# Patient Record
Sex: Female | Born: 1980
Health system: Southern US, Community
[De-identification: ages and names within clinical notes are randomized; demographics above are authoritative.]

## PROBLEM LIST (undated history)

## (undated) DIAGNOSIS — D649 Anemia, unspecified: Secondary | ICD-10-CM

## (undated) DIAGNOSIS — F32A Depression, unspecified: Secondary | ICD-10-CM

## (undated) DIAGNOSIS — I10 Essential (primary) hypertension: Secondary | ICD-10-CM

## (undated) DIAGNOSIS — N871 Moderate cervical dysplasia: Secondary | ICD-10-CM

## (undated) DIAGNOSIS — R87629 Unspecified abnormal cytological findings in specimens from vagina: Secondary | ICD-10-CM

## (undated) DIAGNOSIS — A64 Unspecified sexually transmitted disease: Secondary | ICD-10-CM

## (undated) DIAGNOSIS — F329 Major depressive disorder, single episode, unspecified: Secondary | ICD-10-CM

## (undated) HISTORY — DX: Essential (primary) hypertension: I10

## (undated) HISTORY — DX: Major depressive disorder, single episode, unspecified: F32.9

## (undated) HISTORY — PX: TUBAL LIGATION: SHX77

## (undated) HISTORY — DX: Unspecified sexually transmitted disease: A64

## (undated) HISTORY — DX: Moderate cervical dysplasia: N87.1

## (undated) HISTORY — DX: Depression, unspecified: F32.A

## (undated) HISTORY — DX: Unspecified abnormal cytological findings in specimens from vagina: R87.629

---
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
## 2001-05-26 ENCOUNTER — Emergency Department (HOSPITAL_COMMUNITY): Admission: EM | Admit: 2001-05-26 | Discharge: 2001-05-26 | Payer: Self-pay | Admitting: Emergency Medicine

## 2001-09-26 ENCOUNTER — Other Ambulatory Visit: Admission: RE | Admit: 2001-09-26 | Discharge: 2001-09-26 | Payer: Self-pay | Admitting: Internal Medicine

## 2002-07-24 ENCOUNTER — Emergency Department (HOSPITAL_COMMUNITY): Admission: EM | Admit: 2002-07-24 | Discharge: 2002-07-24 | Payer: Self-pay | Admitting: Emergency Medicine

## 2002-12-13 ENCOUNTER — Emergency Department (HOSPITAL_COMMUNITY): Admission: EM | Admit: 2002-12-13 | Discharge: 2002-12-14 | Payer: Self-pay | Admitting: Emergency Medicine

## 2002-12-14 ENCOUNTER — Ambulatory Visit (HOSPITAL_COMMUNITY): Admission: RE | Admit: 2002-12-14 | Discharge: 2002-12-14 | Payer: Self-pay | Admitting: Emergency Medicine

## 2002-12-14 ENCOUNTER — Encounter: Payer: Self-pay | Admitting: Emergency Medicine

## 2002-12-17 ENCOUNTER — Inpatient Hospital Stay (HOSPITAL_COMMUNITY): Admission: AD | Admit: 2002-12-17 | Discharge: 2002-12-17 | Payer: Self-pay | Admitting: Obstetrics and Gynecology

## 2002-12-31 ENCOUNTER — Emergency Department (HOSPITAL_COMMUNITY): Admission: EM | Admit: 2002-12-31 | Discharge: 2002-12-31 | Payer: Self-pay | Admitting: Emergency Medicine

## 2005-05-06 ENCOUNTER — Emergency Department (HOSPITAL_COMMUNITY): Admission: EM | Admit: 2005-05-06 | Discharge: 2005-05-07 | Payer: Self-pay | Admitting: *Deleted

## 2005-07-26 ENCOUNTER — Emergency Department (HOSPITAL_COMMUNITY): Admission: EM | Admit: 2005-07-26 | Discharge: 2005-07-26 | Payer: Self-pay | Admitting: Emergency Medicine

## 2006-03-04 ENCOUNTER — Observation Stay (HOSPITAL_COMMUNITY): Admission: AD | Admit: 2006-03-04 | Discharge: 2006-03-05 | Payer: Self-pay | Admitting: Obstetrics and Gynecology

## 2006-03-09 ENCOUNTER — Inpatient Hospital Stay (HOSPITAL_COMMUNITY): Admission: AD | Admit: 2006-03-09 | Discharge: 2006-03-11 | Payer: Self-pay | Admitting: Obstetrics and Gynecology

## 2006-07-09 DIAGNOSIS — N871 Moderate cervical dysplasia: Secondary | ICD-10-CM

## 2006-07-09 HISTORY — DX: Moderate cervical dysplasia: N87.1

## 2006-10-08 ENCOUNTER — Other Ambulatory Visit: Admission: RE | Admit: 2006-10-08 | Discharge: 2006-10-08 | Payer: Self-pay | Admitting: Unknown Physician Specialty

## 2006-10-08 ENCOUNTER — Encounter (INDEPENDENT_AMBULATORY_CARE_PROVIDER_SITE_OTHER): Payer: Self-pay | Admitting: Specialist

## 2008-04-07 ENCOUNTER — Emergency Department (HOSPITAL_COMMUNITY): Admission: EM | Admit: 2008-04-07 | Discharge: 2008-04-07 | Payer: Self-pay | Admitting: Emergency Medicine

## 2010-11-24 NOTE — H&P (Signed)
NAME:  Robin Delgado, Robin Delgado         ACCOUNT NO.:  [id number]   MEDICAL RECORD NO.:  [id number]          PATIENT TYPE:  INP   LOCATION:  LDR4                          FACILITY:  [facility]   PHYSICIAN:  Tilda Burrow, M.D. DATE OF BIRTH:  1980/08/24   DATE OF ADMISSION:  03/09/2006  DATE OF DISCHARGE:  LH                                HISTORY & PHYSICAL   ADMITTING DIAGNOSES:  1. Pregnancy, term.  2. Active labor.   HISTORY OF PRESENT ILLNESS:  This 30 year old female, gravida 5, para 2, AB  0, LMP June 22, 2005, placing this lady's [facility] March 30, 2006 by  Ultrasound _criteria suggesting an [redacted] weeks gestation, suggesting March 21, 2006 as [facility].  She is admitted after pregnancy course followed through at  [facility] since February with an uncomplicated prenatal course.  She has chosen to present to the [facility] for delivery.   PRIOR OBSTETRICAL HISTORY:  1. Term vaginal delivery at [facility].  2. Term delivery at [facility] by vacuum extraction.  3. She has had 2 miscarriages.   She presents on the morning of March 09, 2006, contracting on a regular  basis with cervical exam 5-cm dilated, completely effaced, -2 station by  nursing evaluation.  Fetal heart rate is a reactive pattern.   IMPRESSION:  1. Pregnancy, term.  2. Active labor.   PROGNOSIS:  Vaginal delivery.   ADDENDUM LABORATORY:  Blood type B positive.  Antibody screen negative.  Hemoglobin 11, hematocrit 34.  Hepatitis, HIV, RPR, GC, and Chlamydia:  RPR  negative.  GC was negative.  Chlamydia was negative in January,  subsequently positive in June, treated on December 26, 2005.  MS AFP was  negative.  Group B strep is not noted here.  Efforts will be made to  identify these results.   PLAN:  Admit for labor management with probable vaginal delivery this a.m.      Tilda Burrow, M.D.  Electronically Signed     JVF/MEDQ  D:  03/09/2006  T:  03/09/2006  Job:   [id number]

## 2010-11-24 NOTE — H&P (Signed)
NAME:  Robin Delgado, Robin Delgado         ACCOUNT NO.:  [id number]   MEDICAL RECORD NO.:  [id number]          PATIENT TYPE:  OIB   LOCATION:  LDR2                          FACILITY:  [facility]   PHYSICIAN:  Tilda Burrow, M.D. DATE OF BIRTH:  08-11-1980   DATE OF ADMISSION:  03/04/2006  DATE OF DISCHARGE:  LH                                HISTORY & PHYSICAL   OBSERVATION ADMISSION:   DATE OF OBSERVATION ADMISSION:  03/04/2006 at 2245.   ADMITTING DIAGNOSIS:  Pregnancy at 37 weeks, abdominal pain, uterine  contraction.   HISTORY OF PRESENT ILLNESS:  [name] is a 30 year old multip patient who is  presently going to [facility], but is transferring over to [facility], because of uterine contractions and abdominal pain.  The patient was  observed overnight with arrest of contractions and patient slept most of the  night.  She is to be discharged home to follow up with [place] this morning in  the office to get a detailed history of her prenatal course and her records  from Dr. Andres Labrum.      Zerita Boers, Lanier Clam      Tilda Burrow, M.D.  Electronically Signed    DL/MEDQ  D:  96/[id number]/5284  T:  03/05/2006  Job:  [id number]

## 2010-11-24 NOTE — Op Note (Signed)
NAME:  DELMY, HOLDREN         ACCOUNT NO.:  [id number]   MEDICAL RECORD NO.:  [id number]          PATIENT TYPE:  INP   LOCATION:  A411                          FACILITY:  [facility]   PHYSICIAN:  Tilda Burrow, M.D. DATE OF BIRTH:  [birth year]   DATE OF PROCEDURE:  DATE OF DISCHARGE:                                 OPERATIVE REPORT   DELIVERY TIME:  11:07 a.m.  Dr Milford Cage   LABOR SUMMARY/DELIVERY NOTE:  Ms. Macbride progressed rapidly in labor.  She reached completely dilated with an irresistible urge to push and reached  crowning position.  I was completing a cesarean section delivery, with the  prior infant being born shortly before 11:00.  Dr. Milford Cage was in attendance  with the delivery.  She pushed and delivered easily over an intact perineum,  delivering a healthy 7 pound 14.3 ounce female infant with Apgars 9 and 9.  I  arrived 2 minutes after delivery, obtained cord blood samples.  Placenta  delivered intact, Schultze presentation.  Cord insertion was a marginal  insertion.  Three vessel cord was confirmed.  Cord blood samples were  obtained and are pending at the time of this dictation.  Membranes appeared  to be intact.  Patient then went on to have the IV infiltrate so we gave her  10 units of Pitocin IM with minimal postpartum blood loss.      Tilda Burrow, M.D.  Electronically Signed     JVF/MEDQ  D:  03/09/2006  T:  03/11/2006  Job:  [id number]   cc:   Francoise Schaumann. Milford Cage DO, FAAP  Fax: [phone]

## 2012-08-12 ENCOUNTER — Encounter: Payer: Self-pay | Admitting: Family Medicine

## 2012-08-12 ENCOUNTER — Ambulatory Visit (INDEPENDENT_AMBULATORY_CARE_PROVIDER_SITE_OTHER): Payer: BC Managed Care – PPO | Admitting: Family Medicine

## 2012-08-12 VITALS — BP 122/82 | HR 63 | Resp 16 | Ht 64.0 in | Wt 113.0 lb

## 2012-08-12 DIAGNOSIS — Z13220 Encounter for screening for lipoid disorders: Secondary | ICD-10-CM

## 2012-08-12 DIAGNOSIS — F329 Major depressive disorder, single episode, unspecified: Secondary | ICD-10-CM

## 2012-08-12 DIAGNOSIS — Z130 Encounter for screening for diseases of the blood and blood-forming organs and certain disorders involving the immune mechanism: Secondary | ICD-10-CM

## 2012-08-12 DIAGNOSIS — Z13228 Encounter for screening for other metabolic disorders: Secondary | ICD-10-CM

## 2012-08-12 DIAGNOSIS — Z1329 Encounter for screening for other suspected endocrine disorder: Secondary | ICD-10-CM

## 2012-08-12 DIAGNOSIS — R21 Rash and other nonspecific skin eruption: Secondary | ICD-10-CM

## 2012-08-12 DIAGNOSIS — G4700 Insomnia, unspecified: Secondary | ICD-10-CM

## 2012-08-12 DIAGNOSIS — Z1321 Encounter for screening for nutritional disorder: Secondary | ICD-10-CM

## 2012-08-12 DIAGNOSIS — R634 Abnormal weight loss: Secondary | ICD-10-CM

## 2012-08-12 LAB — CBC WITH DIFFERENTIAL/PLATELET
Basophils Absolute: 0 10*3/uL (ref 0.0–0.1)
Basophils Relative: 0 % (ref 0–1)
Eosinophils Absolute: 0.2 10*3/uL (ref 0.0–0.7)
Eosinophils Relative: 2 % (ref 0–5)
HCT: 37.6 % (ref 36.0–46.0)
Hemoglobin: 11.8 g/dL — ABNORMAL LOW (ref 12.0–15.0)
Lymphocytes Relative: 37 % (ref 12–46)
Lymphs Abs: 2.7 10*3/uL (ref 0.7–4.0)
MCH: 23.1 pg — ABNORMAL LOW (ref 26.0–34.0)
MCHC: 31.4 g/dL (ref 30.0–36.0)
MCV: 73.6 fL — ABNORMAL LOW (ref 78.0–100.0)
Monocytes Absolute: 0.4 10*3/uL (ref 0.1–1.0)
Monocytes Relative: 6 % (ref 3–12)
Neutro Abs: 3.9 10*3/uL (ref 1.7–7.7)
Neutrophils Relative %: 55 % (ref 43–77)
Platelets: 319 10*3/uL (ref 150–400)
RBC: 5.11 MIL/uL (ref 3.87–5.11)
RDW: 15.9 % — ABNORMAL HIGH (ref 11.5–15.5)
WBC: 7.2 10*3/uL (ref 4.0–10.5)

## 2012-08-12 LAB — COMPREHENSIVE METABOLIC PANEL
ALT: 9 U/L (ref 0–35)
AST: 13 U/L (ref 0–37)
Albumin: 4 g/dL (ref 3.5–5.2)
Alkaline Phosphatase: 57 U/L (ref 39–117)
BUN: 9 mg/dL (ref 6–23)
CO2: 23 mEq/L (ref 19–32)
Calcium: 9.2 mg/dL (ref 8.4–10.5)
Chloride: 107 mEq/L (ref 96–112)
Creat: 0.7 mg/dL (ref 0.50–1.10)
Glucose, Bld: 82 mg/dL (ref 70–99)
Potassium: 4 mEq/L (ref 3.5–5.3)
Sodium: 140 mEq/L (ref 135–145)
Total Bilirubin: 0.3 mg/dL (ref 0.3–1.2)
Total Protein: 7.2 g/dL (ref 6.0–8.3)

## 2012-08-12 LAB — LIPID PANEL
Cholesterol: 150 mg/dL (ref 0–200)
HDL: 54 mg/dL (ref >39)
LDL Cholesterol: 84 mg/dL (ref 0–99)
Total CHOL/HDL Ratio: 2.8 Ratio
Triglycerides: 62 mg/dL (ref <150)
VLDL: 12 mg/dL (ref 0–40)

## 2012-08-12 LAB — TSH: TSH: 0.952 u[IU]/mL (ref 0.350–4.500)

## 2012-08-12 MED ORDER — TRAZODONE HCL 50 MG PO TABS: 50.0000 mg | ORAL_TABLET | Freq: Every day | ORAL | Status: DC

## 2012-08-12 MED ORDER — CLOTRIMAZOLE-BETAMETHASONE 1-0.05 % EX CREA: TOPICAL_CREAM | CUTANEOUS | Status: AC

## 2012-08-12 NOTE — Patient Instructions (Signed)
Take a gummy vitamin Try the trazodone at bedtime- take 1 hour before sleep  Cream for your foot twice a day until clear Referral to psychiatry/therapy  I will get records Get the labs done today  F/U 3 months

## 2012-08-12 NOTE — Progress Notes (Signed)
  Subjective:    Patient ID: Robin Delgado, female    DOB: [birth year], 32 y.o.   MRN: [id number]  HPI Patient here to establish care. Previous PCP [facility] department. Medications and history reviewed She's concerned about her nerves and her mood. She's been losing weight unintentionally because her appetite is low she does not sleep well and things are difficult at home. She has 3 children ages 47, 60, 66. She and her father do not get along however he is staying at the home. Her mother helps her some with the kids after school however she feels like she is doing everything on her own. 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 Her middle son has ADHD as well as behavioral problems and difficulty sleeping. They tend to eat a lot of fast food and junk food and they have no structure in the home. The children are up all throughout the night therefore she goes to bed late and they get very early. She was treated for depression about 4 years ago however cannot recall the medication. She was recently seen at the health department for her annual exam and Pap smear was done she currently uses birth control patches. She's also noticed a rash that comes up on both feet has been present for many months she has not used any medication for this it does not itch there is no pain.   Review of Systems   GEN- denies fatigue, fever, +weight loss,weakness, recent illness HEENT- denies eye drainage, change in vision, nasal discharge, CVS- denies chest pain, palpitations RESP- denies SOB, cough, wheeze ABD- denies N/V, change in stools, abd pain GU- denies dysuria, hematuria, dribbling, incontinence MSK- denies joint pain, muscle aches, injury Neuro- denies headache, dizziness, syncope, seizure activity      Objective:   Physical Exam  GEN- NAD, alert and oriented x3, very fatigued appearing HEENT- PERRL, EOMI, non injected sclera, pink conjunctiva, MMM, oropharynx clear Neck-  Supple, no thryomegaly CVS- RRR, no murmur RESP-CTAB ABD-NABS,soft,NND EXT- No edema Pulses- Radial, DP- 2+ Psych- well groomed, good eye contact, not overly depressed, not anxious appearing, no hallucinations,no SI, normal speech  Skin- few scattered hyperpigmented spots on bilateral feet, no erythema, dry patches     Assessment & Plan:

## 2012-08-13 DIAGNOSIS — R21 Rash and other nonspecific skin eruption: Secondary | ICD-10-CM | POA: Insufficient documentation

## 2012-08-13 DIAGNOSIS — F3130 Bipolar disorder, current episode depressed, mild or moderate severity, unspecified: Secondary | ICD-10-CM | POA: Insufficient documentation

## 2012-08-13 DIAGNOSIS — R634 Abnormal weight loss: Secondary | ICD-10-CM | POA: Insufficient documentation

## 2012-08-13 DIAGNOSIS — G4700 Insomnia, unspecified: Secondary | ICD-10-CM | POA: Insufficient documentation

## 2012-08-13 NOTE — Assessment & Plan Note (Signed)
possible fungal, lotrisone BID

## 2012-08-13 NOTE — Assessment & Plan Note (Signed)
Her symptoms all point towards depression with her current situation, poor sleep, weight loss, difficulty with children, stress. She asked for therapy and counselor, this would be very beneficial, will also have her see the psychiatrist.  Will start her on trazodone for now

## 2012-08-13 NOTE — Assessment & Plan Note (Addendum)
Her weight is actually within normal range, but she has lost weight due to her depression Fasting labs to be done, check TSH

## 2012-08-13 NOTE — Assessment & Plan Note (Signed)
Trial of trazodone. 

## 2012-08-15 LAB — VITAMIN D 1,25 DIHYDROXY
Vitamin D 1, 25 (OH)2 Total: 64 pg/mL (ref 18–72)
Vitamin D2 1, 25 (OH)2: <8 pg/mL
Vitamin D3 1, 25 (OH)2: 64 pg/mL

## 2012-08-25 ENCOUNTER — Encounter: Payer: Self-pay | Admitting: Family Medicine

## 2012-08-27 ENCOUNTER — Ambulatory Visit (HOSPITAL_COMMUNITY): Payer: BC Managed Care – PPO | Admitting: Psychiatry

## 2012-09-12 ENCOUNTER — Ambulatory Visit (HOSPITAL_COMMUNITY): Payer: BC Managed Care – PPO | Admitting: Psychiatry

## 2012-09-16 ENCOUNTER — Encounter (HOSPITAL_COMMUNITY): Payer: Self-pay | Admitting: Psychiatry

## 2012-09-16 ENCOUNTER — Ambulatory Visit (INDEPENDENT_AMBULATORY_CARE_PROVIDER_SITE_OTHER): Payer: BC Managed Care – PPO | Admitting: Psychiatry

## 2012-09-16 DIAGNOSIS — F331 Major depressive disorder, recurrent, moderate: Secondary | ICD-10-CM

## 2012-09-16 NOTE — Patient Instructions (Signed)
Discussed orally 
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
## 2012-09-17 ENCOUNTER — Ambulatory Visit (INDEPENDENT_AMBULATORY_CARE_PROVIDER_SITE_OTHER): Payer: BC Managed Care – PPO | Admitting: Psychiatry

## 2012-09-17 ENCOUNTER — Encounter (HOSPITAL_COMMUNITY): Payer: Self-pay | Admitting: Psychiatry

## 2012-09-17 VITALS — Wt 110.0 lb

## 2012-09-17 DIAGNOSIS — F411 Generalized anxiety disorder: Secondary | ICD-10-CM

## 2012-09-17 DIAGNOSIS — F429 Obsessive-compulsive disorder, unspecified: Secondary | ICD-10-CM

## 2012-09-17 DIAGNOSIS — F3130 Bipolar disorder, current episode depressed, mild or moderate severity, unspecified: Secondary | ICD-10-CM

## 2012-09-17 DIAGNOSIS — F329 Major depressive disorder, single episode, unspecified: Secondary | ICD-10-CM

## 2012-09-17 DIAGNOSIS — G4700 Insomnia, unspecified: Secondary | ICD-10-CM

## 2012-09-17 MED ORDER — CARBAMAZEPINE ER 100 MG PO TB12: ORAL_TABLET | ORAL | Status: DC
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
## 2012-09-17 NOTE — Patient Instructions (Addendum)
CUT BACK/CUT OUT on sugar and carbohydrates, that means very limited fruits and starchy vegetables and very limited grains, breads  The goal is low GLYCEMIC INDEX.  CUT OUT all wheat, rye, or barley for the GLUTEN in them.  HIGH fat and LOW carbohydrate diet is the KEY.  Eat avocados, eggs, lean meat like grass fed beef and chicken  Nuts and seeds would be good foods as well.   Stevia is an excellent sweetener.  Safe for the brain.   Lowella Grip is also a good safe sweetener.  Almond butter is awesome.  Check out all this on the Internet.  Dr Heber  is on the Internet with some good info about this.   Cooking on an iron skillet and taking 1 tablespoon of Black Strap molasses a day may be helpful for the anemia.   "Native Wisdom for [employer] by Camelia Phenes could be very helpful.  Get labs at least 7 to 10 days after on stable dose of 2 or even 3 Tegretol at night.  Call if problems or concerns.

## 2012-09-24 ENCOUNTER — Ambulatory Visit (HOSPITAL_COMMUNITY): Payer: Self-pay | Admitting: Psychiatry

## 2012-09-26 ENCOUNTER — Telehealth: Payer: Self-pay | Admitting: Family Medicine

## 2012-09-26 DIAGNOSIS — F319 Bipolar disorder, unspecified: Secondary | ICD-10-CM

## 2012-09-26 DIAGNOSIS — F411 Generalized anxiety disorder: Secondary | ICD-10-CM

## 2012-09-29 NOTE — Telephone Encounter (Signed)
New referral placed.

## 2012-10-09 ENCOUNTER — Encounter: Payer: Self-pay | Admitting: Family Medicine

## 2012-10-09 ENCOUNTER — Ambulatory Visit (INDEPENDENT_AMBULATORY_CARE_PROVIDER_SITE_OTHER): Payer: BC Managed Care – PPO | Admitting: Family Medicine

## 2012-10-09 VITALS — BP 110/72 | HR 88 | Resp 16 | Wt 113.8 lb

## 2012-10-09 DIAGNOSIS — G4700 Insomnia, unspecified: Secondary | ICD-10-CM

## 2012-10-09 DIAGNOSIS — F411 Generalized anxiety disorder: Secondary | ICD-10-CM

## 2012-10-09 DIAGNOSIS — F3130 Bipolar disorder, current episode depressed, mild or moderate severity, unspecified: Secondary | ICD-10-CM

## 2012-10-09 MED ORDER — LORAZEPAM 0.5 MG PO TABS: 0.5000 mg | ORAL_TABLET | Freq: Two times a day (BID) | ORAL | Status: DC | PRN

## 2012-10-09 NOTE — Patient Instructions (Signed)
Use the ativan as needed  Referral to new psychiatrist Start the birth control pills F/U 2 months

## 2012-10-10 ENCOUNTER — Encounter: Payer: Self-pay | Admitting: Family Medicine

## 2012-10-10 NOTE — Assessment & Plan Note (Signed)
She does not agree with diagnosis, I tried to discuss bipolar with her. She agrees to see a new psychiatrist. She does not want to start anything long term until she speaks to a psychiatrist
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
## 2012-10-10 NOTE — Assessment & Plan Note (Signed)
Will give her short term supply of ativan for anxiety and sleep, she agrees to see a new therapist

## 2012-10-10 NOTE — Assessment & Plan Note (Signed)
Per above 

## 2012-11-11 ENCOUNTER — Ambulatory Visit (INDEPENDENT_AMBULATORY_CARE_PROVIDER_SITE_OTHER): Payer: BC Managed Care – PPO | Admitting: Family Medicine

## 2012-11-11 ENCOUNTER — Encounter: Payer: Self-pay | Admitting: Family Medicine

## 2012-11-11 VITALS — BP 100/70 | HR 74 | Resp 16 | Wt 109.8 lb

## 2012-11-11 DIAGNOSIS — R634 Abnormal weight loss: Secondary | ICD-10-CM

## 2012-11-11 DIAGNOSIS — F411 Generalized anxiety disorder: Secondary | ICD-10-CM

## 2012-11-11 DIAGNOSIS — D509 Iron deficiency anemia, unspecified: Secondary | ICD-10-CM

## 2012-11-11 DIAGNOSIS — F3130 Bipolar disorder, current episode depressed, mild or moderate severity, unspecified: Secondary | ICD-10-CM

## 2012-11-11 LAB — CBC
HCT: 37.2 % (ref 36.0–46.0)
Hemoglobin: 11.6 g/dL — ABNORMAL LOW (ref 12.0–15.0)
MCH: 22.8 pg — ABNORMAL LOW (ref 26.0–34.0)
MCHC: 31.2 g/dL (ref 30.0–36.0)
MCV: 73.1 fL — ABNORMAL LOW (ref 78.0–100.0)
Platelets: 271 10*3/uL (ref 150–400)
RBC: 5.09 MIL/uL (ref 3.87–5.11)
RDW: 15.4 % (ref 11.5–15.5)
WBC: 7.1 10*3/uL (ref 4.0–10.5)

## 2012-11-11 LAB — BASIC METABOLIC PANEL
BUN: 10 mg/dL (ref 6–23)
CO2: 26 mEq/L (ref 19–32)
Calcium: 9.3 mg/dL (ref 8.4–10.5)
Chloride: 105 mEq/L (ref 96–112)
Creat: 0.75 mg/dL (ref 0.50–1.10)
Glucose, Bld: 89 mg/dL (ref 70–99)
Potassium: 4.6 mEq/L (ref 3.5–5.3)
Sodium: 139 mEq/L (ref 135–145)

## 2012-11-11 LAB — IRON AND TIBC
%SAT: 35 % (ref 20–55)
Iron: 137 ug/dL (ref 42–145)
TIBC: 387 ug/dL (ref 250–470)
UIBC: 250 ug/dL (ref 125–400)

## 2012-11-11 MED ORDER — MIRTAZAPINE 15 MG PO TABS: 15.0000 mg | ORAL_TABLET | Freq: Every day | ORAL | Status: DC

## 2012-11-11 MED ORDER — MIRTAZAPINE 15 MG PO TABS: 7.5000 mg | ORAL_TABLET | Freq: Every day | ORAL | Status: DC

## 2012-11-11 NOTE — Patient Instructions (Signed)
Start remeron at bedtime Get the labs done Your thyroid level is normal Call us on Thursday if you have not heard back about her psychiatry appointment Keep previous followup

## 2012-11-11 NOTE — Assessment & Plan Note (Addendum)
remeron for appetite, mild anti-depressant Psychiatry referral, no SI Declines anti-psychotics, see previous notes

## 2012-11-11 NOTE — Assessment & Plan Note (Signed)
Recheck CBC with iron stores, she is not taking supplement

## 2012-11-11 NOTE — Assessment & Plan Note (Signed)
Uncontrolled, we are following up on psychiatry referral, she rarely uses the ativan

## 2012-11-11 NOTE — Progress Notes (Signed)
  Subjective:    Patient ID: Robin Delgado, female    DOB: [birth year], 32 y.o.   MRN: [id number]  HPI Patient here to followup her anxiety and bipolar depression. She did not hear back from her psychiatry referral. She states she is losing weight again she's been trying to drink ensure she's been drinking as 4-5 times a day. She knows it is due to stress test when she gets home she was her appetite gets angry with her children's father. The situation at home is not improved and he is not helping with the children or with the bills. Her family is worried about her states she gets pale in the face and looks sad and depressed. She denies any pain anywhere. She denies any hallucinations or suicidal ideations. She rarely uses the Ativan as she is afraid of side effects.   Review of Systems   GEN- denies fatigue, fever, weight loss,weakness, recent illness HEENT- denies eye drainage, change in vision, nasal discharge, CVS- denies chest pain, palpitations RESP- denies SOB, cough, wheeze ABD- denies N/V, change in stools, abd pain GU- denies dysuria, hematuria, dribbling, incontinence MSK- denies joint pain, muscle aches, injury Neuro- denies headache, dizziness, syncope, seizure activity      Objective:   Physical Exam GEN- NAD, alert and oriented x3, weight loss 4lbs HEENT- PERRL, EOMI, non injected sclera, pink conjunctiva, MMM, oropharynx clear Neck- Supple, no thyromegaly CVS- RRR, no murmur RESP-CTAB ABD-NABS,soft,NT,ND EXT- No edema Pulses- Radial 2+ Psych- stressed appearing, not overly anxious, depressed appearing, no SI, no hallucinations, good eye contact today        Assessment & Plan:

## 2012-11-11 NOTE — Assessment & Plan Note (Signed)
Reviewed labs with pt, mild anemia, TSH normal Due to stress, depression, she is not eating regular meals drinking ensure for the most part Start remeron at bedtime, start 7.5mg  x 1 week, increase to 15mg 

## 2012-12-08 ENCOUNTER — Telehealth: Payer: Self-pay | Admitting: Family Medicine

## 2012-12-08 NOTE — Telephone Encounter (Signed)
noted 

## 2012-12-31 ENCOUNTER — Ambulatory Visit (INDEPENDENT_AMBULATORY_CARE_PROVIDER_SITE_OTHER): Payer: BC Managed Care – PPO | Admitting: Family Medicine

## 2012-12-31 ENCOUNTER — Encounter: Payer: Self-pay | Admitting: Family Medicine

## 2012-12-31 VITALS — BP 110/68 | HR 68 | Temp 97.6°F | Resp 18 | Ht 64.0 in | Wt 122.0 lb

## 2012-12-31 DIAGNOSIS — G4700 Insomnia, unspecified: Secondary | ICD-10-CM

## 2012-12-31 DIAGNOSIS — Z833 Family history of diabetes mellitus: Secondary | ICD-10-CM

## 2012-12-31 DIAGNOSIS — R634 Abnormal weight loss: Secondary | ICD-10-CM

## 2012-12-31 DIAGNOSIS — F3130 Bipolar disorder, current episode depressed, mild or moderate severity, unspecified: Secondary | ICD-10-CM

## 2012-12-31 LAB — HEMOGLOBIN A1C, FINGERSTICK: Hgb A1C (fingerstick): 5.2 % (ref <5.7)

## 2012-12-31 NOTE — Patient Instructions (Signed)
Try Benzyol peroxide 5% over the counter for acne Continue remeron  F/U 3 months

## 2013-01-01 DIAGNOSIS — Z833 Family history of diabetes mellitus: Secondary | ICD-10-CM | POA: Insufficient documentation

## 2013-01-01 NOTE — Assessment & Plan Note (Signed)
Pt very concerned, even though I discussed her previous labs,she has no risk factors for DM, no signs of this, she still wanted test done A1C done and normal at 5.2%

## 2013-01-01 NOTE — Assessment & Plan Note (Signed)
Improved with remeron 

## 2013-01-01 NOTE — Assessment & Plan Note (Signed)
Her sleep and weight has improved as expected with the remeron but her symptoms are still not controlled, still has anxiety and anger issues, she declines any other medications, she declines seeing psychiatry for second opnion which I think she would benefit from

## 2013-01-01 NOTE — Progress Notes (Signed)
  Subjective:    Patient ID: Robin Delgado, female    DOB: 1980-09-15, 32 y.o.   MRN: [id number]  HPI  Pt here for Diabetes check, her uncles have DM and she has been very concerned and wants A1C done. Has had normal fasting sugars. She is doing well on remeron, she is happy her weight is going up, wants to be 130lbs, appetite is much improved, thinks she sleeps better as well. Is still irritable and anxious but feels better. Does not want to see a psychiatrist.   Review of Systems - per above  GEN- denies fatigue, fever, weight loss,weakness, recent illness HEENT- denies eye drainage, change in vision, nasal discharge, CVS- denies chest pain, palpitations RESP- denies SOB, cough, wheeze ABD- denies N/V, change in stools, abd pain Neuro- denies headache, dizziness, syncope, seizure activity Endo- denies polyuria, polydipsia, polyphagia      Objective:   Physical Exam GEN- NAD, alert and oriented x3, +weight gain HEENT- PERRL, EOMI, MMM, oropharynx clear CVS- RRR, no murmur RESP-CTAB EXT- No edema Pulses- Radial 2+ Psych- normal affect and mood        Assessment & Plan:

## 2013-01-01 NOTE — Assessment & Plan Note (Signed)
improved

## 2013-01-07 ENCOUNTER — Ambulatory Visit: Payer: Self-pay | Admitting: Family Medicine

## 2013-02-12 ENCOUNTER — Telehealth: Payer: Self-pay | Admitting: Family Medicine

## 2013-02-12 NOTE — Telephone Encounter (Signed)
Ok to refill 

## 2013-02-13 ENCOUNTER — Telehealth: Payer: Self-pay | Admitting: Family Medicine

## 2013-02-13 ENCOUNTER — Ambulatory Visit: Payer: BC Managed Care – PPO | Admitting: Family Medicine

## 2013-02-13 NOTE — Telephone Encounter (Signed)
LMTRC

## 2013-02-13 NOTE — Telephone Encounter (Signed)
No she must come in for an appointment, this is not a medication she is being prescribed.

## 2013-02-13 NOTE — Telephone Encounter (Signed)
She should not take anymore of the zoloft, advise her not to take any medications that are not prescribed for her Drink plenty of fluids This should improve as medication clears If she gets worse go to ER

## 2013-02-13 NOTE — Telephone Encounter (Signed)
Pt called back and aware of message. °

## 2013-02-13 NOTE — Telephone Encounter (Signed)
Pt aware of message

## 2013-02-20 ENCOUNTER — Telehealth: Payer: Self-pay | Admitting: Family Medicine

## 2013-02-20 DIAGNOSIS — D509 Iron deficiency anemia, unspecified: Secondary | ICD-10-CM

## 2013-02-20 MED ORDER — MIRTAZAPINE 15 MG PO TABS: 15.0000 mg | ORAL_TABLET | Freq: Every day | ORAL | Status: DC

## 2013-02-20 NOTE — Telephone Encounter (Signed)
Medication refilled

## 2013-02-20 NOTE — Telephone Encounter (Signed)
?   Ok to refill;last refilll 01/11/13

## 2013-02-20 NOTE — Telephone Encounter (Signed)
Mirtazapine 15 mg tab 1 QHS #30

## 2013-03-04 ENCOUNTER — Encounter: Payer: Self-pay | Admitting: Family Medicine

## 2013-03-04 ENCOUNTER — Ambulatory Visit (INDEPENDENT_AMBULATORY_CARE_PROVIDER_SITE_OTHER): Payer: BC Managed Care – PPO | Admitting: Family Medicine

## 2013-03-04 VITALS — BP 108/62 | HR 78 | Temp 99.1°F | Resp 16 | Wt 134.0 lb

## 2013-03-04 DIAGNOSIS — L708 Other acne: Secondary | ICD-10-CM

## 2013-03-04 DIAGNOSIS — L700 Acne vulgaris: Secondary | ICD-10-CM

## 2013-03-04 MED ORDER — BENZOYL PEROXIDE 5 % EX GEL: Freq: Every day | CUTANEOUS | Status: DC

## 2013-03-04 MED ORDER — DOXYCYCLINE HYCLATE 100 MG PO TABS: 100.0000 mg | ORAL_TABLET | Freq: Every day | ORAL | Status: DC

## 2013-03-04 NOTE — Assessment & Plan Note (Signed)
Start doxycyline daily Benzyl peroxide Wash face daily, light moisturizer Recheck 1 month

## 2013-03-04 NOTE — Patient Instructions (Addendum)
Start doxycyline take 1 tablet daily Use the benzaclin F/u in September as scheduled

## 2013-03-04 NOTE — Progress Notes (Signed)
  Subjective:    Patient ID: Robin Delgado, female    DOB: Dec 06, 1980, 32 y.o.   MRN: [id number]  HPI  Pt here with break-ou ton face for past few weeks, noticed white bumps all over face, did not coincide with menses. She denies any pain, itching or fever. She has popped some with use of wash cloth and they left dark marks.   Review of Systems - per above  GEN- denies fatigue, fever, weight loss,weakness, recent illness HEENT- denies eye drainage, change in vision, nasal discharge, Skin-+ rash      Objective:   Physical Exam GEN- NAD, alert and oriented x 3 Skin- small pustular acne bilateral cheeks, chin and forehead, nose area spared, mild erythema at base of acne. No vesicles. Post inflammatory scarring scattered       Assessment & Plan:

## 2013-03-05 ENCOUNTER — Telehealth: Payer: Self-pay | Admitting: Family Medicine

## 2013-03-05 NOTE — Telephone Encounter (Signed)
Seen yesterday.  Medicine ordered for her face is too expensive.  She wants to use Proactive??  Wants your advise on that?

## 2013-03-06 NOTE — Telephone Encounter (Signed)
Called patient.  Stated both meds expensive because she has not met her insurance deductible yet.  Told her Dr Jeanice Lim needs her to take oral antibiotic.  If she wants to try an OTC topical she can but not to expect immediate results, it can take time for face to clear.  Reinforced DO take oral antibiotics as Dr. Jeanice Lim prescribed.

## 2013-03-06 NOTE — Telephone Encounter (Signed)
See which medication was too expense, was it the antibiotic or the cream She needs to take the antibiotic She can get the over the counter form of the cream, nutragena and other brands make the benzoyl peroxide She can try proactive, but it wont clear up the pustules that quickly , that takes time

## 2013-03-13 ENCOUNTER — Ambulatory Visit (INDEPENDENT_AMBULATORY_CARE_PROVIDER_SITE_OTHER): Payer: BC Managed Care – PPO | Admitting: Family Medicine

## 2013-03-13 VITALS — BP 110/60 | HR 78 | Temp 98.6°F | Resp 20 | Wt 140.0 lb

## 2013-03-13 DIAGNOSIS — N949 Unspecified condition associated with female genital organs and menstrual cycle: Secondary | ICD-10-CM

## 2013-03-13 DIAGNOSIS — N898 Other specified noninflammatory disorders of vagina: Secondary | ICD-10-CM

## 2013-03-13 DIAGNOSIS — N925 Other specified irregular menstruation: Secondary | ICD-10-CM

## 2013-03-13 DIAGNOSIS — N938 Other specified abnormal uterine and vaginal bleeding: Secondary | ICD-10-CM

## 2013-03-13 DIAGNOSIS — N760 Acute vaginitis: Secondary | ICD-10-CM

## 2013-03-13 DIAGNOSIS — K047 Periapical abscess without sinus: Secondary | ICD-10-CM

## 2013-03-13 DIAGNOSIS — A499 Bacterial infection, unspecified: Secondary | ICD-10-CM

## 2013-03-13 DIAGNOSIS — B9689 Other specified bacterial agents as the cause of diseases classified elsewhere: Secondary | ICD-10-CM

## 2013-03-13 LAB — WET PREP FOR TRICH, YEAST, CLUE
Trich, Wet Prep: NONE SEEN
Yeast Wet Prep HPF POC: NONE SEEN

## 2013-03-13 LAB — PREGNANCY, URINE: Preg Test, Ur: NEGATIVE

## 2013-03-13 MED ORDER — METRONIDAZOLE 500 MG PO TABS: 500.0000 mg | ORAL_TABLET | Freq: Two times a day (BID) | ORAL | Status: DC

## 2013-03-13 MED ORDER — METRONIDAZOLE 0.75 % VA GEL: 1.0000 | Freq: Every day | VAGINAL | Status: DC

## 2013-03-13 NOTE — Progress Notes (Signed)
  Subjective:    Patient ID: Robin Delgado, female    DOB: 1980/08/04, 32 y.o.   MRN: [id number]  HPI  Pt here with vaginal discharge and bleeding for past 4-5 days. LMP 3 weeks ago on OCP. Seen by her GYN on Tuesday, was told her bleeding was due to OCP. Had normal PAP smear in January. States discharge smells very strong, denies abd pain, cramping, or vaginal itching.  Sexually active with her current partner.  Note unable to afford doxycycline or benzoyl peroxide as she required a deductible. She does tell me that she had a tooth extracted a few weeks ago for an abscess which is now going down but she still has a knot on her chin.   Review of Systems - per above  GEN- denies fatigue, fever, weight loss,weakness, recent illness ABD- denies N/V, change in stools, abd pain GU- denies dysuria, hematuria, dribbling, incontinence, + vaginal discharge       Objective:   Physical Exam GEN- NAD, alert and oriented x3 HEENT- MMM, Left lower gumline, inflammation and mild swelling around extracted tooth, white debri noted at extraction site, Neck- Left jawline small hardened nodule, no anterior LAD ABD-NABS,soft,NT,ND GU- normal external genitalia, vaginal mucousa moist and pink, Cervix with stenosed appearing os, +bleeding from os. +Brown mal-odorous vaginal discharge. No CMT, no ovarian mass felt. EXT- No edema Pulses- Radial, DP- 2+        Assessment & Plan:

## 2013-03-13 NOTE — Patient Instructions (Addendum)
Medical Release- [facility], Last PAP Smear last 2 OV notes Take the flagyl twice a day F/U as before

## 2013-03-14 ENCOUNTER — Encounter: Payer: Self-pay | Admitting: Family Medicine

## 2013-03-14 DIAGNOSIS — N938 Other specified abnormal uterine and vaginal bleeding: Secondary | ICD-10-CM | POA: Insufficient documentation

## 2013-03-14 DIAGNOSIS — K047 Periapical abscess without sinus: Secondary | ICD-10-CM | POA: Insufficient documentation

## 2013-03-14 DIAGNOSIS — B9689 Other specified bacterial agents as the cause of diseases classified elsewhere: Secondary | ICD-10-CM | POA: Insufficient documentation

## 2013-03-14 DIAGNOSIS — N760 Acute vaginitis: Secondary | ICD-10-CM | POA: Insufficient documentation

## 2013-03-14 LAB — GC/CHLAMYDIA PROBE AMP
CT Probe RNA: NEGATIVE
GC Probe RNA: NEGATIVE

## 2013-03-14 NOTE — Assessment & Plan Note (Signed)
Has follow up with dentist, the nodule is now < 1/3 of original size, no fluctuant areas to drain

## 2013-03-14 NOTE — Assessment & Plan Note (Signed)
Flagyl sent, pt to see if she can afford gel, otherwise will take pills Discussed hygiene after sexual intercourse GC pending

## 2013-03-14 NOTE — Assessment & Plan Note (Signed)
Question related to current vaginitis or just hormonal changes, as she was evaluated by GYN recently this week for this, will obtain there records, defer further work up to them

## 2013-04-03 ENCOUNTER — Ambulatory Visit: Payer: BC Managed Care – PPO | Admitting: Family Medicine

## 2013-05-20 ENCOUNTER — Ambulatory Visit (INDEPENDENT_AMBULATORY_CARE_PROVIDER_SITE_OTHER): Payer: BC Managed Care – PPO | Admitting: Family Medicine

## 2013-05-20 VITALS — BP 102/68 | HR 68 | Temp 98.3°F | Resp 16 | Ht 63.0 in | Wt 144.0 lb

## 2013-05-20 DIAGNOSIS — F3130 Bipolar disorder, current episode depressed, mild or moderate severity, unspecified: Secondary | ICD-10-CM

## 2013-05-20 DIAGNOSIS — L700 Acne vulgaris: Secondary | ICD-10-CM

## 2013-05-20 DIAGNOSIS — Z309 Encounter for contraceptive management, unspecified: Secondary | ICD-10-CM

## 2013-05-20 DIAGNOSIS — L708 Other acne: Secondary | ICD-10-CM

## 2013-05-20 MED ORDER — ARIPIPRAZOLE 10 MG PO TABS: 10.0000 mg | ORAL_TABLET | Freq: Every day | ORAL | Status: DC

## 2013-05-20 MED ORDER — BENZOYL PEROXIDE 5 % EX GEL: Freq: Every day | CUTANEOUS | Status: DC

## 2013-05-20 MED ORDER — DOXYCYCLINE HYCLATE 100 MG PO TABS: 100.0000 mg | ORAL_TABLET | Freq: Every day | ORAL | Status: DC

## 2013-05-20 NOTE — Patient Instructions (Signed)
Start abilify once a day  Start the acne medications Call the GYN for the IUD placement F/U 4 weeks

## 2013-05-21 ENCOUNTER — Encounter: Payer: Self-pay | Admitting: Family Medicine

## 2013-05-21 NOTE — Assessment & Plan Note (Signed)
Plan to start abilify, will start low dose and titrate 10mg  daily

## 2013-05-21 NOTE — Assessment & Plan Note (Signed)
Doxycycline 100mg  daily and benzyol peroxide

## 2013-05-21 NOTE — Assessment & Plan Note (Signed)
Contraception discussed, IUD will be best option for patient, pt to schedule with GYN

## 2013-05-21 NOTE — Progress Notes (Signed)
  Subjective:    Patient ID: Robin Delgado, female    DOB: 10/08/1980, 32 y.o.   MRN: [id number]  HPI  Pt here to f/u meds. She wants to discuss Birth control options, Does not remember to take pill daily, has used patch and did not like and ring, does not want Depo. Saw GYN recently for her Exam.  Would like to have meds resent for her acne , now able to afford  Mood- diagnosis of Bipolar Disorder, in the past she refused to take medications. She stopped remeron 2 weeks ago, she has actually gained 30lbs on the medication which she thinks is great. SHe continues to have anger issues and mood swings and agrees to start medications for her bipolar.Sleep is good, appetite is good. Continues to have stress with her significant other who she finds to be the root of most of her problems. Declines returning to psychiatry    Review of Systems  GEN- denies fatigue, fever, weight loss,weakness, recent illness HEENT- denies eye drainage, change in vision, nasal discharge, CVS- denies chest pain, palpitations RESP- denies SOB, cough, wheeze ABD- denies N/V, change in stools, abd pain GU- denies dysuria, hematuria, dribbling, incontinence MSK- denies joint pain, muscle aches, injury Neuro- denies headache, dizziness, syncope, seizure activity      Objective:   Physical Exam  GEN- NAD, alert and oriented x3 Skin- multiple Nevi on face, pustular acne scatterd on forehead,chin and cheeks Psych- normal affect and mood, well groomed, good eye contact         Assessment & Plan:

## 2013-06-17 ENCOUNTER — Ambulatory Visit (INDEPENDENT_AMBULATORY_CARE_PROVIDER_SITE_OTHER): Payer: BC Managed Care – PPO | Admitting: Family Medicine

## 2013-06-17 ENCOUNTER — Encounter: Payer: Self-pay | Admitting: Family Medicine

## 2013-06-17 VITALS — BP 108/70 | HR 82 | Temp 97.7°F | Resp 18 | Ht 62.0 in | Wt 142.0 lb

## 2013-06-17 DIAGNOSIS — G4700 Insomnia, unspecified: Secondary | ICD-10-CM

## 2013-06-17 DIAGNOSIS — L708 Other acne: Secondary | ICD-10-CM

## 2013-06-17 DIAGNOSIS — F3130 Bipolar disorder, current episode depressed, mild or moderate severity, unspecified: Secondary | ICD-10-CM

## 2013-06-17 DIAGNOSIS — L700 Acne vulgaris: Secondary | ICD-10-CM

## 2013-06-17 MED ORDER — MIRTAZAPINE 15 MG PO TABS: 15.0000 mg | ORAL_TABLET | Freq: Every day | ORAL | Status: DC

## 2013-06-17 NOTE — Patient Instructions (Signed)
Restart the remeron  Continue gel for acne Try Cetaphil cleanser and moisturizer F/U 3 months

## 2013-06-17 NOTE — Progress Notes (Signed)
   Subjective:    Patient ID: Robin Delgado, female    DOB: January 25, 1981, 32 y.o.   MRN: [id number]  HPI Patient here to followup medications. She did start doxycycline however had upset stomach and nausea with the medication therefore does not want to continue with this. She is using the benzo peroxide for the acne and states it is drying of the lesions. She continues to use [employer].  Depression/bipolar she was on Porter she had stopped this on her last visit it was noted that she gained 30 pounds in the medication. I tried to switch her to Abilify however she does not want to take the medication. She states that the main problem is her significant other and until he leaves her mood while was be bad. She is upset because she is not sleeping well off of the Remeron and her appetite has decreased again. She has lost 3 pounds since her last visit. She is sure that she will lose significant amounts of weight without the medication and wants to restart it as this was the only thing that helped keep her appetite as well as help her sleep and her mood. She understands that she was diagnosed with bipolar but she still does not believe the diagnosis and does not want to followup with psychiatry.   Review of Systems  GEN- denies fatigue, fever, weight loss,weakness, recent illness HEENT- denies eye drainage, change in vision, nasal discharge, CVS- denies chest pain, palpitations RESP- denies SOB, cough, wheeze Neuro- denies headache, dizziness, syncope, seizure activity      Objective:   Physical Exam  GEN-NAD,alert and oriented x 3 Psych- Sitting in chair with arms folded, rolls her eyes a few times during discussion, not depressed or anxious, more angry mood. No SI, no hallucinations.  Skin- open comedones on forehead and chin, hyperpigmented scaring, nevi, small cold sore upper left lip      Assessment & Plan:

## 2013-06-17 NOTE — Assessment & Plan Note (Signed)
For now we'll continue the benzoyl peroxide do not see any pustules today. She declines using oral antibiotic

## 2013-06-17 NOTE — Assessment & Plan Note (Signed)
Per above restart medications

## 2013-06-17 NOTE — Assessment & Plan Note (Signed)
She refuses to take any other medications. She was at least less depressed on the Remeron and was sleeping better therefore restart her at 15 mg. I discussed with her she continues to gain significant amount of weight I will not continue the medication do to concern for hypertension hyperlipidemia elevated blood sugar with the weight gain He voiced understanding. She refuses to go back to psychiatry at this time

## 2013-07-14 ENCOUNTER — Ambulatory Visit: Payer: BC Managed Care – PPO | Admitting: Family Medicine

## 2013-07-27 ENCOUNTER — Ambulatory Visit (INDEPENDENT_AMBULATORY_CARE_PROVIDER_SITE_OTHER): Payer: BC Managed Care – PPO | Admitting: Physician Assistant

## 2013-07-27 ENCOUNTER — Ambulatory Visit: Payer: BC Managed Care – PPO | Admitting: Family Medicine

## 2013-07-27 ENCOUNTER — Encounter: Payer: Self-pay | Admitting: Physician Assistant

## 2013-07-27 VITALS — BP 132/88 | HR 68 | Temp 98.2°F | Resp 18 | Wt 140.0 lb

## 2013-07-27 DIAGNOSIS — R5381 Other malaise: Secondary | ICD-10-CM

## 2013-07-27 DIAGNOSIS — F429 Obsessive-compulsive disorder, unspecified: Secondary | ICD-10-CM

## 2013-07-27 DIAGNOSIS — R8290 Unspecified abnormal findings in urine: Secondary | ICD-10-CM

## 2013-07-27 DIAGNOSIS — F3130 Bipolar disorder, current episode depressed, mild or moderate severity, unspecified: Secondary | ICD-10-CM

## 2013-07-27 DIAGNOSIS — F411 Generalized anxiety disorder: Secondary | ICD-10-CM

## 2013-07-27 DIAGNOSIS — D509 Iron deficiency anemia, unspecified: Secondary | ICD-10-CM

## 2013-07-27 DIAGNOSIS — G4700 Insomnia, unspecified: Secondary | ICD-10-CM

## 2013-07-27 DIAGNOSIS — N390 Urinary tract infection, site not specified: Secondary | ICD-10-CM

## 2013-07-27 DIAGNOSIS — R5383 Other fatigue: Secondary | ICD-10-CM

## 2013-07-27 DIAGNOSIS — R82998 Other abnormal findings in urine: Secondary | ICD-10-CM

## 2013-07-27 LAB — CBC WITH DIFFERENTIAL/PLATELET
Basophils Absolute: 0 10*3/uL (ref 0.0–0.1)
Basophils Relative: 0 % (ref 0–1)
Eosinophils Absolute: 0.2 10*3/uL (ref 0.0–0.7)
Eosinophils Relative: 2 % (ref 0–5)
HCT: 35.7 % — ABNORMAL LOW (ref 36.0–46.0)
Hemoglobin: 11.6 g/dL — ABNORMAL LOW (ref 12.0–15.0)
Lymphocytes Relative: 34 % (ref 12–46)
Lymphs Abs: 2.9 10*3/uL (ref 0.7–4.0)
MCH: 22.7 pg — ABNORMAL LOW (ref 26.0–34.0)
MCHC: 32.5 g/dL (ref 30.0–36.0)
MCV: 69.7 fL — ABNORMAL LOW (ref 78.0–100.0)
Monocytes Absolute: 0.6 10*3/uL (ref 0.1–1.0)
Monocytes Relative: 6 % (ref 3–12)
Neutro Abs: 5.1 10*3/uL (ref 1.7–7.7)
Neutrophils Relative %: 58 % (ref 43–77)
Platelets: 323 10*3/uL (ref 150–400)
RBC: 5.12 MIL/uL — ABNORMAL HIGH (ref 3.87–5.11)
RDW: 15.8 % — ABNORMAL HIGH (ref 11.5–15.5)
WBC: 8.7 10*3/uL (ref 4.0–10.5)

## 2013-07-27 LAB — URINALYSIS, ROUTINE W REFLEX MICROSCOPIC
Bilirubin Urine: NEGATIVE
Glucose, UA: NEGATIVE mg/dL
Hgb urine dipstick: NEGATIVE
Nitrite: NEGATIVE
Protein, ur: 30 mg/dL — AB
Specific Gravity, Urine: 1.015 (ref 1.005–1.030)
Urobilinogen, UA: 2 mg/dL — ABNORMAL HIGH (ref 0.0–1.0)
pH: 8.5 — ABNORMAL HIGH (ref 5.0–8.0)

## 2013-07-27 LAB — URINALYSIS, MICROSCOPIC ONLY
Casts: NONE SEEN
Crystals: NONE SEEN

## 2013-07-27 LAB — PREGNANCY, URINE: Preg Test, Ur: NEGATIVE

## 2013-07-27 MED ORDER — CIPROFLOXACIN HCL 500 MG PO TABS: 500.0000 mg | ORAL_TABLET | Freq: Two times a day (BID) | ORAL | Status: DC

## 2013-07-27 NOTE — Progress Notes (Signed)
Patient ID: Robin PigeonCynthia N Deboard MRN: [id number], DOB: 01/28/1981, 33 y.o. Date of Encounter: @DATE @  Chief Complaint:  Chief Complaint  Patient presents with  . c/o feeling very tired    ? anemia,  also strong odor to urine    HPI: 33 y.o. year old AA female  presents with complaints as above.  Says that she's been feeling more fatigued than usual for the past 2 weeks. Says that she's been feeling very tired and feeling like she needs increased sleep. Says even in the middle of the day she can just fall asleep. 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 Says that even at work she feels like she just fall asleep.  Says she is not having difficulty with insomnia. Goes to bed around 10 PM and wakes up around 5:30 AM. Says this is the same schedule as she's been on for a long time. Says that she feels like she is sleeping pretty good when she is sleeping.  I discussed that we can do some lab work to evaluate for other medical causes of fatigue but asked if there was any possibility that she was feeling depressed as this sometimes can make people feel tired. She then says that she does know she has a problem with some depression and that she is seeing Dr. Jeanice Limurham about this. She says certainly this could be playing part of the role but she really feels that she is completely drained as if there is something else contributing to her fatigue.    Past Medical History  Diagnosis Date  . Depression   . STD (sexually transmitted disease)     Trichomonas,   . CIN II (cervical intraepithelial neoplasia II) 2008    Colpo/Leep      Home Meds: See attached medication section for current medication list. Any medications entered into computer today will not appear on this note's list. The medications listed below were entered prior to today. Current Outpatient Prescriptions on File Prior to Visit  Medication Sig  Dispense Refill  . benzoyl peroxide 5 % gel Apply topically at bedtime.  60 g  3  . clotrimazole-betamethasone (LOTRISONE) cream Apply to affected area 2 times daily  15 g  1  . norgestimate-ethinyl estradiol (ORTHO-CYCLEN,SPRINTEC,PREVIFEM) 0.25-35 MG-MCG tablet Take 1 tablet by mouth daily.       No current facility-administered medications on file prior to visit.    Allergies:  Allergies  Allergen Reactions  . Macrobid [Nitrofurantoin Macrocrystal]     History   Social History  . Marital Status: Single    Spouse Name: N/A    Number of Children: N/A  . Years of Education: N/A   Occupational History  . Not on file.   Social History Main Topics  . Smoking status: Never Smoker   . Smokeless tobacco: Not on file  . Alcohol Use: No  . Drug Use: No  . Sexual Activity: Yes    Birth Control/ Protection: Patch   Other Topics Concern  . Not on file   Social History Narrative  . No narrative on file    Family History  Problem Relation Age of Onset  . Hypertension Mother   . Cancer Father     stomach   . Hypertension Brother   . Alcohol abuse Brother   . ADD / ADHD Son   . Anxiety disorder Brother   .  Depression Brother   . Drug abuse Neg Hx   . Bipolar disorder Neg Hx   . Dementia Neg Hx   . OCD Neg Hx   . Paranoid behavior Neg Hx   . Schizophrenia Neg Hx   . Seizures Neg Hx   . Sexual abuse Neg Hx   . Physical abuse Neg Hx   . Diabetes Maternal Grandmother      Review of Systems:  See HPI for pertinent ROS. All other ROS negative.    Physical Exam: Blood pressure 132/88, pulse 68, temperature 98.2 F (36.8 C), temperature source Oral, resp. rate 18, weight 140 lb (63.504 kg), last menstrual period 07/14/2013., Body mass index is 25.6 kg/(m^2). General: WNWD AAF. Appears in no acute distress. Lungs: Clear bilaterally to auscultation without wheezes, rales, or rhonchi. Breathing is unlabored. Heart: RRR with S1 S2. No murmurs, rubs, or gallops. Abdomen:  Soft, non-tender, non-distended with normoactive bowel sounds. No hepatomegaly. No rebound/guarding. No obvious abdominal masses. Musculoskeletal:  Strength and tone normal for age. Extremities/Skin: Warm and dry. No clubbing or cyanosis. No edema. No rashes or suspicious lesions. Neuro: Alert and oriented X 3. Moves all extremities spontaneously. Gait is normal. CNII-XII grossly in tact. Psych:  Responds to questions appropriately with a normal affect.   Results for orders placed in visit on 07/27/13  URINALYSIS, ROUTINE W REFLEX MICROSCOPIC      Result Value Range   Color, Urine YELLOW  YELLOW   APPearance CLEAR  CLEAR   Specific Gravity, Urine 1.015  1.005 - 1.030   pH 8.5 (*) 5.0 - 8.0   Glucose, UA NEG  NEG mg/dL   Bilirubin Urine NEG  NEG   Ketones, ur TRACE (*) NEG mg/dL   Hgb urine dipstick NEG  NEG   Protein, ur 30 (*) NEG mg/dL   Urobilinogen, UA 2 (*) 0.0 - 1.0 mg/dL   Nitrite NEG  NEG   Leukocytes, UA TRACE (*) NEG  PREGNANCY, URINE      Result Value Range   Preg Test, Ur NEG    URINALYSIS, MICROSCOPIC ONLY      Result Value Range   Squamous Epithelial / LPF FEW  RARE   Crystals NONE SEEN  NONE SEEN   Casts NONE SEEN  NONE SEEN   WBC, UA 7-10 (*) <3 WBC/hpf   RBC / HPF 0-2  <3 RBC/hpf   Bacteria, UA FEW (*) RARE     ASSESSMENT AND PLAN:  33 y.o. year old female with  1. Fatigue - Pregnancy, urine - CBC with Differential - COMPLETE METABOLIC PANEL WITH GFR - TSH  2. Foul smelling urine - Urinalysis, Routine w reflex microscopic  3. UTI (urinary tract infection) History of intolerance/allergy to Macrodantin - ciprofloxacin (CIPRO) 500 MG tablet; Take 1 tablet (500 mg total) by mouth 2 (two) times daily.  Dispense: 6 tablet; Refill: 0  4. Bipolar I disorder, most recent episode (or current) depressed, unspecified She has been seeing Dr. Jeanice Lim for this and being managed by Dr. Jeanice Lim.  5. Generalized anxiety disorder Per Dr. Jeanice Lim   7. OCD (obsessive  compulsive disorder) Per Dr. Jeanice Lim  I discussed with her that we will check labs to evaluate for other medical causes of fatigue and make sure there is nothing else going on. I discussed that if these labs are all normal then she needs to followup with Dr. Jeanice Lim regarding her Psych management. ( I reviewed Dr. Rhona Raider LOV note 06/17/13--NOTE that at that visit, Dr  Cecilia documented that pt refused to f/u with psychiatry and that she refused to try any other meds except Remeron)   Signed, [address] [place], [place], [facility] 07/27/2013 4:08 PM

## 2013-07-28 LAB — COMPLETE METABOLIC PANEL WITH GFR
ALT: 27 U/L (ref 0–35)
AST: 23 U/L (ref 0–37)
Albumin: 3.9 g/dL (ref 3.5–5.2)
Alkaline Phosphatase: 92 U/L (ref 39–117)
BUN: 14 mg/dL (ref 6–23)
CO2: 24 mEq/L (ref 19–32)
Calcium: 9.3 mg/dL (ref 8.4–10.5)
Chloride: 103 mEq/L (ref 96–112)
Creat: 0.83 mg/dL (ref 0.50–1.10)
GFR, Est African American: >89 mL/min
GFR, Est Non African American: >89 mL/min
Glucose, Bld: 88 mg/dL (ref 70–99)
Potassium: 4.6 mEq/L (ref 3.5–5.3)
Sodium: 138 mEq/L (ref 135–145)
Total Bilirubin: 0.2 mg/dL — ABNORMAL LOW (ref 0.3–1.2)
Total Protein: 7.4 g/dL (ref 6.0–8.3)

## 2013-07-28 LAB — TSH: TSH: 1.555 u[IU]/mL (ref 0.350–4.500)

## 2013-07-29 ENCOUNTER — Emergency Department (HOSPITAL_COMMUNITY)
Admission: EM | Admit: 2013-07-29 | Discharge: 2013-07-30 | Disposition: A | Discharge status: Home / Self Care | Payer: BC Managed Care – PPO | Attending: Emergency Medicine | Admitting: Emergency Medicine

## 2013-07-29 ENCOUNTER — Encounter (HOSPITAL_COMMUNITY): Payer: Self-pay | Admitting: Emergency Medicine

## 2013-07-29 DIAGNOSIS — Z792 Long term (current) use of antibiotics: Secondary | ICD-10-CM | POA: Insufficient documentation

## 2013-07-29 DIAGNOSIS — F3289 Other specified depressive episodes: Secondary | ICD-10-CM | POA: Insufficient documentation

## 2013-07-29 DIAGNOSIS — Z3202 Encounter for pregnancy test, result negative: Secondary | ICD-10-CM | POA: Insufficient documentation

## 2013-07-29 DIAGNOSIS — Z8619 Personal history of other infectious and parasitic diseases: Secondary | ICD-10-CM | POA: Insufficient documentation

## 2013-07-29 DIAGNOSIS — R110 Nausea: Secondary | ICD-10-CM

## 2013-07-29 DIAGNOSIS — F329 Major depressive disorder, single episode, unspecified: Secondary | ICD-10-CM | POA: Insufficient documentation

## 2013-07-29 DIAGNOSIS — Z9889 Other specified postprocedural states: Secondary | ICD-10-CM | POA: Insufficient documentation

## 2013-07-29 DIAGNOSIS — N390 Urinary tract infection, site not specified: Secondary | ICD-10-CM | POA: Insufficient documentation

## 2013-07-29 DIAGNOSIS — Z8742 Personal history of other diseases of the female genital tract: Secondary | ICD-10-CM | POA: Insufficient documentation

## 2013-07-29 DIAGNOSIS — Z79899 Other long term (current) drug therapy: Secondary | ICD-10-CM | POA: Insufficient documentation

## 2013-07-29 DIAGNOSIS — IMO0002 Reserved for concepts with insufficient information to code with codable children: Secondary | ICD-10-CM | POA: Insufficient documentation

## 2013-07-29 MED ORDER — ONDANSETRON HCL 4 MG/2ML IJ SOLN
4.0000 mg | Freq: Once | INTRAMUSCULAR | Status: AC
  Administered 2013-07-30: 4 mg via INTRAVENOUS
  Filled 2013-07-29: qty 2

## 2013-07-29 MED ORDER — SODIUM CHLORIDE 0.9 % IV SOLN
1000.0000 mL | INTRAVENOUS | Status: DC
  Administered 2013-07-30: 1000 mL via INTRAVENOUS

## 2013-07-29 MED ORDER — SODIUM CHLORIDE 0.9 % IV SOLN
1000.0000 mL | Freq: Once | INTRAVENOUS | Status: AC
  Administered 2013-07-30: 1000 mL via INTRAVENOUS

## 2013-07-29 NOTE — ED Notes (Signed)
Patient c/o abdominal pain that started yesterday with nausea, patient states she has had little appetite.

## 2013-07-29 NOTE — ED Provider Notes (Signed)
CSN: [id number]     Arrival date & time 07/29/13  2017 History  This chart was scribed for Lyanne CoKevin M Tymira Horkey, MD by Ardelia Memsylan Malpass, ED Scribe. This patient was seen in room APA04/APA04 and the patient's care was started at 11:31 PM.   Chief Complaint  Patient presents with  . Abdominal Pain    The history is provided by the patient. No language interpreter was used.    HPI Comments: Robin Delgado is a 33 y.o. female who presents to the Emergency Department complaining of intermittent, moderate lower abdominal pain onset yesterday. She reports associated nausea but denies emesis. She staets that she was seen by her PCP 2 days ago, and was told she might have a UTI. She states that she was prescribed Cipro but hasn't taken it due to nausea, and not wanting to further upset her stomach. She denies any vaginal complaints, back pain, diarrhea or any other symptoms.   Past Medical History  Diagnosis Date  . Depression   . STD (sexually transmitted disease)     Trichomonas,   . CIN II (cervical intraepithelial neoplasia II) 2008    Colpo/Leep    Past Surgical History  Procedure Laterality Date  . Dilation and curettage of uterus N/A 07/09/1998    and 02/07/2003   Family History  Problem Relation Age of Onset  . Hypertension Mother   . Cancer Father     stomach   . Hypertension Brother   . Alcohol abuse Brother   . ADD / ADHD Son   . Anxiety disorder Brother   . Depression Brother   . Drug abuse Neg Hx   . Bipolar disorder Neg Hx   . Dementia Neg Hx   . OCD Neg Hx   . Paranoid behavior Neg Hx   . Schizophrenia Neg Hx   . Seizures Neg Hx   . Sexual abuse Neg Hx   . Physical abuse Neg Hx   . Diabetes Maternal Grandmother    History  Substance Use Topics  . Smoking status: Never Smoker   . Smokeless tobacco: Not on file  . Alcohol Use: No   OB History   Grav Para Term Preterm Abortions TAB SAB Ect Mult Living                 Review of Systems A complete 10 system  review of systems was obtained and all systems are negative except as noted in the HPI and PMH.   Allergies  Macrobid  Home Medications   Current Outpatient Rx  Name  Route  Sig  Dispense  Refill  . benzoyl peroxide 5 % gel   Topical   Apply topically at bedtime.   60 g   3   . cephALEXin (KEFLEX) 500 MG capsule   Oral   Take 1 capsule (500 mg total) by mouth 4 (four) times daily.   28 capsule   0   . ciprofloxacin (CIPRO) 500 MG tablet   Oral   Take 1 tablet (500 mg total) by mouth 2 (two) times daily.   6 tablet   0   . clotrimazole-betamethasone (LOTRISONE) cream      Apply to affected area 2 times daily   15 g   1   . mirtazapine (REMERON) 15 MG tablet   Oral   Take 15 mg by mouth at bedtime as needed.         . norgestimate-ethinyl estradiol (ORTHO-CYCLEN,SPRINTEC,PREVIFEM) 0.25-35 MG-MCG tablet   Oral  Take 1 tablet by mouth daily.         . ondansetron (ZOFRAN ODT) 8 MG disintegrating tablet   Oral   Take 1 tablet (8 mg total) by mouth every 8 (eight) hours as needed for nausea or vomiting.   10 tablet   0    Triage Vitals: BP 114/76  Pulse 83  Temp(Src) 98 F (36.7 C) (Oral)  Resp 20  Ht 5' (1.524 m)  Wt 137 lb (62.143 kg)  BMI 26.76 kg/m2  SpO2 100%  LMP 07/14/2013  Physical Exam  Nursing note and vitals reviewed. Constitutional: She is oriented to person, place, and time. She appears well-developed and well-nourished. No distress.  HENT:  Head: Normocephalic and atraumatic.  Eyes: EOM are normal.  Neck: Normal range of motion.  Cardiovascular: Normal rate, regular rhythm and normal heart sounds.   Pulmonary/Chest: Effort normal and breath sounds normal.  Abdominal: Soft. She exhibits no distension. There is tenderness (Mild suprapubic and mild RLQ tenderness). There is no rebound and no guarding.  Musculoskeletal: Normal range of motion.  Neurological: She is alert and oriented to person, place, and time.  Skin: Skin is warm and  dry.  Psychiatric: She has a normal mood and affect. Judgment normal.    ED Course  Procedures (including critical care time)  DIAGNOSTIC STUDIES: Oxygen Saturation is 100% on RA, normal by my interpretation.    COORDINATION OF CARE: 11:37 PM- Discussed plan to obtain diagnostic lab work. Pt offered pain mediation and pt declines. Pt advised of plan for treatment and pt agrees.  Labs Review Labs Reviewed  URINALYSIS, ROUTINE W REFLEX MICROSCOPIC - Abnormal; Notable for the following:    APPearance HAZY (*)    Hgb urine dipstick TRACE (*)    Ketones, ur >80 (*)    Nitrite POSITIVE (*)    All other components within normal limits  CBC WITH DIFFERENTIAL - Abnormal; Notable for the following:    WBC 12.3 (*)    Hemoglobin 11.9 (*)    MCV 72.9 (*)    MCH 23.3 (*)    Neutro Abs 9.2 (*)    All other components within normal limits  BASIC METABOLIC PANEL - Abnormal; Notable for the following:    Sodium 136 (*)    Glucose, Bld 108 (*)    All other components within normal limits  URINE MICROSCOPIC-ADD ON - Abnormal; Notable for the following:    Squamous Epithelial / LPF MANY (*)    Bacteria, UA MANY (*)    All other components within normal limits  PREGNANCY, URINE   Imaging Review Ct Abdomen Pelvis W Contrast  07/30/2013   CLINICAL DATA:  Right lower quadrant abdominal pain and nausea as.  EXAM: CT ABDOMEN AND PELVIS WITH CONTRAST  TECHNIQUE: Multidetector CT imaging of the abdomen and pelvis was performed using the standard protocol following bolus administration of intravenous contrast.  CONTRAST:  50mL OMNIPAQUE IOHEXOL 300 MG/ML SOLN, OMNIPAQUE IOHEXOL 300 MG/ML SOLN  COMPARISON:  Prior ultrasound of pregnancy performed 07/26/2005  FINDINGS: The visualized lung bases are clear.  The liver and spleen are unremarkable in appearance. The gallbladder is within normal limits. The pancreas and adrenal glands are unremarkable.  The kidneys are unremarkable in appearance. There is  no evidence of hydronephrosis. No renal or ureteral stones are seen. No perinephric stranding is appreciated.  No free fluid is identified. The small bowel is unremarkable in appearance. The stomach is within normal limits. No acute vascular abnormalities  are seen.  The appendix is normal in caliber and contains contrast, without evidence for appendicitis. Contrast progresses to the level of the sigmoid colon. The colon is unremarkable in appearance.  The bladder is mildly distended and grossly unremarkable in appearance. There is somewhat unusual haziness about the uterus; the uterus is otherwise grossly unremarkable. The ovaries are relatively symmetric; no suspicious adnexal masses are seen. No inguinal lymphadenopathy is seen.  No acute osseous abnormalities are identified.  IMPRESSION: 1. No acute abnormality seen within the abdomen or pelvis to explain the patient's symptoms. 2. Mild nonspecific haziness about the uterus; this may remain within normal limits. Would correlate for any focal pelvic symptoms.   Electronically Signed   By: Roanna Raider M.D.   On: 07/30/2013 03:47  I personally reviewed the imaging tests through PACS system I reviewed available ER/hospitalization records through the EMR    EKG Interpretation   None       MDM   1. Nausea   2. Urinary tract infection    3:54 AM At this time.  CT scan without acute abnormality.  Nitrite positive urine.  Patient to cover for urinary tract infection.  Urine culture sent.  Home with nausea medicine PCP followup.  She understands return to the ER for new or worsening symptoms   I personally performed the services described in this documentation, which was scribed in my presence. The recorded information has been reviewed and is accurate.      Lyanne Co, MD 07/30/13 [phone]

## 2013-07-30 ENCOUNTER — Emergency Department (HOSPITAL_COMMUNITY): Payer: BC Managed Care – PPO

## 2013-07-30 ENCOUNTER — Telehealth: Payer: Self-pay | Admitting: Family Medicine

## 2013-07-30 DIAGNOSIS — D509 Iron deficiency anemia, unspecified: Secondary | ICD-10-CM

## 2013-07-30 LAB — BASIC METABOLIC PANEL
BUN: 10 mg/dL (ref 6–23)
CO2: 22 mEq/L (ref 19–32)
Calcium: 9.5 mg/dL (ref 8.4–10.5)
Chloride: 100 mEq/L (ref 96–112)
Creatinine, Ser: 0.68 mg/dL (ref 0.50–1.10)
GFR calc Af Amer: >90 mL/min (ref >90)
GFR calc non Af Amer: >90 mL/min (ref >90)
Glucose, Bld: 108 mg/dL — ABNORMAL HIGH (ref 70–99)
Potassium: 3.9 mEq/L (ref 3.7–5.3)
Sodium: 136 mEq/L — ABNORMAL LOW (ref 137–147)

## 2013-07-30 LAB — URINALYSIS, ROUTINE W REFLEX MICROSCOPIC
Bilirubin Urine: NEGATIVE
Glucose, UA: NEGATIVE mg/dL
Ketones, ur: >80 mg/dL — AB
Leukocytes, UA: NEGATIVE
Nitrite: POSITIVE — AB
Protein, ur: NEGATIVE mg/dL
Specific Gravity, Urine: 1.02 (ref 1.005–1.030)
Urobilinogen, UA: 0.2 mg/dL (ref 0.0–1.0)
pH: 7.5 (ref 5.0–8.0)

## 2013-07-30 LAB — URINE MICROSCOPIC-ADD ON

## 2013-07-30 LAB — CBC WITH DIFFERENTIAL/PLATELET
Basophils Absolute: 0 10*3/uL (ref 0.0–0.1)
Basophils Relative: 0 % (ref 0–1)
Eosinophils Absolute: 0 10*3/uL (ref 0.0–0.7)
Eosinophils Relative: 0 % (ref 0–5)
HCT: 37.2 % (ref 36.0–46.0)
Hemoglobin: 11.9 g/dL — ABNORMAL LOW (ref 12.0–15.0)
Lymphocytes Relative: 21 % (ref 12–46)
Lymphs Abs: 2.6 10*3/uL (ref 0.7–4.0)
MCH: 23.3 pg — ABNORMAL LOW (ref 26.0–34.0)
MCHC: 32 g/dL (ref 30.0–36.0)
MCV: 72.9 fL — ABNORMAL LOW (ref 78.0–100.0)
Monocytes Absolute: 0.5 10*3/uL (ref 0.1–1.0)
Monocytes Relative: 4 % (ref 3–12)
Neutro Abs: 9.2 10*3/uL — ABNORMAL HIGH (ref 1.7–7.7)
Neutrophils Relative %: 75 % (ref 43–77)
Platelets: 300 10*3/uL (ref 150–400)
RBC: 5.1 MIL/uL (ref 3.87–5.11)
RDW: 14.9 % (ref 11.5–15.5)
WBC: 12.3 10*3/uL — ABNORMAL HIGH (ref 4.0–10.5)

## 2013-07-30 LAB — PREGNANCY, URINE: Preg Test, Ur: NEGATIVE

## 2013-07-30 MED ORDER — CEPHALEXIN 500 MG PO CAPS: 500.0000 mg | ORAL_CAPSULE | Freq: Four times a day (QID) | ORAL | Status: DC

## 2013-07-30 MED ORDER — IOHEXOL 300 MG/ML  SOLN
100.0000 mL | Freq: Once | INTRAMUSCULAR | Status: AC | PRN
  Administered 2013-07-30: 100 mL via INTRAVENOUS

## 2013-07-30 MED ORDER — IOHEXOL 300 MG/ML  SOLN
50.0000 mL | Freq: Once | INTRAMUSCULAR | Status: AC | PRN
  Administered 2013-07-30: 50 mL via ORAL

## 2013-07-30 MED ORDER — POLYSACCHARIDE IRON COMPLEX 150 MG PO CAPS: 150.0000 mg | ORAL_CAPSULE | Freq: Two times a day (BID) | ORAL | Status: DC

## 2013-07-30 MED ORDER — DEXTROSE 5 % IV SOLN
1.0000 g | Freq: Once | INTRAVENOUS | Status: AC
  Administered 2013-07-30: 1 g via INTRAVENOUS
  Filled 2013-07-30: qty 10

## 2013-07-30 MED ORDER — ONDANSETRON 8 MG PO TBDP: 8.0000 mg | ORAL_TABLET | Freq: Three times a day (TID) | ORAL | Status: DC | PRN

## 2013-07-30 NOTE — ED Notes (Signed)
Completed first bottle, ambulatory to bathroom and now encouraged to start on second bottle of contrast

## 2013-07-30 NOTE — Telephone Encounter (Signed)
Message copied by Donne AnonPLUMMER, KIM M on Thu Jul 30, 2013  2:55 PM ------      Message from: Allayne ButcherIXON, MARY      Created: Tue Jul 28, 2013  9:10 AM       Urinalysis was addressed at the office visit. She was treated for UTI at that visit.      She was also having complaints of fatigue. I saw in epic that she had at prior history of iron deficiency anemia. Tell her the labs are still consistent with this but the numbers are stable over the past year.      Tell her that iron deficiency anemia can cause fatigue and other problems and that she definitely needs to take iron on a regular basis. Send Rx in for Nu-Iron 150 milligram 1 by mouth twice a day #60 with 11 refills      Tell her the rest of the labs are normal.      At her visit we discussed that she had been seeing Dr. Jeanice Limurham for psychiatric issues and the fact that these problems may be causing her fatigue. Recommend followup with Dr. Jeanice Limurham for further management of his issues. Labs show no other medical problems to be cause of her symptoms.Marland Kitchen. ------

## 2013-07-30 NOTE — Telephone Encounter (Signed)
Called patient with lab results.  Pt states still very sick.  Has been unable to keep anything on stomach.  Seen in ED last night.  Did CT scan gave IVF.  Home now and still sick.  Zofran not helping.  Can't keep abx down.  Told return to ED but does not want to be there all night again??

## 2013-07-30 NOTE — Telephone Encounter (Signed)
RX to pharmacy.  Left pt mess to call back for results

## 2013-07-30 NOTE — ED Notes (Signed)
Drinking contrast.

## 2013-07-31 NOTE — Telephone Encounter (Signed)
Pt seen in Office, given new antibiotic in ER, okay to use zofran sublingual then take antibiotic BRAT diet

## 2013-08-28 ENCOUNTER — Ambulatory Visit (INDEPENDENT_AMBULATORY_CARE_PROVIDER_SITE_OTHER): Payer: BC Managed Care – PPO | Admitting: Family Medicine

## 2013-08-28 ENCOUNTER — Encounter: Payer: Self-pay | Admitting: Family Medicine

## 2013-08-28 VITALS — BP 110/80 | HR 78 | Temp 98.3°F | Resp 20 | Ht 62.0 in | Wt 133.0 lb

## 2013-08-28 DIAGNOSIS — N390 Urinary tract infection, site not specified: Secondary | ICD-10-CM

## 2013-08-28 DIAGNOSIS — R112 Nausea with vomiting, unspecified: Secondary | ICD-10-CM

## 2013-08-28 LAB — CBC WITH DIFFERENTIAL/PLATELET
Basophils Absolute: 0 10*3/uL (ref 0.0–0.1)
Basophils Relative: 0 % (ref 0–1)
Eosinophils Absolute: 0.1 10*3/uL (ref 0.0–0.7)
Eosinophils Relative: 1 % (ref 0–5)
HCT: 37.6 % (ref 36.0–46.0)
Hemoglobin: 12.1 g/dL (ref 12.0–15.0)
Lymphocytes Relative: 25 % (ref 12–46)
Lymphs Abs: 2.2 10*3/uL (ref 0.7–4.0)
MCH: 23 pg — ABNORMAL LOW (ref 26.0–34.0)
MCHC: 32.2 g/dL (ref 30.0–36.0)
MCV: 71.6 fL — ABNORMAL LOW (ref 78.0–100.0)
Monocytes Absolute: 0.3 10*3/uL (ref 0.1–1.0)
Monocytes Relative: 3 % (ref 3–12)
Neutro Abs: 6.1 10*3/uL (ref 1.7–7.7)
Neutrophils Relative %: 71 % (ref 43–77)
Platelets: 325 10*3/uL (ref 150–400)
RBC: 5.25 MIL/uL — ABNORMAL HIGH (ref 3.87–5.11)
RDW: 16.3 % — ABNORMAL HIGH (ref 11.5–15.5)
WBC: 8.6 10*3/uL (ref 4.0–10.5)

## 2013-08-28 LAB — COMPREHENSIVE METABOLIC PANEL
ALT: 26 U/L (ref 0–35)
AST: 19 U/L (ref 0–37)
Albumin: 4.3 g/dL (ref 3.5–5.2)
Alkaline Phosphatase: 84 U/L (ref 39–117)
BUN: 8 mg/dL (ref 6–23)
CO2: 24 mEq/L (ref 19–32)
Calcium: 9.2 mg/dL (ref 8.4–10.5)
Chloride: 102 mEq/L (ref 96–112)
Creat: 0.75 mg/dL (ref 0.50–1.10)
Glucose, Bld: 85 mg/dL (ref 70–99)
Potassium: 3.7 mEq/L (ref 3.5–5.3)
Sodium: 138 mEq/L (ref 135–145)
Total Bilirubin: 0.6 mg/dL (ref 0.2–1.2)
Total Protein: 7.9 g/dL (ref 6.0–8.3)

## 2013-08-28 LAB — LIPASE: Lipase: 13 U/L (ref 0–75)

## 2013-08-28 MED ORDER — CLOTRIMAZOLE-BETAMETHASONE 1-0.05 % EX CREA: 1.0000 "application " | TOPICAL_CREAM | Freq: Two times a day (BID) | CUTANEOUS | Status: DC

## 2013-08-28 NOTE — Patient Instructions (Signed)
Take the Keflex antibiotics four times a day  Take the zofran three times a day as needed Increase fluids Take the nexium once a day  I will call with results of labs  If not better next step will be ultrasound of gallbladder and referral to specialist

## 2013-08-28 NOTE — Progress Notes (Signed)
Patient ID: Robin PigeonCynthia N Tierno, female   DOB: 08/14/1980, 33 y.o.   MRN: [id number]   Subjective:    Patient ID: Robin Pigeonynthia N Mittelman, female    DOB: 04/07/1981, 33 y.o.   MRN: [id number]  Patient presents for still feels sick on stomach Pt here with 3 weeks of N/V, she was prescribed Cipro in the office on 1/19, she had N/V then went to ER and they changed to Keflex QID ( note she is till taking but only twice a day) CT scan and labs were unremarkable. Her nausea persisted so [facility] repeat UA showed some leukocytes so she was given script for Bactrim which she has not taken. She has been using zofran some but states she is afraid to take any the medications because she thinks they will make her vomit. She has not been taking her remeron, her weight is down 6 lbs over past month. She was concerned about her gallbladder as she gets nausea and vomiting spells with eating, then she stated you thinks its all in my head.  Prior to emesis she has a burning sensation in the center of her abdomen Emesis NB/NB    Review Of Systems:  GEN- denies fatigue, fever, weight loss,weakness, recent illness HEENT- denies eye drainage, change in vision, nasal discharge, CVS- denies chest pain, palpitations RESP- denies SOB, cough, wheeze ABD- + N/V, change in stools,+ abd pain GU- denies dysuria, hematuria, dribbling, incontinence        Objective:    BP 110/80  Pulse 78  Temp(Src) 98.3 F (36.8 C) (Oral)  Resp 20  Ht 5\' 2"  (1.575 m)  Wt 133 lb (60.328 kg)  BMI 24.32 kg/m2 GEN- NAD, alert and oriented x3 HEENT- PERRL, EOMI, non injected sclera, pink conjunctiva, MMM, oropharynx clear CVS- RRR, no murmur RESP-CTAB ABD-NABS,soft,NT,ND, noHSM EXT- No edema Pulses- Radial, DP- 2+        Assessment & Plan:      Problem List Items Addressed This Visit   None    Visit Diagnoses   Nausea and vomiting    -  Primary    DD- N/V due to untreated UTI, gastritis vs  gallbladder etiology or gastric dysfunction, vs psychosomatic. CT scan is negative, exam benign,   Will have her complete antibiotics, labs to be done  I have also given Nexium to take If no improvement with treatment of above, consider RUQ ultrasound vs referral to GI,     Relevant Orders       Lipase (Completed)       Comprehensive metabolic panel (Completed)       CBC with Differential (Completed)    UTI (urinary tract infection)        Urine culture obtained, complete the keflex, no other antibiotics needed    Relevant Medications       LOTRISONE 1-0.05 % EX CREA    Other Relevant Orders       Urine culture (Completed)       Note: This dictation was prepared with Dragon dictation along with smaller phrase technology. Any transcriptional errors that result from this process are unintentional.

## 2013-08-29 LAB — URINE CULTURE
Colony Count: NO GROWTH
Organism ID, Bacteria: NO GROWTH

## 2013-09-02 ENCOUNTER — Telehealth: Payer: Self-pay | Admitting: *Deleted

## 2013-09-02 ENCOUNTER — Encounter: Payer: Self-pay | Admitting: *Deleted

## 2013-09-02 DIAGNOSIS — R109 Unspecified abdominal pain: Secondary | ICD-10-CM

## 2013-09-02 DIAGNOSIS — R110 Nausea: Secondary | ICD-10-CM

## 2013-09-02 NOTE — Telephone Encounter (Signed)
Send Referral to GI - [facility]   Dx chronic nausea, abd pain   Send with last note, labs and CT scan

## 2013-09-02 NOTE — Telephone Encounter (Signed)
Left message on pts vm about Referral.

## 2013-09-02 NOTE — Telephone Encounter (Signed)
Pt called stating that she still nausea and wants to know if you can refer her to someone, says she cant be feeling like this all the time somethings gotta give. PLease advise!

## 2013-09-07 ENCOUNTER — Encounter: Payer: Self-pay | Admitting: Gastroenterology

## 2013-09-10 ENCOUNTER — Ambulatory Visit (INDEPENDENT_AMBULATORY_CARE_PROVIDER_SITE_OTHER): Payer: BC Managed Care – PPO | Admitting: Gastroenterology

## 2013-09-10 ENCOUNTER — Encounter (INDEPENDENT_AMBULATORY_CARE_PROVIDER_SITE_OTHER): Payer: Self-pay

## 2013-09-10 ENCOUNTER — Encounter: Payer: Self-pay | Admitting: Gastroenterology

## 2013-09-10 VITALS — BP 113/79 | HR 68 | Temp 97.7°F | Wt 131.8 lb

## 2013-09-10 DIAGNOSIS — R112 Nausea with vomiting, unspecified: Secondary | ICD-10-CM

## 2013-09-10 MED ORDER — OMEPRAZOLE 20 MG PO CPDR: 20.0000 mg | DELAYED_RELEASE_CAPSULE | Freq: Every day | ORAL | Status: DC

## 2013-09-10 NOTE — Patient Instructions (Signed)
Start taking Prilosec 1 capsule each morning, 30 minutes before breakfast. We have provided samples.   I have ordered an ultrasound of your gallbladder to look closer for any issues.  I have also set you up for an upper endoscopy with Dr. Darrick PennaFields.   Further recommendations to follow!

## 2013-09-10 NOTE — Progress Notes (Signed)
Referring Provider: Alycia Rossetti, MD Primary Care Physician:  Vic Blackbird, MD Primary Gastroenterologist:  Dr. Oneida Alar   Chief Complaint  Patient presents with  . Abdominal Pain    HPI:   Robin Delgado presents today at the request of Dr. Buelah Manis secondary to nausea and vomiting. CT abd/pelvis without acute findings. Lipase and CMP normal. Onset end of Jan. States had noted RLQ pain originally but this has resolved. Poor appetite. 15 lbs weight loss. Occasional nausea with eating. Greasy foods worsen. No dysphagia. No reflux. Vomiting once every 4 days. Nausea in the afternoons worsens. Early satiety. Has to just lay around. Nausea main problem. Intermittent. No fever/chills. No changes in bowel habits. No melena or hematochezia. No routine NSAIDs. No aspirin powders. Nexium made nausea worse.   Past Medical History  Diagnosis Date  . Depression   . STD (sexually transmitted disease)     Trichomonas,   . CIN II (cervical intraepithelial neoplasia II) 2008    Colpo/Leep     Past Surgical History  Procedure Laterality Date  . Dilation and curettage of uterus N/A 07/09/1998    and 02/07/2003    Current Outpatient Prescriptions  Medication Sig Dispense Refill  . benzoyl peroxide 5 % gel Apply topically at bedtime.  60 g  3  . iron polysaccharides (NIFEREX) 150 MG capsule Take 1 capsule (150 mg total) by mouth 2 (two) times daily with a meal.  60 capsule  11  . norgestimate-ethinyl estradiol (ORTHO-CYCLEN,SPRINTEC,PREVIFEM) 0.25-35 MG-MCG tablet Take 1 tablet by mouth daily.      . ondansetron (ZOFRAN ODT) 8 MG disintegrating tablet Take 1 tablet (8 mg total) by mouth every 8 (eight) hours as needed for nausea or vomiting.  10 tablet  0  . omeprazole (PRILOSEC) 20 MG capsule Take 1 capsule (20 mg total) by mouth daily.  14 capsule  0   No current facility-administered medications for this visit.    Allergies as of 09/10/2013 - Review Complete 09/10/2013    Allergen Reaction Noted  . Macrobid [nitrofurantoin macrocrystal]  08/12/2012    Family History  Problem Relation Age of Onset  . Hypertension Mother   . Cancer Father     stomach   . Hypertension Brother   . Alcohol abuse Brother   . ADD / ADHD Son   . Anxiety disorder Brother   . Depression Brother   . Drug abuse Neg Hx   . Bipolar disorder Neg Hx   . Dementia Neg Hx   . OCD Neg Hx   . Paranoid behavior Neg Hx   . Schizophrenia Neg Hx   . Seizures Neg Hx   . Sexual abuse Neg Hx   . Physical abuse Neg Hx   . Diabetes Maternal Grandmother   . Colon cancer Neg Hx     History   Social History  . Marital Status: Single    Spouse Name: N/A    Number of Children: N/A  . 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 Smoking status: Never Smoker   . Smokeless tobacco: Not on file  . Alcohol Use: No  . Drug Use: No  . Sexual Activity: Yes    Birth Control/ Protection: Patch   Other Topics Concern  . Not on file   Social History Narrative  . No narrative on file    Review of Systems: Negative as mentioned in  HPI.   Physical Exam: BP 113/79  Pulse 68  Temp(Src) 97.7 F (36.5 C) (Oral)  Wt 131 lb 12.8 oz (59.784 kg)  LMP 09/10/2013 General:   Alert and oriented. Well-developed, well-nourished, pleasant and cooperative. Head:  Normocephalic and atraumatic. Eyes:  Conjunctiva pink, sclera clear, no icterus.   Conjunctiva pink. Ears:  Normal auditory acuity. Nose:  No deformity, discharge,  or lesions. Mouth:  No deformity or lesions, mucosa pink and moist.  Neck:  Supple, without mass or thyromegaly. Lungs:  Clear to auscultation bilaterally, without wheezing, rales, or rhonchi.  Heart:  S1, S2 present without murmurs noted.  Abdomen:  +BS, soft, non-tender and non-distended. Without mass or HSM. No rebound or guarding. No hernias noted. Rectal:  Deferred  Msk:  Symmetrical without gross deformities.  Normal posture. Extremities:  Without clubbing or edema. Neurologic:  Alert and  oriented x4;  grossly normal neurologically. Skin:  Intact, warm and dry without significant lesions or rashes Cervical Nodes:  No significant cervical adenopathy. Psych:  Alert and cooperative. Normal mood and affect.  Lab Results  Component Value Date   WBC 8.6 08/28/2013   HGB 12.1 08/28/2013   HCT 37.6 08/28/2013   MCV 71.6* 08/28/2013   PLT 325 08/28/2013   Lab Results  Component Value Date   ALT 26 08/28/2013   AST 19 08/28/2013   ALKPHOS 84 08/28/2013   BILITOT 0.6 08/28/2013   CT Jan 2015:  IMPRESSION:  1. No acute abnormality seen within the abdomen or pelvis to explain  the patient's symptoms.  2. Mild nonspecific haziness about the uterus; this may remain  within normal limits. Would correlate for any focal pelvic symptoms.

## 2013-09-10 NOTE — Assessment & Plan Note (Signed)
33 year old female with several month history of intermittent nausea and vomiting, early satiety, poor appetite, and weight loss. CBC, CMP unrevealing, CT negative for GI process. Nausea worsened with greasy foods; did not tolerate Nexium due to worsening of symptoms. Gallbladder remains in situ. Differentials include gastritis, biliary dyskinesia, PUD, doubt gastroparesis.   Proceed with upper endoscopy in the near future with Dr. Darrick PennaFields. The risks, benefits, and alternatives have been discussed in detail with patient. They have stated understanding and desire to proceed.  Start Prilosec once daily US of abdomen to assess gallbladder; may need HIDA

## 2013-09-14 ENCOUNTER — Encounter (HOSPITAL_COMMUNITY): Payer: Self-pay | Admitting: Pharmacy Technician

## 2013-09-14 ENCOUNTER — Telehealth: Payer: Self-pay | Admitting: *Deleted

## 2013-09-14 MED ORDER — METRONIDAZOLE 0.75 % VA GEL: 1.0000 | Freq: Two times a day (BID) | VAGINAL | Status: DC

## 2013-09-14 NOTE — Progress Notes (Signed)
cc'd to pcp 

## 2013-09-14 NOTE — Telephone Encounter (Signed)
Okay to send Metrogel for 1 week course

## 2013-09-14 NOTE — Progress Notes (Signed)
REVIEWED.  

## 2013-09-14 NOTE — Telephone Encounter (Signed)
PLEASE CALL PT. She should complete her EGD because she ahs weight loss and early satiety. Most people with gallbladder disease have NAUSEA OR VOMITING and abd pain but not weight loss..Marland Kitchen

## 2013-09-14 NOTE — Telephone Encounter (Signed)
Call placed to patient and patient made aware.   Prescription sent to pharmacy.  

## 2013-09-14 NOTE — Telephone Encounter (Signed)
Pt called stating she is have a EGD done in the am, pt said she went back to the ER over the weekend because she was throwing up, pt said she is having her u/s done Thursday for her gallbladder, pt said she would like to do that first to make sure it is not her gallbladder, pt also said she is not sure what to do about her EGD if she should go ahead and do that or get her gall bladder checked first. Please advise

## 2013-09-14 NOTE — Telephone Encounter (Signed)
Pt is aware.  

## 2013-09-14 NOTE — Telephone Encounter (Signed)
Received call from patient. Reported that she has vaginitis.   Reported that GI endoscopy scheduled for 09/15/2013 and Abd US scheduled for later in the week.   Requested to change Flagyl to Metro Gel for bacterial vaginitis.   MD please advise.

## 2013-09-15 ENCOUNTER — Ambulatory Visit (HOSPITAL_COMMUNITY)
Admission: RE | Admit: 2013-09-15 | Discharge: 2013-09-15 | Disposition: A | Discharge status: Home / Self Care | Payer: BC Managed Care – PPO | Source: Ambulatory Visit | Attending: Gastroenterology | Admitting: Gastroenterology

## 2013-09-15 ENCOUNTER — Encounter (HOSPITAL_COMMUNITY)
Admission: RE | Disposition: A | Discharge status: Home / Self Care | Payer: Self-pay | Source: Ambulatory Visit | Attending: Gastroenterology

## 2013-09-15 ENCOUNTER — Encounter (HOSPITAL_COMMUNITY): Payer: Self-pay | Admitting: *Deleted

## 2013-09-15 DIAGNOSIS — K2990 Gastroduodenitis, unspecified, without bleeding: Secondary | ICD-10-CM

## 2013-09-15 DIAGNOSIS — K2940 Chronic atrophic gastritis without bleeding: Secondary | ICD-10-CM | POA: Insufficient documentation

## 2013-09-15 DIAGNOSIS — K2970 Gastritis, unspecified, without bleeding: Secondary | ICD-10-CM

## 2013-09-15 DIAGNOSIS — F329 Major depressive disorder, single episode, unspecified: Secondary | ICD-10-CM | POA: Insufficient documentation

## 2013-09-15 DIAGNOSIS — R112 Nausea with vomiting, unspecified: Secondary | ICD-10-CM

## 2013-09-15 DIAGNOSIS — R110 Nausea: Secondary | ICD-10-CM | POA: Insufficient documentation

## 2013-09-15 DIAGNOSIS — F3289 Other specified depressive episodes: Secondary | ICD-10-CM | POA: Insufficient documentation

## 2013-09-15 DIAGNOSIS — Z79899 Other long term (current) drug therapy: Secondary | ICD-10-CM | POA: Insufficient documentation

## 2013-09-15 DIAGNOSIS — R1013 Epigastric pain: Secondary | ICD-10-CM | POA: Insufficient documentation

## 2013-09-15 HISTORY — PX: ESOPHAGOGASTRODUODENOSCOPY: SHX5428

## 2013-09-15 SURGERY — EGD (ESOPHAGOGASTRODUODENOSCOPY)
Anesthesia: Moderate Sedation

## 2013-09-15 MED ORDER — OMEPRAZOLE 20 MG PO CPDR: DELAYED_RELEASE_CAPSULE | ORAL | Status: DC

## 2013-09-15 MED ORDER — STERILE WATER FOR IRRIGATION IR SOLN
Status: DC | PRN
  Administered 2013-09-15: 11:00:00

## 2013-09-15 MED ORDER — SODIUM CHLORIDE 0.9 % IV SOLN
INTRAVENOUS | Status: DC
  Administered 2013-09-15: 10:00:00 via INTRAVENOUS

## 2013-09-15 MED ORDER — LIDOCAINE VISCOUS 2 % MT SOLN
OROMUCOSAL | Status: AC
  Filled 2013-09-15: qty 15

## 2013-09-15 MED ORDER — PROMETHAZINE HCL 25 MG/ML IJ SOLN
12.5000 mg | Freq: Once | INTRAMUSCULAR | Status: AC
  Administered 2013-09-15: 12.5 mg via INTRAVENOUS

## 2013-09-15 MED ORDER — ONDANSETRON 8 MG PO TBDP: ORAL_TABLET | ORAL | Status: DC

## 2013-09-15 MED ORDER — SODIUM CHLORIDE 0.9 % IJ SOLN
INTRAMUSCULAR | Status: AC
  Filled 2013-09-15: qty 10

## 2013-09-15 MED ORDER — MEPERIDINE HCL 100 MG/ML IJ SOLN
INTRAMUSCULAR | Status: DC | PRN
  Administered 2013-09-15 (×2): 50 mg via INTRAVENOUS

## 2013-09-15 MED ORDER — MIDAZOLAM HCL 5 MG/5ML IJ SOLN
INTRAMUSCULAR | Status: AC
  Filled 2013-09-15: qty 10

## 2013-09-15 MED ORDER — LIDOCAINE VISCOUS 2 % MT SOLN
OROMUCOSAL | Status: DC | PRN
Start: 2013-09-15 — End: 2013-09-15
  Administered 2013-09-15: 1 via OROMUCOSAL

## 2013-09-15 MED ORDER — MEPERIDINE HCL 100 MG/ML IJ SOLN
INTRAMUSCULAR | Status: AC
  Filled 2013-09-15: qty 2

## 2013-09-15 MED ORDER — MIDAZOLAM HCL 5 MG/5ML IJ SOLN
INTRAMUSCULAR | Status: DC | PRN
  Administered 2013-09-15 (×2): 2 mg via INTRAVENOUS

## 2013-09-15 MED ORDER — PROMETHAZINE HCL 25 MG/ML IJ SOLN
INTRAMUSCULAR | Status: AC
  Filled 2013-09-15: qty 1

## 2013-09-15 NOTE — Discharge Instructions (Signed)
You have mild gastritis. IN ADDITION YOU MAY HAVE NAUSEA AND STOMACH PAIN DUE TO REFLUX. I biopsied your stomach.  TAKE OMEPRAZOLE 30 MINUTES PRIOR TO MEALS TWICE DAILY FOR 3 MOS THEN ONCE DAILY FOR THE NEXT YEAR.  AVOID TRIGGERS FOR GASTRITIS. SEE INFO BELOW.  STRICTLY FOLLOW A LOW FAT DIET. SEE INFO BELOW.  FOLLOW UP IN 4 MOS.  UPPER ENDOSCOPY AFTER CARE Read the instructions outlined below and refer to this sheet in the next week. These discharge instructions provide you with general information on caring for yourself after you leave the hospital. While your treatment has been planned according to the most current medical practices available, unavoidable complications occasionally occur. If you have any problems or questions after discharge, call DR. Demetrice Amstutz, [phone].  ACTIVITY  You may resume your regular activity, but move at a slower pace for the next 24 hours.   Take frequent rest periods for the next 24 hours.   Walking will help get rid of the air and reduce the bloated feeling in your belly (abdomen).   No driving for 24 hours (because of the medicine (anesthesia) used during the test).   You may shower.   Do not sign any important legal documents or operate any machinery for 24 hours (because of the anesthesia used during the test).    NUTRITION  Drink plenty of fluids.   You may resume your normal diet as instructed by your doctor.   Begin with a light meal and progress to your normal diet. Heavy or fried foods are harder to digest and may make you feel sick to your stomach (nauseated).   Avoid alcoholic beverages for 24 hours or as instructed.    MEDICATIONS  You may resume your normal medications.   WHAT YOU CAN EXPECT TODAY  Some feelings of bloating in the abdomen.   Passage of more gas than usual.    IF YOU HAD A BIOPSY TAKEN DURING THE UPPER ENDOSCOPY:  Eat a soft diet IF YOU HAVE NAUSEA, BLOATING, ABDOMINAL PAIN, OR VOMITING.    FINDING  OUT THE RESULTS OF YOUR TEST Not all test results are available during your visit. DR. Darrick Penna WILL CALL YOU WITHIN 7 DAYS OF YOUR PROCEDUE WITH YOUR RESULTS. Do not assume everything is normal if you have not heard from DR. Norrine Ballester IN ONE WEEK, CALL HER OFFICE AT [phone].  SEEK IMMEDIATE MEDICAL ATTENTION AND CALL THE OFFICE: [phone] IF:  You have more than a spotting of blood in your stool.   Your belly is swollen (abdominal distention).   You are nauseated or vomiting.   You have a temperature over 101F.   You have abdominal pain or discomfort that is severe or gets worse throughout the day.   Gastritis  Gastritis is an inflammation (the body's way of reacting to injury and/or infection) of the stomach. It is often caused by viral or bacterial (germ) infections. It can also be caused BY ASPIRIN, BC/GOODY POWDER'S, (IBUPROFEN) MOTRIN, OR ALEVE (NAPROXEN), chemicals (including alcohol), SPICY FOODS, and medications. This illness may be associated with generalized malaise (feeling tired, not well), UPPER ABDOMINAL STOMACH cramps, and fever. One common bacterial cause of gastritis is an organism known as H. Pylori. This can be treated with antibiotics.    Low-Fat Diet  BREADS, CEREALS, PASTA, RICE, DRIED PEAS, AND BEANS These products are high in carbohydrates and most are low in fat. Therefore, they can be increased in the diet as substitutes for fatty foods. They too, however,  contain calories and should not be eaten in excess. Cereals can be eaten for snacks as well as for breakfast.  Include foods that contain fiber (fruits, vegetables, whole grains, and legumes). Research shows that fiber may lower blood cholesterol levels, especially the water-soluble fiber found in fruits, vegetables, oat products, and legumes.  FRUITS AND VEGETABLES It is good to eat fruits and vegetables. Besides being sources of fiber, both are rich in vitamins and some minerals. They help you get the  daily allowances of these nutrients. Fruits and vegetables can be used for snacks and desserts.  MEATS Limit lean meat, chicken, [place], and fish to no more than 6 ounces per day.  Beef, Pork, and Lamb Use lean cuts of beef, pork, and lamb. Lean cuts include:  Extra-lean ground beef.  Arm roast.  Sirloin tip.  Center-cut ham.  Round steak.  Loin chops.  Rump roast.  Tenderloin.  Trim all fat off the outside of meats before cooking. It is not necessary to severely decrease the intake of red meat, but lean choices should be made. Lean meat is rich in protein and contains a highly absorbable form of iron. Premenopausal women, in particular, should avoid reducing lean red meat because this could increase the risk for low red blood cells (iron-deficiency anemia).  Chicken and [place] These are good sources of protein. The fat of poultry can be reduced by removing the skin and underlying fat layers before cooking. Chicken and [place] can be substituted for lean red meat in the diet. Poultry should not be fried or covered with high-fat sauces.  Fish and Shellfish Fish is a good source of protein. Shellfish contain cholesterol, but they usually are low in saturated fatty acids. The preparation of fish is important. Like chicken and [place], they should not be fried or covered with high-fat sauces.  EGGS Egg whites contain no fat or cholesterol. They can be eaten often. Try 1 to 2 egg whites instead of whole eggs in recipes or use egg substitutes that do not contain yolk.  MILK AND DAIRY PRODUCTS Use skim or 1% milk instead of 2% or whole milk. Decrease whole milk, natural, and processed cheeses. Use nonfat or low-fat (2%) cottage cheese or low-fat cheeses made from vegetable oils. Choose nonfat or low-fat (1 to 2%) yogurt. Experiment with evaporated skim milk in recipes that call for heavy cream. Substitute low-fat yogurt or low-fat cottage cheese for sour cream in dips and salad dressings. Have at  least 2 servings of low-fat dairy products, such as 2 glasses of skim (or 1%) milk each day to help get your daily calcium intake.  FATS AND OILS Reduce the total intake of fats, especially saturated fat. Butterfat, lard, and beef fats are high in saturated fat and cholesterol. These should be avoided as much as possible. Vegetable fats do not contain cholesterol, but certain vegetable fats, such as coconut oil, palm oil, and palm kernel oil are very high in saturated fats. These should be limited. These fats are often used in bakery goods, processed foods, popcorn, oils, and nondairy creamers. Vegetable shortenings and some peanut butters contain hydrogenated oils, which are also saturated fats. Read the labels on these foods and check for saturated vegetable oils.  Unsaturated vegetable oils and fats do not raise blood cholesterol. However, they should be limited because they are fats and are high in calories. Total fat should still be limited to 30% of your daily caloric intake. Desirable liquid vegetable oils are corn oil, cottonseed  oil, olive oil, canola oil, safflower oil, soybean oil, and sunflower oil. Peanut oil is not as good, but small amounts are acceptable. Buy a heart-healthy tub margarine that has no partially hydrogenated oils in the ingredients. Mayonnaise and salad dressings often are made from unsaturated fats, but they should also be limited because of their high calorie and fat content. Seeds, nuts, peanut butter, olives, and avocados are high in fat, but the fat is mainly the unsaturated type. These foods should be limited mainly to avoid excess calories and fat.  OTHER EATING TIPS Snacks  Most sweets should be limited as snacks. They tend to be rich in calories and fats, and their caloric content outweighs their nutritional value. Some good choices in snacks are graham crackers, melba toast, soda crackers, bagels (no egg), English muffins, fruits, and vegetables. These snacks are  preferable to snack crackers, [place] fries, and chips. Popcorn should be air-popped or cooked in small amounts of liquid vegetable oil.  Desserts Eat fruit, low-fat yogurt, and fruit ices instead of pastries, cake, and cookies. Sherbet, angel food cake, gelatin dessert, frozen low-fat yogurt, or other frozen products that do not contain saturated fat (pure fruit juice bars, frozen ice pops) are also acceptable.   COOKING METHODS Choose those methods that use little or no fat. They include: Poaching.  Braising.  Steaming.  Grilling.  Baking.  Stir-frying.  Broiling.  Microwaving.  Foods can be cooked in a nonstick pan without added fat, or use a nonfat cooking spray in regular cookware. Limit fried foods and avoid frying in saturated fat. Add moisture to lean meats by using water, broth, cooking wines, and other nonfat or low-fat sauces along with the cooking methods mentioned above. Soups and stews should be chilled after cooking. The fat that forms on top after a few hours in the refrigerator should be skimmed off. When preparing meals, avoid using excess salt. Salt can contribute to raising blood pressure in some people.  EATING AWAY FROM HOME Order entres, potatoes, and vegetables without sauces or butter. When meat exceeds the size of a deck of cards (3 to 4 ounces), the rest can be taken home for another meal. Choose vegetable or fruit salads and ask for low-calorie salad dressings to be served on the side. Use dressings sparingly. Limit high-fat toppings, such as bacon, crumbled eggs, cheese, sunflower seeds, and olives. Ask for heart-healthy tub margarine instead of butter.

## 2013-09-15 NOTE — H&P (Signed)
Primary Care Physician:  Vic Blackbird, MD Primary Gastroenterologist:  Dr. Oneida Alar  Pre-Procedure History & Physical: HPI:  Robin Delgado is a 33 y.o. female here for  ABDOMINAL PAIN/DYSPEPSIA.  Past Medical History  Diagnosis Date  . Depression   . STD (sexually transmitted disease)     Trichomonas,   . CIN II (cervical intraepithelial neoplasia II) 2008    Colpo/Leep     Past Surgical History  Procedure Laterality Date  . Dilation and curettage of uterus N/A 07/09/1998    and 02/07/2003    Prior to Admission medications   Medication Sig Start Date End Date Taking? Authorizing Provider  benzoyl peroxide 5 % gel Apply topically at bedtime. 05/20/13  Yes Alycia Rossetti, MD  norgestimate-ethinyl estradiol (ORTHO-CYCLEN,SPRINTEC,PREVIFEM) 0.25-35 MG-MCG tablet Take 1 tablet by mouth daily.   Yes Historical Provider, MD  ondansetron (ZOFRAN ODT) 8 MG disintegrating tablet Take 1 tablet (8 mg total) by mouth every 8 (eight) hours as needed for nausea or vomiting. 07/30/13  Yes Hoy Morn, MD  potassium chloride (K-DUR,KLOR-CON) 10 MEQ tablet Take 10 mEq by mouth 2 (two) times daily.   Yes Historical Provider, MD  iron polysaccharides (NIFEREX) 150 MG capsule Take 1 capsule (150 mg total) by mouth 2 (two) times daily with a meal. 07/30/13   Orlena Sheldon, PA-C  LORazepam (ATIVAN) 0.5 MG tablet Take 0.5 mg by mouth at bedtime.    Historical Provider, MD  metroNIDAZOLE (METROGEL VAGINAL) 0.75 % vaginal gel Place 1 Applicatorful vaginally 2 (two) times daily. 09/14/13   Alycia Rossetti, MD  omeprazole (PRILOSEC) 20 MG capsule Take 1 capsule (20 mg total) by mouth daily. 09/10/13   Orvil Feil, NP    Allergies as of 09/10/2013 - Review Complete 09/10/2013  Allergen Reaction Noted  . Macrobid [nitrofurantoin macrocrystal]  08/12/2012    Family History  Problem Relation Age of Onset  . Hypertension Mother   . Cancer Father     stomach   . Hypertension Brother   . Alcohol abuse  Brother   . ADD / ADHD Son   . Anxiety disorder Brother   . Depression Brother   . Drug abuse Neg Hx   . Bipolar disorder Neg Hx   . Dementia Neg Hx   . OCD Neg Hx   . Paranoid behavior Neg Hx   . Schizophrenia Neg Hx   . Seizures Neg Hx   . Sexual abuse Neg Hx   . Physical abuse Neg Hx   . Diabetes Maternal Grandmother   . Colon cancer Neg Hx     History   Social History  . Marital Status: Single    Spouse Name: N/A    Number of Children: N/A  . 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 Smoking status: Never Smoker   . Smokeless tobacco: Not on file  . Alcohol Use: No  . Drug Use: No  . Sexual Activity: Yes    Birth Control/ Protection: Patch   Other Topics Concern  . Not on file   Social History Narrative  . No narrative on file    Review of Systems: See HPI, otherwise negative ROS   Physical Exam: BP 157/96  Pulse 76  Temp(Src) 97.8 F (36.6 C) (Oral)  Resp 18  Ht '5\' 2"'$  (1.575 m)  Wt 131 lb (59.421 kg)  BMI 23.95 kg/m2  SpO2 100%  LMP  09/10/2013 General:   Alert,  pleasant and cooperative in NAD Head:  Normocephalic and atraumatic. Neck:  Supple; Lungs:  Clear throughout to auscultation.    Heart:  Regular rate and rhythm. Abdomen:  Soft, nontender and nondistended. Normal bowel sounds, without guarding, and without rebound.   Neurologic:  Alert and  oriented x4;  grossly normal neurologically.  Impression/Plan:     ABDOMINAL PAIN/DYSPEPSIA.  PLAN: 1. EGD TODAY

## 2013-09-15 NOTE — Op Note (Signed)
[facility] [address] [place], [id number]   ENDOSCOPY PROCEDURE REPORT  PATIENT: Robin Delgado, Robin N.  MR#: [id number] BIRTHDATE: [birth year] , 32  yrs. old GENDER: Female  ENDOSCOPIST: Jonette EvaSandi Saia Derossett, MD REFERRED XB:[id number]:[place], M.D.  PROCEDURE DATE: 09/15/2013 PROCEDURE:   EGD w/ biopsy  INDICATIONS:Nausea.   Epigastric pain. PT HAS SIGNIFICANT STRESS. MEDICATIONS: Demerol 100 mg IV, Versed 4 mg IV, and Promethazine (Phenergan) 12.5mg  IV TOPICAL ANESTHETIC:   Viscous Xylocaine  DESCRIPTION OF PROCEDURE:     Physical exam was performed.  Informed consent was obtained from the patient after explaining the benefits, risks, and alternatives to the procedure.  The patient was connected to the monitor and placed in the left lateral position.  Continuous oxygen was provided by nasal cannula and IV medicine administered through an indwelling cannula.  After administration of sedation, the patients esophagus was intubated and the EG-[id number] ([id number])  endoscope was advanced under direct visualization to the second portion of the duodenum.  The scope was removed slowly by carefully examining the color, texture, anatomy, and integrity of the mucosa on the way out.  The patient was recovered in endoscopy and discharged home in satisfactory condition.   ESOPHAGUS: The mucosa of the esophagus appeared normal.   EXCEPT WIDE OPEN LES.   STOMACH: Mild non-erosive gastritis (inflammation) was found in the gastric antrum.  Multiple biopsies were performed using cold forceps.   DUODENUM: The duodenal mucosa showed no abnormalities in the bulb and second portion of the duodenum. COMPLICATIONS:   None  ENDOSCOPIC IMPRESSION: 1.   NAUSEA/EPIGASTRIC PAINMOST LIKELY DUE TO UNCONTROLLED GERD/GASTRITIS/STRESS 2.   MILD Non-erosive gastritis (  RECOMMENDATIONS: TAKE OMEPRAZOLE 30 MINUTES PRIOR TO MEALS TWICE DAILY FOR 3 MOS THEN ONCE DAILY FOR THE NEXT YEAR. AVOID  TRIGGERS FOR GASTRITIS. STRICTLY FOLLOW A LOW FAT DIET. AWAIT U/S. FOLLOW UP IN 4 MOS.   REPEAT EXAM:   _______________________________ Rosalie DoctoreSignedJonette Eva:  Isa Hitz, MD 09/15/2013 2:20 PM

## 2013-09-16 ENCOUNTER — Telehealth: Payer: Self-pay | Admitting: Gastroenterology

## 2013-09-16 NOTE — Telephone Encounter (Signed)
Pt returned call and was informed. She said the only foods she can eat much is [employer] and things like that. I told her she would have to stay away from greasy and spicy foods. Low fat diet mailed to her per request.

## 2013-09-16 NOTE — Telephone Encounter (Signed)
Please call pt. HER stomach Bx shows mild gastritis. Her SYMPTOMS ARE MOST LIKELY DUE TO REFLUX & GASTRITIS.    TAKE OMEPRAZOLE 30 MINUTES PRIOR TO MEALS TWICE DAILY FOR 3 MOS THEN ONCE DAILY FOR THE NEXT YEAR. AVOID TRIGGERS FOR GASTRITIS.  STRICTLY FOLLOW A LOW FAT DIET.  CALL IN ONE MONTH IF SYMPTOMS ARE NOT IMPROVED.   FOLLOW UP IN 4 MOS E30 nausea, EPIGASTRIC PAIN.

## 2013-09-16 NOTE — Telephone Encounter (Signed)
LMOM to call.

## 2013-09-17 ENCOUNTER — Ambulatory Visit (HOSPITAL_COMMUNITY)
Admission: RE | Admit: 2013-09-17 | Discharge: 2013-09-17 | Disposition: A | Discharge status: Home / Self Care | Payer: BC Managed Care – PPO | Source: Ambulatory Visit | Attending: Gastroenterology | Admitting: Gastroenterology

## 2013-09-17 ENCOUNTER — Telehealth: Payer: Self-pay | Admitting: Gastroenterology

## 2013-09-17 ENCOUNTER — Other Ambulatory Visit: Payer: Self-pay | Admitting: Gastroenterology

## 2013-09-17 DIAGNOSIS — R112 Nausea with vomiting, unspecified: Secondary | ICD-10-CM

## 2013-09-17 DIAGNOSIS — K8020 Calculus of gallbladder without cholecystitis without obstruction: Secondary | ICD-10-CM | POA: Insufficient documentation

## 2013-09-17 DIAGNOSIS — K810 Acute cholecystitis: Secondary | ICD-10-CM

## 2013-09-17 NOTE — Telephone Encounter (Signed)
Pt aware of results and her appointment with Dr. Lovell SheehanJenkins

## 2013-09-17 NOTE — Telephone Encounter (Signed)
Referral has been made to Dr. Jenkins 

## 2013-09-17 NOTE — Telephone Encounter (Signed)
Pt called today around 930 am to let us know that she had her U/S done and was told that we would have her results today at noon. She also said that the radiologist told her that she had gallstones and they needed to come out and she is very anxious to get this done. I told her that we would not have her results today that it takes at least 5-7 business days and once SF reviews her results and make her recommendations that the nurse would be calling her. Please advise.

## 2013-09-17 NOTE — Telephone Encounter (Addendum)
PLEASE CALL PT. HER U/S SHOWS GALLSTONES. SHE CAN SEE A SURGEON(JENKINS) TO DISCUSS BENEFITS V. RISKS OF GALLBLADDER SURGERY. FOLLOW A LOW FAT DIET. IF SHE EATS A HIGH FAT MEAL IT WILL STIMULATE HER GALLBLADDER TO RELEASE THE STONES AND SHE WILL HAVE ABD PAN, NAUSEA, AND VOMITING.

## 2013-09-18 ENCOUNTER — Encounter (HOSPITAL_COMMUNITY): Payer: Self-pay | Admitting: Gastroenterology

## 2013-09-21 NOTE — Telephone Encounter (Signed)
Pt has OV for 7/1 at 930 with AS

## 2013-09-22 ENCOUNTER — Encounter (HOSPITAL_COMMUNITY): Payer: Self-pay | Admitting: Pharmacy Technician

## 2013-09-22 NOTE — Patient Instructions (Signed)
Your procedure is scheduled on:  09/25/13  [facility] at [facility] AM  Call this number if you have problems the morning of surgery: [phone]   Remember:   Do not eat food or drink liquids after midnight.   Take these medicines the morning of surgery with A SIP OF WATER: birth control. You may take your Zofran if needed.   Do not wear jewelry, make-up or nail polish.  Do not wear lotions, powders, or perfumes.   Do not shave 48 hours prior to surgery. Men may shave face and neck.  Do not bring valuables to the hospital.  [facility] any belongings or valuables.               Contacts, dentures or bridgework may not be worn into surgery.  Leave suitcase in the car. After surgery it may be brought to your room.  For patients admitted to the hospital, discharge time is determined by your treatment team.               Patients discharged the day of surgery will not be allowed to drive home.    Special Instructions: Shower using CHG 1 night before surgery and the morning before surgery.  Use special wash - you have one bottle of CHG for both showers.   You should use approximately 1/2 of the bottle for each shower.   Please read over the following fact sheets that you were given: Anesthesia Post-op Instructions and Care and Recovery After Surgery     Laparoscopic Cholecystectomy Laparoscopic cholecystectomy is surgery to remove the gallbladder. The gallbladder is located in the upper right part of the abdomen, behind the liver. It is a storage sac for bile produced in the liver. Bile aids in the digestion and absorption of fats. Cholecystectomy is often done for inflammation of the gallbladder (cholecystitis). This condition is usually caused by a buildup of gallstones (cholelithiasis) in your gallbladder. Gallstones can block the flow of bile, resulting in inflammation and pain. In severe cases, emergency surgery may be required. When emergency surgery is not  required, you will have time to prepare for the procedure. Laparoscopic surgery is an alternative to open surgery. Laparoscopic surgery has a shorter recovery time. Your common bile duct may also need to be examined during the procedure. If stones are found in the common bile duct, they may be removed. LET [facility] KNOW ABOUT:  Any allergies you have.  All medicines you are taking, including vitamins, herbs, eye drops, creams, and over-the-counter medicines.  Previous problems you or members of your family have had with the use of anesthetics.  Any blood disorders you have.  Previous surgeries you have had.  Medical conditions you have. RISKS AND COMPLICATIONS Generally, this is a safe procedure. However, as with any procedure, complications can occur. Possible complications include:  Infection.  Damage to the common bile duct, nerves, arteries, veins, or other internal organs such as the stomach, liver, or intestines.  Bleeding.  A stone may remain in the common bile duct.  A bile leak from the cyst duct that is clipped when your gallbladder is removed.  The need to convert to open surgery, which requires a larger incision in the abdomen. This may be necessary if your surgeon thinks it is not safe to continue with a laparoscopic procedure. BEFORE THE PROCEDURE  Ask your health care provider about changing or stopping any regular medicines. You will need to  stop taking aspirin or blood thinners at least 5 days prior to surgery.  Do not eat or drink anything after midnight the night before surgery.  Let your health care provider know if you develop a cold or other infectious problem before surgery. PROCEDURE   You will be given medicine to make you sleep through the procedure (general anesthetic). A breathing tube will be placed in your mouth.  When you are asleep, your surgeon will make several small cuts (incisions) in your abdomen.  A thin, lighted tube  with a tiny camera on the end (laparoscope) is inserted through one of the small incisions. The camera on the laparoscope sends a picture to a TV screen in the operating room. This gives the surgeon a good view inside your abdomen.  A gas will be pumped into your abdomen. This expands your abdomen so that the surgeon has more room to perform the surgery.  Other tools needed for the procedure are inserted through the other incisions. The gallbladder is removed through one of the incisions.  After the removal of your gallbladder, the incisions will be closed with stitches, staples, or skin glue. AFTER THE PROCEDURE  You will be taken to a recovery area where your progress will be checked often.  You may be allowed to go home the same day if your pain is controlled and you can tolerate liquids. Document Released: 06/25/2005 Document Revised: 04/15/2013 Document Reviewed: 02/04/2013 [facility] Patient Information 2014 [place], [place].    PATIENT INSTRUCTIONS POST-ANESTHESIA  IMMEDIATELY FOLLOWING SURGERY:  Do not drive or operate machinery for the first twenty four hours after surgery.  Do not make any important decisions for twenty four hours after surgery or while taking narcotic pain medications or sedatives.  If you develop intractable nausea and vomiting or a severe headache please notify your doctor immediately.  FOLLOW-UP:  Please make an appointment with your surgeon as instructed. You do not need to follow up with anesthesia unless specifically instructed to do so.  WOUND CARE INSTRUCTIONS (if applicable):  Keep a dry clean dressing on the anesthesia/puncture wound site if there is drainage.  Once the wound has quit draining you may leave it open to air.  Generally you should leave the bandage intact for twenty four hours unless there is drainage.  If the epidural site drains for more than 36-48 hours please call the anesthesia department.  QUESTIONS?:  Please feel free to call your  physician or the hospital operator if you have any questions, and they will be happy to assist you.

## 2013-09-23 ENCOUNTER — Encounter (HOSPITAL_COMMUNITY)
Admission: RE | Admit: 2013-09-23 | Discharge: 2013-09-23 | Disposition: A | Discharge status: Home / Self Care | Payer: BC Managed Care – PPO | Source: Ambulatory Visit | Attending: General Surgery | Admitting: General Surgery

## 2013-09-23 ENCOUNTER — Encounter (HOSPITAL_COMMUNITY): Payer: Self-pay

## 2013-09-23 HISTORY — DX: Anemia, unspecified: D64.9

## 2013-09-23 LAB — CBC WITH DIFFERENTIAL/PLATELET
Basophils Absolute: 0 10*3/uL (ref 0.0–0.1)
Basophils Relative: 0 % (ref 0–1)
Eosinophils Absolute: 0.1 10*3/uL (ref 0.0–0.7)
Eosinophils Relative: 2 % (ref 0–5)
HCT: 36.9 % (ref 36.0–46.0)
Hemoglobin: 11.8 g/dL — ABNORMAL LOW (ref 12.0–15.0)
Lymphocytes Relative: 35 % (ref 12–46)
Lymphs Abs: 2.6 10*3/uL (ref 0.7–4.0)
MCH: 23.5 pg — ABNORMAL LOW (ref 26.0–34.0)
MCHC: 32 g/dL (ref 30.0–36.0)
MCV: 73.5 fL — ABNORMAL LOW (ref 78.0–100.0)
Monocytes Absolute: 0.3 10*3/uL (ref 0.1–1.0)
Monocytes Relative: 4 % (ref 3–12)
Neutro Abs: 4.5 10*3/uL (ref 1.7–7.7)
Neutrophils Relative %: 59 % (ref 43–77)
Platelets: 272 10*3/uL (ref 150–400)
RBC: 5.02 MIL/uL (ref 3.87–5.11)
RDW: 15.4 % (ref 11.5–15.5)
WBC: 7.5 10*3/uL (ref 4.0–10.5)

## 2013-09-23 LAB — BASIC METABOLIC PANEL
BUN: 6 mg/dL (ref 6–23)
CO2: 25 mEq/L (ref 19–32)
Calcium: 9.2 mg/dL (ref 8.4–10.5)
Chloride: 102 mEq/L (ref 96–112)
Creatinine, Ser: 0.68 mg/dL (ref 0.50–1.10)
GFR calc Af Amer: >90 mL/min (ref >90)
GFR calc non Af Amer: >90 mL/min (ref >90)
Glucose, Bld: 105 mg/dL — ABNORMAL HIGH (ref 70–99)
Potassium: 3.4 mEq/L — ABNORMAL LOW (ref 3.7–5.3)
Sodium: 141 mEq/L (ref 137–147)

## 2013-09-23 LAB — HEPATIC FUNCTION PANEL
ALT: 25 U/L (ref 0–35)
AST: 23 U/L (ref 0–37)
Albumin: 3.5 g/dL (ref 3.5–5.2)
Alkaline Phosphatase: 78 U/L (ref 39–117)
Bilirubin, Direct: <0.2 mg/dL (ref 0.0–0.3)
Total Bilirubin: 0.5 mg/dL (ref 0.3–1.2)
Total Protein: 7.3 g/dL (ref 6.0–8.3)

## 2013-09-23 LAB — HCG, SERUM, QUALITATIVE: Preg, Serum: NEGATIVE

## 2013-09-23 MED ORDER — CHLORHEXIDINE GLUCONATE 4 % EX LIQD: 1.0000 "application " | Freq: Once | CUTANEOUS | Status: DC

## 2013-09-23 NOTE — H&P (Signed)
  NTS SOAP Note  Vital Signs:  Vitals as of: 09/22/2013: Systolic 134: Diastolic 79: Heart Rate 82: Temp 98.74F: Height 235ft 3in: Weight 128Lbs 0 Ounces: BMI 22.67  BMI : 22.67 kg/m2  Subjective: This 33 Years 258 Months old Female presents for of abdominal pain secondary to cholelithiasis.  Has been having right upper quadrant abdominal pain, nausea, and fatty food intolerance for some time now.  No fever, chills, jaundice.CT scan of abdomen shows cholelithiasis.  Review of Symptoms:  Constitutional:  fatigue Head:unremarkable    Eyes:unremarkable   Nose/Mouth/Throat:unremarkable Cardiovascular:  unremarkable   Respiratory:unremarkable   Gastrointestin    abdominal pain,nausea Genitourinary:unremarkable     Musculoskeletal:unremarkable   Skin:unremarkable Hematolgic/Lymphatic:unremarkable     Allergic/Immunologic:unremarkable     Past Medical History:    Reviewed  Past Medical History  Surgical History: d and c Psychiatric History:  Depression Allergies: macrobid Medications: niferex, zofran, BCP, nexium    Social History:Reviewed  Social History  Preferred Language: English Race:  White Ethnicity: Not Hispanic / Latino Age: 33 Years 8 Months Marital Status:  M Alcohol: unknown   Smoking Status: Unknown if ever smoked reviewed on 09/22/2013 Functional Status reviewed on 09/22/2013 ------------------------------------------------ Bathing: Normal Cooking: Normal Dressing: Normal Driving: Normal Eating: Normal Managing Meds: Normal Oral Care: Normal Shopping: Normal Toileting: Normal Transferring: Normal Walking: Normal Cognitive Status reviewed on 09/22/2013 ------------------------------------------------ Attention: Normal Decision Making: Normal Language: Normal Memory: Normal Motor: Normal Perception: Normal Problem Solving: Normal Visual and Spatial: Normal   Family History:  Reviewed  Family Health  History Mother, Living; Healthy; healthy Father, Deceased; Stomach cancer;     Objective Information: General:  Well appearing, well nourished in no distress.   no scleral icterus Heart:  RRR, no murmur or gallop.  Normal S1, S2.  No S3, S4.  Lungs:    CTA bilaterally, no wheezes, rhonchi, rales.  Breathing unlabored. Abdomen:Soft, NT/ND, no HSM, no masses.  Assessment:Biliary colic, cholelithiasis  Diagnoses: 574.20 Gallstone (Calculus of gallbladder without cholecystitis without obstruction)  Procedures: [id number] - OFFICE OUTPATIENT NEW 30 MINUTES    Plan:  Scheduled for laparoscopic cholecystectomy on 09/25/13.   Patient Education:Alternative treatments to surgery were discussed with patient (and family).  Risks and benefits  of procedure including bleeding, infection, hepatobiliary injury, and the possibility of an open procedure were fully explained to the patient (and family) who gave informed consent. Patient/family questions were addressed.  Follow-up:Pending Surgery

## 2013-09-24 ENCOUNTER — Ambulatory Visit: Payer: Self-pay | Admitting: Gastroenterology

## 2013-09-25 ENCOUNTER — Encounter (HOSPITAL_COMMUNITY): Payer: BC Managed Care – PPO | Admitting: Anesthesiology

## 2013-09-25 ENCOUNTER — Ambulatory Visit (HOSPITAL_COMMUNITY)
Admission: RE | Admit: 2013-09-25 | Discharge: 2013-09-25 | Disposition: A | Discharge status: Home / Self Care | Payer: BC Managed Care – PPO | Source: Ambulatory Visit | Attending: General Surgery | Admitting: General Surgery

## 2013-09-25 ENCOUNTER — Ambulatory Visit (HOSPITAL_COMMUNITY): Payer: BC Managed Care – PPO | Admitting: Anesthesiology

## 2013-09-25 ENCOUNTER — Encounter (HOSPITAL_COMMUNITY)
Admission: RE | Disposition: A | Discharge status: Home / Self Care | Payer: Self-pay | Source: Ambulatory Visit | Attending: General Surgery

## 2013-09-25 ENCOUNTER — Encounter (HOSPITAL_COMMUNITY): Payer: Self-pay | Admitting: *Deleted

## 2013-09-25 DIAGNOSIS — F329 Major depressive disorder, single episode, unspecified: Secondary | ICD-10-CM | POA: Insufficient documentation

## 2013-09-25 DIAGNOSIS — Z79899 Other long term (current) drug therapy: Secondary | ICD-10-CM | POA: Insufficient documentation

## 2013-09-25 DIAGNOSIS — K8010 Calculus of gallbladder with chronic cholecystitis without obstruction: Secondary | ICD-10-CM | POA: Insufficient documentation

## 2013-09-25 DIAGNOSIS — F3289 Other specified depressive episodes: Secondary | ICD-10-CM | POA: Insufficient documentation

## 2013-09-25 HISTORY — PX: CHOLECYSTECTOMY: SHX55

## 2013-09-25 SURGERY — LAPAROSCOPIC CHOLECYSTECTOMY
Anesthesia: General | Site: Abdomen

## 2013-09-25 MED ORDER — GLYCOPYRROLATE 0.2 MG/ML IJ SOLN
INTRAMUSCULAR | Status: AC
  Filled 2013-09-25: qty 1

## 2013-09-25 MED ORDER — FENTANYL CITRATE 0.05 MG/ML IJ SOLN
INTRAMUSCULAR | Status: DC | PRN
Start: 2013-09-25 — End: 2013-09-25
  Administered 2013-09-25 (×3): 50 ug via INTRAVENOUS
  Administered 2013-09-25: 100 ug via INTRAVENOUS

## 2013-09-25 MED ORDER — MIDAZOLAM HCL 2 MG/2ML IJ SOLN
INTRAMUSCULAR | Status: AC
  Filled 2013-09-25: qty 2

## 2013-09-25 MED ORDER — PROPOFOL 10 MG/ML IV EMUL
INTRAVENOUS | Status: AC
  Filled 2013-09-25: qty 20

## 2013-09-25 MED ORDER — FENTANYL CITRATE 0.05 MG/ML IJ SOLN
INTRAMUSCULAR | Status: AC
  Filled 2013-09-25: qty 5

## 2013-09-25 MED ORDER — LIDOCAINE HCL (PF) 1 % IJ SOLN
INTRAMUSCULAR | Status: AC
  Filled 2013-09-25: qty 5

## 2013-09-25 MED ORDER — PROPOFOL 10 MG/ML IV BOLUS
INTRAVENOUS | Status: DC | PRN
  Administered 2013-09-25: 30 mg via INTRAVENOUS
  Administered 2013-09-25: 150 mg via INTRAVENOUS

## 2013-09-25 MED ORDER — GLYCOPYRROLATE 0.2 MG/ML IJ SOLN
INTRAMUSCULAR | Status: AC
  Filled 2013-09-25: qty 3

## 2013-09-25 MED ORDER — LACTATED RINGERS IV SOLN
INTRAVENOUS | Status: DC
Start: 2013-09-25 — End: 2013-09-25
  Administered 2013-09-25: 1000 mL via INTRAVENOUS

## 2013-09-25 MED ORDER — DEXTROSE 5 % IV SOLN
2.0000 g | INTRAVENOUS | Status: AC
  Administered 2013-09-25: 2 g via INTRAVENOUS

## 2013-09-25 MED ORDER — NEOSTIGMINE METHYLSULFATE 1 MG/ML IJ SOLN
INTRAMUSCULAR | Status: DC | PRN
  Administered 2013-09-25: 1 mg via INTRAVENOUS
  Administered 2013-09-25: 3 mg via INTRAVENOUS

## 2013-09-25 MED ORDER — GLYCOPYRROLATE 0.2 MG/ML IJ SOLN
INTRAMUSCULAR | Status: AC
  Filled 2013-09-25: qty 2

## 2013-09-25 MED ORDER — POVIDONE-IODINE 10 % OINT PACKET
TOPICAL_OINTMENT | CUTANEOUS | Status: DC | PRN
  Administered 2013-09-25: 2 via TOPICAL

## 2013-09-25 MED ORDER — POVIDONE-IODINE 10 % EX OINT
TOPICAL_OINTMENT | CUTANEOUS | Status: AC
  Filled 2013-09-25: qty 2

## 2013-09-25 MED ORDER — ROCURONIUM BROMIDE 50 MG/5ML IV SOLN
INTRAVENOUS | Status: AC
  Filled 2013-09-25: qty 1

## 2013-09-25 MED ORDER — ONDANSETRON HCL 4 MG/2ML IJ SOLN
INTRAMUSCULAR | Status: AC
  Filled 2013-09-25: qty 2

## 2013-09-25 MED ORDER — MIDAZOLAM HCL 2 MG/2ML IJ SOLN
1.0000 mg | INTRAMUSCULAR | Status: AC | PRN
  Administered 2013-09-25 (×3): 2 mg via INTRAVENOUS
  Filled 2013-09-25 (×2): qty 2

## 2013-09-25 MED ORDER — GLYCOPYRROLATE 0.2 MG/ML IJ SOLN
INTRAMUSCULAR | Status: DC | PRN
  Administered 2013-09-25: 0.6 mg via INTRAVENOUS
  Administered 2013-09-25: 0.2 mg via INTRAVENOUS

## 2013-09-25 MED ORDER — FENTANYL CITRATE 0.05 MG/ML IJ SOLN: 25.0000 ug | INTRAMUSCULAR | Status: DC | PRN

## 2013-09-25 MED ORDER — LIDOCAINE HCL (CARDIAC) 10 MG/ML IV SOLN
INTRAVENOUS | Status: DC | PRN
  Administered 2013-09-25: 20 mg via INTRAVENOUS

## 2013-09-25 MED ORDER — OXYCODONE-ACETAMINOPHEN 7.5-325 MG PO TABS: 1.0000 | ORAL_TABLET | ORAL | Status: DC | PRN

## 2013-09-25 MED ORDER — ROCURONIUM BROMIDE 100 MG/10ML IV SOLN
INTRAVENOUS | Status: DC | PRN
  Administered 2013-09-25: 30 mg via INTRAVENOUS

## 2013-09-25 MED ORDER — BUPIVACAINE HCL (PF) 0.5 % IJ SOLN
INTRAMUSCULAR | Status: AC
  Filled 2013-09-25: qty 30

## 2013-09-25 MED ORDER — BUPIVACAINE HCL (PF) 0.5 % IJ SOLN
INTRAMUSCULAR | Status: DC | PRN
  Administered 2013-09-25: 8 mL

## 2013-09-25 MED ORDER — KETOROLAC TROMETHAMINE 30 MG/ML IJ SOLN
INTRAMUSCULAR | Status: AC
  Filled 2013-09-25: qty 1

## 2013-09-25 MED ORDER — GLYCOPYRROLATE 0.2 MG/ML IJ SOLN
0.2000 mg | Freq: Once | INTRAMUSCULAR | Status: AC
  Administered 2013-09-25: 0.2 mg via INTRAVENOUS

## 2013-09-25 MED ORDER — DEXTROSE 5 % IV SOLN
INTRAVENOUS | Status: AC
  Filled 2013-09-25: qty 2

## 2013-09-25 MED ORDER — ONDANSETRON HCL 4 MG/2ML IJ SOLN: 4.0000 mg | Freq: Once | INTRAMUSCULAR | Status: DC | PRN

## 2013-09-25 MED ORDER — KETOROLAC TROMETHAMINE 30 MG/ML IJ SOLN
30.0000 mg | Freq: Once | INTRAMUSCULAR | Status: AC
  Administered 2013-09-25: 30 mg via INTRAVENOUS

## 2013-09-25 MED ORDER — ONDANSETRON HCL 4 MG/2ML IJ SOLN
4.0000 mg | Freq: Once | INTRAMUSCULAR | Status: AC
  Administered 2013-09-25: 4 mg via INTRAVENOUS

## 2013-09-25 MED ORDER — HEMOSTATIC AGENTS (NO CHARGE) OPTIME
TOPICAL | Status: DC | PRN
  Administered 2013-09-25: 1 via TOPICAL

## 2013-09-25 MED ORDER — SODIUM CHLORIDE 0.9 % IR SOLN
Status: DC | PRN
  Administered 2013-09-25: 1000 mL

## 2013-09-25 SURGICAL SUPPLY — 45 items
APPLIER CLIP LAPSCP 10X32 DD (CLIP) ×2 IMPLANT
BAG HAMPER (MISCELLANEOUS) ×2 IMPLANT
BAG SPEC RTRVL LRG 6X4 10 (ENDOMECHANICALS) ×1
CLOTH BEACON ORANGE TIMEOUT ST (SAFETY) ×2 IMPLANT
COVER LIGHT HANDLE STERIS (MISCELLANEOUS) ×4 IMPLANT
DECANTER SPIKE VIAL GLASS SM (MISCELLANEOUS) ×2 IMPLANT
DURAPREP 26ML APPLICATOR (WOUND CARE) ×2 IMPLANT
ELECT REM PT RETURN 9FT ADLT (ELECTROSURGICAL) ×2
ELECTRODE REM PT RTRN 9FT ADLT (ELECTROSURGICAL) ×1 IMPLANT
FILTER SMOKE EVAC LAPAROSHD (FILTER) ×2 IMPLANT
FORMALIN 10 PREFIL 120ML (MISCELLANEOUS) ×2 IMPLANT
GLOVE BIO SURGEON STRL SZ7.5 (GLOVE) ×2 IMPLANT
GLOVE BIOGEL PI IND STRL 7.0 (GLOVE) ×2 IMPLANT
GLOVE BIOGEL PI IND STRL 8 (GLOVE) ×1 IMPLANT
GLOVE BIOGEL PI INDICATOR 7.0 (GLOVE) ×2
GLOVE BIOGEL PI INDICATOR 8 (GLOVE) ×1
GLOVE ECLIPSE 7.0 STRL STRAW (GLOVE) ×1 IMPLANT
GLOVE EXAM NITRILE MD LF STRL (GLOVE) ×1 IMPLANT
GLOVE SS BIOGEL STRL SZ 6.5 (GLOVE) IMPLANT
GLOVE SUPERSENSE BIOGEL SZ 6.5 (GLOVE) ×1
GOWN STRL REUS W/TWL LRG LVL3 (GOWN DISPOSABLE) ×6 IMPLANT
HEMOSTAT SNOW SURGICEL 2X4 (HEMOSTASIS) ×2 IMPLANT
INST SET LAPROSCOPIC AP (KITS) ×2 IMPLANT
IV NS IRRIG 3000ML ARTHROMATIC (IV SOLUTION) IMPLANT
KIT ROOM TURNOVER APOR (KITS) ×2 IMPLANT
MANIFOLD NEPTUNE II (INSTRUMENTS) ×2 IMPLANT
NDL INSUFFLATION 14GA 120MM (NEEDLE) ×1 IMPLANT
NEEDLE INSUFFLATION 14GA 120MM (NEEDLE) ×2 IMPLANT
NS IRRIG 1000ML POUR BTL (IV SOLUTION) ×2 IMPLANT
PACK LAP CHOLE LZT030E (CUSTOM PROCEDURE TRAY) ×2 IMPLANT
PAD ARMBOARD 7.5X6 YLW CONV (MISCELLANEOUS) ×2 IMPLANT
POUCH SPECIMEN RETRIEVAL 10MM (ENDOMECHANICALS) ×2 IMPLANT
SET BASIN LINEN APH (SET/KITS/TRAYS/PACK) ×2 IMPLANT
SET TUBE IRRIG SUCTION NO TIP (IRRIGATION / IRRIGATOR) IMPLANT
SLEEVE ENDOPATH XCEL 5M (ENDOMECHANICALS) ×2 IMPLANT
SPONGE GAUZE 2X2 8PLY STRL LF (GAUZE/BANDAGES/DRESSINGS) ×8 IMPLANT
STAPLER VISISTAT (STAPLE) ×2 IMPLANT
SUT VICRYL 0 UR6 27IN ABS (SUTURE) ×2 IMPLANT
TAPE CLOTH SURG 4X10 WHT LF (GAUZE/BANDAGES/DRESSINGS) ×2 IMPLANT
TROCAR ENDO BLADELESS 11MM (ENDOMECHANICALS) ×2 IMPLANT
TROCAR XCEL NON-BLD 5MMX100MML (ENDOMECHANICALS) ×2 IMPLANT
TROCAR XCEL UNIV SLVE 11M 100M (ENDOMECHANICALS) ×2 IMPLANT
TUBING INSUFFLATION (TUBING) ×2 IMPLANT
WARMER LAPAROSCOPE (MISCELLANEOUS) ×2 IMPLANT
YANKAUER SUCT 12FT TUBE ARGYLE (SUCTIONS) ×2 IMPLANT

## 2013-09-25 NOTE — Discharge Instructions (Signed)
Laparoscopic Cholecystectomy, Care After °Refer to this sheet in the next few weeks. These instructions provide you with information on caring for yourself after your procedure. Your health care provider may also give you more specific instructions. Your treatment has been planned according to current medical practices, but problems sometimes occur. Call your health care provider if you have any problems or questions after your procedure. °WHAT TO EXPECT AFTER THE PROCEDURE °After your procedure, it is typical to have the following: °· Pain at your incision sites. You will be given pain medicines to control the pain. °· Mild nausea or vomiting. This should improve after the first 24 hours. °· Bloating and possibly shoulder pain from the gas used during the procedure. This will improve after the first 24 hours. °HOME CARE INSTRUCTIONS  °· Change bandages (dressings) as directed by your health care provider. °· Keep the wound dry and clean. You may wash the wound gently with soap and water. Gently blot or dab the area dry. °· Do not take baths or use swimming pools or hot tubs for 2 weeks or until your health care provider approves. °· Only take over-the-counter or prescription medicines as directed by your health care provider. °· Continue your normal diet as directed by your health care provider. °· Do not lift anything heavier than 10 pounds (4.5 kg) until your health care provider approves. °· Do not play contact sports for 1 week or until your health care provider approves. °SEEK MEDICAL CARE IF:  °· You have redness, swelling, or increasing pain in the wound. °· You notice yellowish-white fluid (pus) coming from the wound. °· You have drainage from the wound that lasts longer than 1 day. °· You notice a bad smell coming from the wound or dressing. °· Your surgical cuts (incisions) break open. °SEEK IMMEDIATE MEDICAL CARE IF:  °· You develop a rash. °· You have difficulty breathing. °· You have chest pain. °· You  have a fever. °· You have increasing pain in the shoulders (shoulder strap areas). °· You have dizzy episodes or faint while standing. °· You have severe abdominal pain. °· You feel sick to your stomach (nauseous) or throw up (vomit) and this lasts for more than 1 day. °Document Released: 06/25/2005 Document Revised: 04/15/2013 Document Reviewed: 02/04/2013 °ExitCare® Patient Information ©2014 [employer]. ° °

## 2013-09-25 NOTE — Anesthesia Procedure Notes (Signed)
Procedure Name: Intubation Date/Time: 09/25/2013 9:35 AM Performed by: Franco NonesYATES, Abbie Jablon S Pre-anesthesia Checklist: Patient identified, Patient being monitored, Timeout performed, Emergency Drugs available and Suction available Patient Re-evaluated:Patient Re-evaluated prior to inductionOxygen Delivery Method: Circle System Utilized Preoxygenation: Pre-oxygenation with 100% oxygen Intubation Type: IV induction Ventilation: Mask ventilation without difficulty and Oral airway inserted - appropriate to patient size Laryngoscope Size: Hyacinth MeekerMiller and 2 Grade View: Grade I Tube type: Oral Tube size: 7.0 mm Number of attempts: 1 Airway Equipment and Method: stylet Placement Confirmation: ETT inserted through vocal cords under direct vision,  positive ETCO2 and breath sounds checked- equal and bilateral Secured at: 21 cm Tube secured with: Tape Dental Injury: Teeth and Oropharynx as per pre-operative assessment

## 2013-09-25 NOTE — Transfer of Care (Signed)
Immediate Anesthesia Transfer of Care Note  Patient: Robin Delgado  Procedure(s) Performed: Procedure(s) (LRB): LAPAROSCOPIC CHOLECYSTECTOMY (N/A)  Patient Location: PACU  Anesthesia Type: General  Level of Consciousness: awake  Airway & Oxygen Therapy: Patient Spontanous Breathing and non-rebreather face mask  Post-op Assessment: Report given to PACU RN, Post -op Vital signs reviewed and stable and Patient moving all extremities  Post vital signs: Reviewed and stable  Complications: No apparent anesthesia complications

## 2013-09-25 NOTE — Anesthesia Preprocedure Evaluation (Signed)
Anesthesia Evaluation  Patient identified by MRN, date of birth, ID band Patient awake    Reviewed: Allergy & Precautions, H&P , NPO status , Patient's Chart, lab work & pertinent test results  Airway Mallampati: II  TM Distance: >3 FB Neck ROM: Full    Dental  (+) Teeth Intact   Pulmonary neg pulmonary ROS,  breath sounds clear to auscultation        Cardiovascular negative cardio ROS  Rhythm:Regular Rate:Normal     Neuro/Psych PSYCHIATRIC DISORDERS Depression Bipolar Disorder    GI/Hepatic negative GI ROS,   Endo/Other    Renal/GU      Musculoskeletal   Abdominal   Peds  Hematology  (+) anemia ,   Anesthesia Other Findings   Reproductive/Obstetrics                             Anesthesia Physical Anesthesia Plan  ASA: II  Anesthesia Plan: General   Post-op Pain Management:    Induction: Intravenous  Airway Management Planned: Oral ETT  Additional Equipment:   Intra-op Plan:   Post-operative Plan: Extubation in OR  Informed Consent: I have reviewed the patients History and Physical, chart, labs and discussed the procedure including the risks, benefits and alternatives for the proposed anesthesia with the patient or authorized representative who has indicated his/her understanding and acceptance.     Plan Discussed with:   Anesthesia Plan Comments:         Anesthesia Quick Evaluation  

## 2013-09-25 NOTE — Interval H&P Note (Signed)
History and Physical Interval Note:  09/25/2013 8:54 AM  Robin Delgado  has presented today for surgery, with the diagnosis of cholelithiasis  The various methods of treatment have been discussed with the patient and family. After consideration of risks, benefits and other options for treatment, the patient has consented to  Procedure(s): LAPAROSCOPIC CHOLECYSTECTOMY (N/A) as a surgical intervention .  The patient's history has been reviewed, patient examined, no change in status, stable for surgery.  I have reviewed the patient's chart and labs.  Questions were answered to the patient's satisfaction.     Franky MachoJENKINS,Makenleigh Crownover A

## 2013-09-25 NOTE — Op Note (Signed)
Patient:  Robin Delgado  DOB:  11/29/1980  MRN:  [id number]   Preop Diagnosis:  Cholecystitis, cholelithiasis  Postop Diagnosis:  Same  Procedure:  Laparoscopic cholecystectomy  Surgeon:  Franky MachoMark Kayleen Alig, M.D.  Anes:  General endotracheal  Indications:  Patient is a 33 year old black female presents with biliary colic secondary to cholelithiasis. The risks and benefits of the procedure including bleeding, infection, hepatobiliary, and the possibility of an open procedure were fully explained to the patient, who gave informed consent.  Procedure note:  The patient is placed the supine position. After induction of general endotracheal anesthesia, the abdomen was prepped and draped using usual sterile technique with DuraPrep. Surgical site confirmation was performed.  An infraumbilical incision was made down to the fascia. A Veress needle was introduced into the abdominal cavity and confirmation of placement was done using the saline drop test. The abdomen was then insufflated to 16 mm mercury pressure. An 11 mm trocar was introduced into the abdominal cavity under direct visualization without difficulty. The patient was placed in reverse Trendelenburg position and additional 11 mm trocar was placed the epigastric region and 5 mm trochars were placed the right upper quadrant right flank regions. The liver was inspected and noted to be within normal limits. The gallbladder was retracted in a dynamic fashion in order to expose the triangle of Calot. The cystic duct was first identified. Its juncture to the infundibulum was fully identified. Endoclips were placed proximally and distally on the cystic duct, and the cystic duct was divided. This was likewise done cystic artery. The gallbladder was then freed away from the gallbladder fossa using Bovie electrocautery. The gallbladder was delivered through the epigastric trocar site using an Endo Catch bag. The gallbladder fossa was inspected and no  abnormal bleeding or bile leakage was noted. Surgicel is placed the gallbladder fossa. All fluid and air were then evacuated from the abdominal cavity prior to removal of the trochars.  All wounds were irrigated with normal saline. All wounds were injected with 0.5% Sensorcaine. The infraumbilical fashion as well as epigastric fascia were reapproximated using 0 Vicryl interrupted sutures. All skin incisions were closed using staples. Betadine ointment and dry sterile dressings were applied.  All tape and needle counts were correct at the end of the procedure. Patient was extubated in the operating room and transferred to PACU in stable condition.  Complications:  None  EBL:  Minimal  Specimen:  Gallbladder

## 2013-09-25 NOTE — Anesthesia Postprocedure Evaluation (Signed)
Anesthesia Post Note  Patient: Vernell LeepCynthia N Kittel  Procedure(s) Performed: Procedure(s) (LRB): LAPAROSCOPIC CHOLECYSTECTOMY (N/A)  Anesthesia type: General  Patient location: PACU  Post pain: Pain level controlled  Post assessment: Post-op Vital signs reviewed, Patient's Cardiovascular Status Stable, Respiratory Function Stable, Patent Airway, No signs of Nausea or vomiting and Pain level controlled  Last Vitals:  Filed Vitals:   09/25/13 1019  BP: 133/85  Pulse: 104  Temp: 36.6 C  Resp: 20    Post vital signs: Reviewed and stable  Level of consciousness: awake and alert   Complications: No apparent anesthesia complications

## 2013-09-28 ENCOUNTER — Encounter (HOSPITAL_COMMUNITY): Payer: Self-pay | Admitting: General Surgery

## 2013-10-01 ENCOUNTER — Telehealth: Payer: Self-pay

## 2013-10-01 NOTE — Telephone Encounter (Signed)
Pt is aware of what [facility] said 

## 2013-10-01 NOTE — Telephone Encounter (Signed)
She had her gallbladder taken out Friday. She is nausea and when she eats she is going straight the bathroom. She can not eat. She would like to be seen today if she can. She called the surgeon and he told her not to eat. Please advise

## 2013-10-01 NOTE — Telephone Encounter (Signed)
PLEASE CALL PT. She should call her surgeon AGAIN. She is in her postop period and until she is released by them we cannot see her. She can follow a full liquid diet FOR 3 DAYS if she has nausea and THEN she avoid fatty foods. [email or web address] HAS HANDOUT FOR DIET INSTRUCTIONS.

## 2013-10-07 NOTE — Telephone Encounter (Signed)
Patient called back and stated that the Surgeon has released her, but she is c/o of nausea and vomiting still, I offered her an appointment April 27th first available and she stated she would call her PCP for something.

## 2013-10-07 NOTE — Telephone Encounter (Signed)
Pt is calling today because she is still hurting like before her surgery and wants to be seen. I told her that we could not see her until the surgeon released her. She is going to call Dr.Jenkins to see if he has released her. Please advise

## 2013-10-07 NOTE — Telephone Encounter (Signed)
REVIEWED. AGREE. 

## 2013-10-08 MED ORDER — ONDANSETRON 8 MG PO TBDP: ORAL_TABLET | ORAL | Status: DC

## 2013-10-08 NOTE — Telephone Encounter (Signed)
REVIEWED.  

## 2013-10-08 NOTE — Addendum Note (Signed)
Addended by: West BaliFIELDS, Markez Dowland L on: 10/08/2013 07:52 AM   Modules accepted: Orders

## 2013-10-08 NOTE — Telephone Encounter (Signed)
PLEASE CALL PT. WE WILL PUT HER ON A CANCELLATION LIST AND TRY TO GET HER IN SOONER. SHE SHOULD MAKE SURE SHE'S TAKING HER OMEPRAZOLE 30 MINS BEFORE MEALS BID. A RX FOR ZOFRAN HAS BEEN SENT TO [facility] IN [place], [place]

## 2013-10-19 ENCOUNTER — Encounter: Payer: Self-pay | Admitting: General Practice

## 2013-10-26 ENCOUNTER — Telehealth: Payer: Self-pay | Admitting: *Deleted

## 2013-10-26 NOTE — Telephone Encounter (Signed)
Pt returned call

## 2013-10-26 NOTE — Telephone Encounter (Signed)
LMOM to call back

## 2013-10-26 NOTE — Telephone Encounter (Signed)
I called Pt and told her that [facility] had an opening for Wednesday but she stated that she was better now.

## 2013-11-10 ENCOUNTER — Encounter: Payer: Self-pay | Admitting: Family Medicine

## 2013-11-10 ENCOUNTER — Ambulatory Visit (INDEPENDENT_AMBULATORY_CARE_PROVIDER_SITE_OTHER): Payer: BC Managed Care – PPO | Admitting: Family Medicine

## 2013-11-10 VITALS — BP 120/62 | HR 82 | Temp 98.3°F | Resp 16 | Ht 62.5 in | Wt 130.0 lb

## 2013-11-10 DIAGNOSIS — F411 Generalized anxiety disorder: Secondary | ICD-10-CM

## 2013-11-10 DIAGNOSIS — G4700 Insomnia, unspecified: Secondary | ICD-10-CM

## 2013-11-10 DIAGNOSIS — F3130 Bipolar disorder, current episode depressed, mild or moderate severity, unspecified: Secondary | ICD-10-CM

## 2013-11-10 DIAGNOSIS — R634 Abnormal weight loss: Secondary | ICD-10-CM

## 2013-11-10 MED ORDER — MIRTAZAPINE 15 MG PO TABS: 15.0000 mg | ORAL_TABLET | Freq: Every day | ORAL | Status: DC

## 2013-11-10 MED ORDER — ONDANSETRON 8 MG PO TBDP: ORAL_TABLET | ORAL | Status: DC

## 2013-11-10 MED ORDER — ARIPIPRAZOLE 10 MG PO TABS: 10.0000 mg | ORAL_TABLET | Freq: Every day | ORAL | Status: DC

## 2013-11-10 NOTE — Assessment & Plan Note (Signed)
She will continue her Remeron and we'll try the Abilify 10 mg once a day again. Decline psychiatry I think some of her mood swings will stabilize it she does take the medication normal regular basis.

## 2013-11-10 NOTE — Assessment & Plan Note (Signed)
She is maintaining her weight currently she wants to be 140 pounds. I think 135 pounds would be a healthier weight for her. What her moods which tends to lose a lot of weight she does not eat on a regular basis. Hopefully since having her gallbladder out and have her gastritis cleared her appetite we'll to continue to improve

## 2013-11-10 NOTE — Patient Instructions (Signed)
Try the abilify in the morning 10mg   Continue current medications F/U 3 months

## 2013-11-10 NOTE — Progress Notes (Signed)
Patient ID: Robin Delgado, female   DOB: 07/20/1980, 33 y.o.   MRN: [id number]   Subjective:    Patient ID: Robin Pigeonynthia N Delgado, female    DOB: 04/29/1981, 33 y.o.   MRN: [id number]  Patient presents for 3 month F/U  Patient to follow chronic medical problems here shows a history of bipolar and depression which is currently untreated she also has anxiety disorder and insomnia. She has been taking her Remeron every other day and this does help with her sleep but her mood is up and down. She is decline psychiatry multiple times in the past. She did take one dose of Abilify states they gave her upset stomach therefore she stopped taking it this was a few weeks ago even a prescription was written many months ago. She started to get her appetite back from her previous gallbladder surgery he does have some diarrhea and upset stomach if she eats too much fatty and greasy foods. We have a lot of difficulties with compliance with her and her medications   Review Of Systems:  GEN- denies fatigue, fever, weight loss,weakness, recent illness HEENT- denies eye drainage, change in vision, nasal discharge, CVS- denies chest pain, palpitations RESP- denies SOB, cough, wheeze ABD- denies N/V, change in stools, abd pain GU- denies dysuria, hematuria, dribbling, incontinence MSK- denies joint pain, muscle aches, injury Neuro- denies headache, dizziness, syncope, seizure activity       Objective:    BP 120/62  Pulse 82  Temp(Src) 98.3 F (36.8 C) (Oral)  Resp 16  Ht 5' 2.5" (1.588 m)  Wt 130 lb (58.968 kg)  BMI 23.38 kg/m2  LMP 09/10/2013 GEN- NAD, alert and oriented x3 CVS- RRR, no murmur RESP-CTAB ABD-NABS,soft,NT,ND Psych- normal affect, smiling, laughing, not depressed or anxious, normal speech, normal thought process EXT- No edema Pulses- Radial 2+        Assessment & Plan:      Problem List Items Addressed This Visit   None      Note: This dictation was prepared with  Dragon dictation along with smaller phrase technology. Any transcriptional errors that result from this process are unintentional.

## 2013-11-25 ENCOUNTER — Ambulatory Visit: Payer: BC Managed Care – PPO | Admitting: Family Medicine

## 2013-12-03 ENCOUNTER — Telehealth: Payer: Self-pay | Admitting: Gastroenterology

## 2013-12-03 MED ORDER — OMEPRAZOLE 20 MG PO CPDR: 20.0000 mg | DELAYED_RELEASE_CAPSULE | Freq: Every day | ORAL | Status: DC

## 2013-12-03 MED ORDER — DICYCLOMINE HCL 10 MG PO CAPS: 10.0000 mg | ORAL_CAPSULE | Freq: Three times a day (TID) | ORAL | Status: DC

## 2013-12-03 NOTE — Telephone Encounter (Signed)
Pt called today saying that she isn't feeling any better since she had her procedure by SF and Dr Lovell Sheehan. She has a FU OV on 7/1 with [facility] and she wants to know what SF would recommend her. Please advise [phone]

## 2013-12-03 NOTE — Telephone Encounter (Signed)
I called pt. She said she had her gall bladder removed in 09/2013. She did feel some better for awhile, but for the last 5-6 days she has had a lot of nausea. She said everything that she tries to eat goes right through her.   She did say it has been hard for her to discipline herself to eat healthy, because she has always eaten greasy fast foods.  She said when she does eat something like pizza, she only eats a small amount.  I encouraged her to stop fried foods altogether.   She has appt on 01/06/2014 with Gerrit Halls, NP. She feels she needs to be seen earlier. Said she did weigh 140 plus pounds and has got down to 120.   Please advise!  ( Dr. Darrick Penna is out of the office until next Tues).  Routing to Gerrit Halls, NP.

## 2013-12-03 NOTE — Telephone Encounter (Signed)
She does not have Prilosec on her med list. She needs to take this daily indefinitely. I sent this to her pharmacy.    Sometimes after cholecystectomy, people may have loose stools. I sent Bentyl to take with meals and at bedtime. Contact us if no improvement.   Nira Retort, ANP-BC [facility]

## 2013-12-03 NOTE — Telephone Encounter (Signed)
Pt returned call and was informed.  

## 2013-12-03 NOTE — Telephone Encounter (Signed)
LMOM to call.

## 2014-01-06 ENCOUNTER — Ambulatory Visit: Payer: Self-pay | Admitting: Gastroenterology

## 2014-01-20 ENCOUNTER — Other Ambulatory Visit: Payer: Self-pay | Admitting: Family Medicine

## 2014-01-20 DIAGNOSIS — F439 Reaction to severe stress, unspecified: Secondary | ICD-10-CM

## 2014-01-20 DIAGNOSIS — F3130 Bipolar disorder, current episode depressed, mild or moderate severity, unspecified: Secondary | ICD-10-CM

## 2014-01-29 ENCOUNTER — Telehealth: Payer: Self-pay | Admitting: *Deleted

## 2014-01-29 NOTE — Telephone Encounter (Signed)
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 Robin Delgado also needs to be seen for her stressors. If she does not take them,I will need to call social services to if they can help in the home as there are no other options. 

## 2014-01-29 NOTE — Telephone Encounter (Signed)
Received call from [place] from [facility] stating that pt Robin Delgado wants to decline services for therapy at this time stating that does not want to be seen for therapy, Robin Delgado says she keeps coming up with excuse and does not want to talk about family issues at this time.

## 2014-02-01 NOTE — Telephone Encounter (Signed)
I spoke with patient mother Robin Delgado. 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 I decided that I will followup with the family in 4 weeks time after he has been through his surgery for the leg to see if therapy is clearly weak and get both him in my he should back into therapy Robin Delgado has declined therapy at this time but understands that the children need to have thebehaviors aswella sschoolabsences.Seethenotes 

## 2014-02-01 NOTE — Telephone Encounter (Signed)
Call placed to Reighan Beyl to make aware of MD advice.   Reports that she is going to get the children into counseling in [place] since it is closer to their schools.   Advised that MD is concerned about mental health of children and social services would need to be contacted if counceling services for children continued to be denied.   Lusine became belligerent and states that she has never abused children and they have always been to every doctor's visit for their health. States that she is now thinking of switching doctors for herself and her children because she doesn't like being threatened.   MD to be made aware.  

## 2014-02-01 NOTE — Telephone Encounter (Signed)
Call placed to patient. LMTRC.  

## 2014-02-10 ENCOUNTER — Ambulatory Visit (INDEPENDENT_AMBULATORY_CARE_PROVIDER_SITE_OTHER): Payer: BC Managed Care – PPO | Admitting: Family Medicine

## 2014-02-10 ENCOUNTER — Encounter: Payer: Self-pay | Admitting: Family Medicine

## 2014-02-10 VITALS — BP 132/76 | HR 72 | Temp 98.6°F | Resp 14 | Ht 64.0 in | Wt 141.0 lb

## 2014-02-10 DIAGNOSIS — D509 Iron deficiency anemia, unspecified: Secondary | ICD-10-CM

## 2014-02-10 DIAGNOSIS — D508 Other iron deficiency anemias: Secondary | ICD-10-CM

## 2014-02-10 DIAGNOSIS — F3130 Bipolar disorder, current episode depressed, mild or moderate severity, unspecified: Secondary | ICD-10-CM

## 2014-02-10 DIAGNOSIS — G4700 Insomnia, unspecified: Secondary | ICD-10-CM

## 2014-02-10 DIAGNOSIS — F411 Generalized anxiety disorder: Secondary | ICD-10-CM

## 2014-02-10 DIAGNOSIS — E876 Hypokalemia: Secondary | ICD-10-CM

## 2014-02-10 NOTE — Assessment & Plan Note (Signed)
Declines therapy or other meds Takes remeron occasionally

## 2014-02-10 NOTE — Assessment & Plan Note (Signed)
Takes remeron to help with sleep as well

## 2014-02-10 NOTE — Patient Instructions (Addendum)
Continue current medications We will call with lab results  F/U as needed

## 2014-02-10 NOTE — Progress Notes (Signed)
Patient ID: Robin Delgado, female   DOB: 02/11/1981, 33 y.o.   MRN: [id number]   Subjective:    Patient ID: Robin Pigeonynthia N Delgado, female    DOB: 11/28/1980, 33 y.o.   MRN: [id number]  Patient presents for 3 month F/U and Sugar test  Patient here to followup medications. She's actually stopped almost all her medications she occasionally takes her Remeron. She's not taken in her GI medications states her stomach is much better her appetite is good she has gained about 10 pounds and is very happy with this. 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  She was concerned about her blood sugar she states she's tired all the time she does also have history of iron deficiency anemia and she stopped taking her iron tablets.   Review Of Systems:  GEN- + fatigue, fever, weight loss,weakness, recent illness HEENT- denies eye drainage, change in vision, nasal discharge, CVS- denies chest pain, palpitations RESP- denies SOB, cough, wheeze ABD- denies N/V, change in stools, abd pain GU- denies dysuria, hematuria, dribbling, incontinence MSK- denies joint pain, muscle aches, injury Neuro- denies headache, dizziness, syncope, seizure activity       Objective:    BP 132/76  Pulse 72  Temp(Src) 98.6 F (37 C) (Oral)  Resp 14  Ht 5\' 4"  (1.626 m)  Wt 141 lb (63.957 kg)  BMI 24.19 kg/m2  LMP 01/23/2014 GEN- NAD, alert and oriented x3 HEENT- PERRL, EOMI, non injected sclera, pink conjunctiva, MMM, oropharynx clear Neck- Supple, no thyromegaly CVS- RRR, no murmur RESP-CTAB Psych- normal affect and mood Pulses- Radial 2+        Assessment & Plan:      Problem List Items Addressed This Visit   None    Visit Diagnoses   Hypokalemia    -  Primary    Relevant Orders       Basic metabolic panel    Other iron deficiency anemias        Relevant Orders       CBC with Differential       Note: This dictation was prepared with Dragon dictation along with smaller phrase technology. Any transcriptional errors that result from this process are unintentional.

## 2014-02-10 NOTE — Assessment & Plan Note (Signed)
Recheck CBC, discussed importance of meds

## 2014-02-11 LAB — CBC WITH DIFFERENTIAL/PLATELET
Basophils Absolute: 0 10*3/uL (ref 0.0–0.1)
Basophils Relative: 0 % (ref 0–1)
Eosinophils Absolute: 0.2 10*3/uL (ref 0.0–0.7)
Eosinophils Relative: 3 % (ref 0–5)
HCT: 34.8 % — ABNORMAL LOW (ref 36.0–46.0)
Hemoglobin: 11.1 g/dL — ABNORMAL LOW (ref 12.0–15.0)
Lymphocytes Relative: 38 % (ref 12–46)
Lymphs Abs: 2.9 10*3/uL (ref 0.7–4.0)
MCH: 22.7 pg — ABNORMAL LOW (ref 26.0–34.0)
MCHC: 31.9 g/dL (ref 30.0–36.0)
MCV: 71.3 fL — ABNORMAL LOW (ref 78.0–100.0)
Monocytes Absolute: 0.5 10*3/uL (ref 0.1–1.0)
Monocytes Relative: 7 % (ref 3–12)
Neutro Abs: 4 10*3/uL (ref 1.7–7.7)
Neutrophils Relative %: 52 % (ref 43–77)
Platelets: 332 10*3/uL (ref 150–400)
RBC: 4.88 MIL/uL (ref 3.87–5.11)
RDW: 16.4 % — ABNORMAL HIGH (ref 11.5–15.5)
WBC: 7.7 10*3/uL (ref 4.0–10.5)

## 2014-02-11 LAB — BASIC METABOLIC PANEL
BUN: 8 mg/dL (ref 6–23)
CO2: 25 mEq/L (ref 19–32)
Calcium: 9 mg/dL (ref 8.4–10.5)
Chloride: 107 mEq/L (ref 96–112)
Creat: 0.62 mg/dL (ref 0.50–1.10)
Glucose, Bld: 83 mg/dL (ref 70–99)
Potassium: 4 mEq/L (ref 3.5–5.3)
Sodium: 138 mEq/L (ref 135–145)

## 2014-02-13 ENCOUNTER — Other Ambulatory Visit: Payer: Self-pay | Admitting: *Deleted

## 2014-02-13 MED ORDER — FERROUS SULFATE 325 (65 FE) MG PO TABS: 325.0000 mg | ORAL_TABLET | Freq: Two times a day (BID) | ORAL | Status: DC

## 2014-02-16 ENCOUNTER — Telehealth: Payer: Self-pay | Admitting: Family Medicine

## 2014-02-16 NOTE — Telephone Encounter (Signed)
[phone]   PT is calling in regards to the referral that was going to be sent for therapy at [facility] she states that they are full and her and her children will not be able to go there and would like to be referred some where else

## 2014-02-22 ENCOUNTER — Telehealth: Payer: Self-pay | Admitting: Family Medicine

## 2014-02-22 MED ORDER — FERROUS SULFATE 75 (15 FE) MG/ML PO SOLN: 75.0000 mg | Freq: Every day | ORAL | Status: DC

## 2014-02-22 NOTE — Telephone Encounter (Signed)
I sent it as a prescription but it most likely will not be covered and she will need to get over the counter dose, pharmacy can help her with this, it is best if she just take the tablet

## 2014-02-22 NOTE — Telephone Encounter (Signed)
MD please advise

## 2014-02-22 NOTE — Telephone Encounter (Signed)
There is no shot that she needs,she can cut the pills in half

## 2014-02-22 NOTE — Telephone Encounter (Signed)
Patient states that she cannot take the large pills and that she has tried to cut them in half.   Reports that she gags and vomits when she tries to take the meds.   MD to be made aware.

## 2014-02-22 NOTE — Telephone Encounter (Signed)
Call placed to patient and patient made aware.  

## 2014-02-22 NOTE — Telephone Encounter (Signed)
Patient requested liquid iron supplement.   Please advise on dosage.

## 2014-02-22 NOTE — Telephone Encounter (Signed)
She can try to get the liquid iron for children, but she does not need infusions, they also have a gummie she can try

## 2014-02-22 NOTE — Telephone Encounter (Signed)
[phone]  Pt is calling because the pills that were given for her to take because her Iron is low is to big for her to swallow and she would like to have a shot instead

## 2014-02-23 NOTE — Telephone Encounter (Signed)
Call placed to patient and patient made aware.  

## 2014-03-10 ENCOUNTER — Other Ambulatory Visit: Payer: Self-pay | Admitting: Family Medicine

## 2014-03-10 MED ORDER — MIRTAZAPINE 15 MG PO TABS: 15.0000 mg | ORAL_TABLET | Freq: Every day | ORAL | Status: DC

## 2014-03-19 ENCOUNTER — Ambulatory Visit: Payer: BC Managed Care – PPO | Admitting: Family Medicine

## 2014-04-14 ENCOUNTER — Encounter: Payer: Self-pay | Admitting: Family Medicine

## 2014-04-14 ENCOUNTER — Ambulatory Visit (INDEPENDENT_AMBULATORY_CARE_PROVIDER_SITE_OTHER): Payer: BC Managed Care – PPO | Admitting: Family Medicine

## 2014-04-14 VITALS — BP 128/76 | HR 78 | Temp 98.1°F | Resp 16 | Ht 63.0 in | Wt 144.0 lb

## 2014-04-14 DIAGNOSIS — F3130 Bipolar disorder, current episode depressed, mild or moderate severity, unspecified: Secondary | ICD-10-CM

## 2014-04-14 DIAGNOSIS — A599 Trichomoniasis, unspecified: Secondary | ICD-10-CM

## 2014-04-14 MED ORDER — METRONIDAZOLE 250 MG PO TABS: 250.0000 mg | ORAL_TABLET | Freq: Three times a day (TID) | ORAL | Status: DC

## 2014-04-14 MED ORDER — ARIPIPRAZOLE 10 MG PO TABS: 10.0000 mg | ORAL_TABLET | Freq: Every day | ORAL | Status: DC

## 2014-04-14 NOTE — Assessment & Plan Note (Signed)
Start Abilify 10 mg once a day and will continue Remeron at the current dose

## 2014-04-14 NOTE — Patient Instructions (Addendum)
Take the new antibiotic three times a day  F/U 6 weeks

## 2014-04-14 NOTE — Progress Notes (Signed)
Patient ID: Robin Delgado, female   DOB: 04/12/1981, 33 y.o.   MRN: [id number]   Subjective:    Patient ID: Robin Delgado, female    DOB: 10/23/1980, 33 y.o.   MRN: [id number]  Patient presents for Personal Issues  patient here for medications. She was seen by her GYN for her routine physical examination she was positive for trichomonas she was given Flagyl however she vomited after taking a couple the pills she called her GYN and they called in another 2000 mg of Flagyl but then called in Tinazadole  and all she states that the pills are too large and that she always vomits due to the metallic taste in the size. I did ask her she cut them in half she says it is still occurs but she can take the small round tablets which are the 250 mg. No vaginal discharge, no pelvic pain  She is under a lot of stress at home we have dealt with this back-and-forth regarding her bipolar depression and her anxiety  and she is refused to take other medications with exception of Remeron because it does help her sleep. She does admit that she's been eating all day long to help her stress because of her significant other. And now she has an STD because of her significant other. She agrees that she will like to try something different for her mood.    Review Of Systems:  GEN- denies fatigue, fever, weight loss,weakness, recent illness HEENT- denies eye drainage, change in vision, nasal discharge, CVS- denies chest pain, palpitations RESP- denies SOB, cough, wheeze ABD- denies N/V, change in stools, abd pain GU- denies dysuria, hematuria, dribbling, incontinence MSK- denies joint pain, muscle aches, injury Neuro- denies headache, dizziness, syncope, seizure activity       Objective:    BP 128/76  Pulse 78  Temp(Src) 98.1 F (36.7 C) (Oral)  Resp 16  Ht 5\' 3"  (1.6 m)  Wt 144 lb (65.318 kg)  BMI 25.51 kg/m2 GEN- NAD, alert and oriented x3 Psych- normal affect and mood, no SI, well  groomed       Assessment & Plan:      Problem List Items Addressed This Visit   None      Note: This dictation was prepared with Dragon dictation along with smaller phrase technology. Any transcriptional errors that result from this process are unintentional.

## 2014-04-14 NOTE — Assessment & Plan Note (Signed)
Will give flagyl 250mg  1 po TID x 7 days, as this is a smaller pill

## 2014-04-30 ENCOUNTER — Encounter: Payer: Self-pay | Admitting: Family Medicine

## 2014-04-30 ENCOUNTER — Other Ambulatory Visit: Payer: Self-pay | Admitting: Family Medicine

## 2014-04-30 ENCOUNTER — Ambulatory Visit (INDEPENDENT_AMBULATORY_CARE_PROVIDER_SITE_OTHER): Payer: BC Managed Care – PPO | Admitting: Family Medicine

## 2014-04-30 VITALS — BP 138/70 | HR 68 | Temp 97.5°F | Resp 12 | Ht 63.0 in | Wt 142.0 lb

## 2014-04-30 DIAGNOSIS — N760 Acute vaginitis: Secondary | ICD-10-CM

## 2014-04-30 DIAGNOSIS — N3090 Cystitis, unspecified without hematuria: Secondary | ICD-10-CM

## 2014-04-30 DIAGNOSIS — N920 Excessive and frequent menstruation with regular cycle: Secondary | ICD-10-CM

## 2014-04-30 DIAGNOSIS — N898 Other specified noninflammatory disorders of vagina: Secondary | ICD-10-CM

## 2014-04-30 LAB — URINALYSIS, ROUTINE W REFLEX MICROSCOPIC
Bilirubin Urine: NEGATIVE
Glucose, UA: NEGATIVE mg/dL
Hgb urine dipstick: NEGATIVE
Ketones, ur: NEGATIVE mg/dL
Nitrite: POSITIVE — AB
Protein, ur: NEGATIVE mg/dL
Specific Gravity, Urine: 1.015 (ref 1.005–1.030)
Urobilinogen, UA: 0.2 mg/dL (ref 0.0–1.0)
pH: 6 (ref 5.0–8.0)

## 2014-04-30 LAB — URINALYSIS, MICROSCOPIC ONLY
Casts: NONE SEEN
Crystals: NONE SEEN

## 2014-04-30 LAB — PREGNANCY, URINE: Preg Test, Ur: NEGATIVE

## 2014-04-30 LAB — WET PREP FOR TRICH, YEAST, CLUE
Trich, Wet Prep: NONE SEEN
Yeast Wet Prep HPF POC: NONE SEEN

## 2014-04-30 MED ORDER — METRONIDAZOLE 250 MG PO TABS: 250.0000 mg | ORAL_TABLET | Freq: Three times a day (TID) | ORAL | Status: DC

## 2014-04-30 NOTE — Patient Instructions (Signed)
We will call with lab results  F/U as needed 

## 2014-04-30 NOTE — Progress Notes (Signed)
Patient ID: Robin PigeonCynthia N Delgado, female   DOB: 03/08/1981, 33 y.o.   MRN: [id number]   Subjective:    Patient ID: Robin Pigeonynthia N Delgado, female    DOB: 09/09/1980, 33 y.o.   MRN: [id number]  Patient presents for Vaginal Spotting  patient here with vaginal spotting for the past couple days. She was treated for trichomonas about 3 weeks ago however she still has not completed antibiotics he says she does not like to take on empty stomach. Of note she is also not taking her Remeron or the Abilify. She's had some mild lower abdominal discomfort feels like cramps for her menstrual cycle however her menses is not supposed to start for another week. She is using a birth control patch.    Review Of Systems:  GEN- denies fatigue, fever, weight loss,weakness, recent illness HEENT- denies eye drainage, change in vision, nasal discharge, CVS- denies chest pain, palpitations RESP- denies SOB, cough, wheeze ABD- denies N/V, change in stools, +abd pain GU- denies dysuria, hematuria, dribbling, incontinence MSK- denies joint pain, muscle aches, injury Neuro- denies headache, dizziness, syncope, seizure activity       Objective:    BP 138/70  Pulse 68  Temp(Src) 97.5 F (36.4 C) (Oral)  Resp 12  Ht 5\' 3"  (1.6 m)  Wt 142 lb (64.411 kg)  BMI 25.16 kg/m2 GEN- NAD, alert and oriented x3 ABD-NABS,soft,NT,ND, no CVA tenderness GU- normal external genitalia, vaginal mucosa pink and moist, cervix visualized no growth, + blood form os, + thich white/yellow discharge on cervix, canal and vaginal walls, no CMT, no ovarian masses, mild TTP over left ovary, uterus normal size EXT- No edema Pulses- Radial 2+        Assessment & Plan:      Problem List Items Addressed This Visit   None    Visit Diagnoses   Vaginitis and vulvovaginitis    -  Primary    Wet prep shows a lot of bacteria, BV, GC pending,she is not compliant with antibiotics, will extend 5 more days of flagyl, will also send urine  culture, based on UA , she is not having active UTI symptoms, but has signficant bacteria in urine.     Relevant Orders       GC/chlamydia probe amp, genital       WET PREP FOR TRICH, YEAST, CLUE (Completed)    Spotting        Upreg neg, treat vaginitis per above    Relevant Orders       Urinalysis, Routine w reflex microscopic (Completed)       Pregnancy, urine (Completed)       Note: This dictation was prepared with Dragon dictation along with smaller phrase technology. Any transcriptional errors that result from this process are unintentional.

## 2014-05-01 LAB — GC/CHLAMYDIA PROBE AMP
CT Probe RNA: NEGATIVE
GC Probe RNA: NEGATIVE

## 2014-05-02 LAB — URINE CULTURE: Colony Count: >=100000

## 2014-05-03 ENCOUNTER — Other Ambulatory Visit: Payer: Self-pay | Admitting: *Deleted

## 2014-05-03 MED ORDER — CIPROFLOXACIN HCL 500 MG PO TABS: 500.0000 mg | ORAL_TABLET | Freq: Two times a day (BID) | ORAL | Status: DC

## 2014-05-26 ENCOUNTER — Ambulatory Visit: Payer: BC Managed Care – PPO | Admitting: Family Medicine

## 2014-06-15 ENCOUNTER — Ambulatory Visit: Payer: BC Managed Care – PPO | Admitting: Family Medicine

## 2014-06-16 ENCOUNTER — Ambulatory Visit: Payer: BC Managed Care – PPO | Admitting: Family Medicine

## 2014-08-05 ENCOUNTER — Ambulatory Visit: Payer: Self-pay | Admitting: Physician Assistant

## 2014-08-26 ENCOUNTER — Ambulatory Visit: Payer: 59 | Admitting: Physician Assistant

## 2014-08-27 ENCOUNTER — Encounter: Payer: Self-pay | Admitting: Family Medicine

## 2014-08-27 ENCOUNTER — Ambulatory Visit (INDEPENDENT_AMBULATORY_CARE_PROVIDER_SITE_OTHER): Payer: 59 | Admitting: Family Medicine

## 2014-08-27 VITALS — BP 126/70 | HR 76 | Temp 98.2°F | Resp 18 | Wt 150.0 lb

## 2014-08-27 DIAGNOSIS — N3001 Acute cystitis with hematuria: Secondary | ICD-10-CM

## 2014-08-27 DIAGNOSIS — R319 Hematuria, unspecified: Secondary | ICD-10-CM | POA: Diagnosis not present

## 2014-08-27 LAB — URINALYSIS, ROUTINE W REFLEX MICROSCOPIC
Bilirubin Urine: NEGATIVE
Glucose, UA: NEGATIVE mg/dL
Ketones, ur: NEGATIVE mg/dL
Nitrite: POSITIVE — AB
Protein, ur: 30 mg/dL — AB
Specific Gravity, Urine: 1.015 (ref 1.005–1.030)
Urobilinogen, UA: 0.2 mg/dL (ref 0.0–1.0)
pH: 6.5 (ref 5.0–8.0)

## 2014-08-27 LAB — URINALYSIS, MICROSCOPIC ONLY
Casts: NONE SEEN
Crystals: NONE SEEN

## 2014-08-27 MED ORDER — CIPROFLOXACIN HCL 500 MG PO TABS: 500.0000 mg | ORAL_TABLET | Freq: Two times a day (BID) | ORAL | Status: DC

## 2014-08-27 NOTE — Progress Notes (Signed)
   Subjective:    Patient ID: Robin Delgado, female    DOB: 07/15/1980, 34 y.o.   MRN: [id number]  HPI Patient has had 2-3 days of urinary frequency, foul smelling urine, trace hematuria, and mild dysuria.  UA is significant for LE, nitrates, and is suggestive of UTI.  SHe denies, fever, chills, or LBP.  She denies abd pain. Past Medical History  Diagnosis Date  . Depression   . STD (sexually transmitted disease)     Trichomonas,   . CIN II (cervical intraepithelial neoplasia II) 2008    Colpo/Leep   . Anemia    Past Surgical History  Procedure Laterality Date  . Dilation and curettage of uterus N/A 07/09/1998    and 02/07/2003  . Esophagogastroduodenoscopy N/A 09/15/2013    Procedure: ESOPHAGOGASTRODUODENOSCOPY (EGD);  Surgeon: West BaliSandi L Fields, MD;  Location: [facility];  Service: Endoscopy;  Laterality: N/A;  11:30-moved to 1030 [place] notified pt  . Cholecystectomy N/A 09/25/2013    Procedure: LAPAROSCOPIC CHOLECYSTECTOMY;  Surgeon: Dalia HeadingMark A Jenkins, MD;  Location: [facility];  Service: General;  Laterality: N/A;   Current Outpatient Prescriptions on File Prior to Visit  Medication Sig Dispense Refill  . benzoyl peroxide 5 % gel Apply topically at bedtime. 60 g 3  . mirtazapine (REMERON) 15 MG tablet Take 1 tablet (15 mg total) by mouth at bedtime. 30 tablet 3  . norelgestromin-ethinyl estradiol Burr Medico(XULANE) 150-35 MCG/24HR transdermal patch Place 1 patch onto the skin once a week.    . ARIPiprazole (ABILIFY) 10 MG tablet Take 1 tablet (10 mg total) by mouth daily. (Patient not taking: Reported on 08/27/2014) 30 tablet 3  . ferrous sulfate (FER-IN-SOL) 75 (15 FE) MG/ML SOLN Take 5 mLs (75 mg of iron total) by mouth daily. (Patient not taking: Reported on 08/27/2014) 150 mL 6   No current facility-administered medications on file prior to visit.   Allergies  Allergen Reactions  . Macrobid [Nitrofurantoin Macrocrystal] Itching   History   Social History  . Marital Status: Single    Spouse Name: N/A  . Number of Children: N/A  . 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 Smoking status: Never Smoker   . Smokeless tobacco: Never Used  . Alcohol Use: No  . Drug Use: No  . Sexual Activity: Yes    Birth Control/ Protection: Patch   Other Topics Concern  . Not on file   Social History Narrative      Review of Systems  All other systems reviewed and are negative.      Objective:   Physical Exam  Cardiovascular: Normal rate, regular rhythm and normal heart sounds.   Pulmonary/Chest: Effort normal and breath sounds normal.  Abdominal: Soft. Bowel sounds are normal. She exhibits no distension. There is no tenderness. There is no rebound.  Vitals reviewed.         Assessment & Plan:  Blood in urine - Plan: Urinalysis, Routine w reflex microscopic  Acute cystitis with hematuria - Plan: ciprofloxacin (CIPRO) 500 MG tablet, Urine culture, DISCONTINUED: ciprofloxacin (CIPRO) 500 MG tablet  Appears to have UTI.  Urine CX sent.  Begin cipro 500 bid for 3 days and recheck next week if no better.

## 2014-08-29 LAB — URINE CULTURE: Colony Count: >=100000

## 2014-09-02 ENCOUNTER — Encounter: Payer: Self-pay | Admitting: Family Medicine

## 2014-09-21 ENCOUNTER — Other Ambulatory Visit: Payer: Self-pay | Admitting: Family Medicine

## 2014-09-21 MED ORDER — MIRTAZAPINE 15 MG PO TABS: 15.0000 mg | ORAL_TABLET | Freq: Every day | ORAL | Status: DC

## 2014-09-28 ENCOUNTER — Telehealth: Payer: Self-pay | Admitting: Family Medicine

## 2014-09-28 MED ORDER — NORELGESTROMIN-ETH ESTRADIOL 150-35 MCG/24HR TD PTWK: 1.0000 | MEDICATED_PATCH | TRANSDERMAL | Status: DC

## 2014-09-28 NOTE — Telephone Encounter (Signed)
Patient calling to see if dr Jeanice Limdurham can call in her birth control patch  To :    [facility] [place]  [phone] if any questions

## 2014-09-28 NOTE — Telephone Encounter (Signed)
Prescription sent to pharmacy.

## 2014-10-11 ENCOUNTER — Ambulatory Visit (INDEPENDENT_AMBULATORY_CARE_PROVIDER_SITE_OTHER): Payer: 59 | Admitting: Family Medicine

## 2014-10-11 ENCOUNTER — Encounter: Payer: Self-pay | Admitting: Family Medicine

## 2014-10-11 VITALS — BP 120/68 | HR 76 | Temp 97.7°F | Resp 18 | Ht 63.0 in | Wt 151.0 lb

## 2014-10-11 DIAGNOSIS — R21 Rash and other nonspecific skin eruption: Secondary | ICD-10-CM

## 2014-10-11 DIAGNOSIS — F3130 Bipolar disorder, current episode depressed, mild or moderate severity, unspecified: Secondary | ICD-10-CM

## 2014-10-11 DIAGNOSIS — F411 Generalized anxiety disorder: Secondary | ICD-10-CM | POA: Diagnosis not present

## 2014-10-11 DIAGNOSIS — Z0000 Encounter for general adult medical examination without abnormal findings: Secondary | ICD-10-CM | POA: Diagnosis not present

## 2014-10-11 DIAGNOSIS — Z113 Encounter for screening for infections with a predominantly sexual mode of transmission: Secondary | ICD-10-CM | POA: Diagnosis not present

## 2014-10-11 DIAGNOSIS — Z124 Encounter for screening for malignant neoplasm of cervix: Secondary | ICD-10-CM | POA: Diagnosis not present

## 2014-10-11 LAB — WET PREP FOR TRICH, YEAST, CLUE
Clue Cells Wet Prep HPF POC: NONE SEEN
Trich, Wet Prep: NONE SEEN
WBC, Wet Prep HPF POC: NONE SEEN
Yeast Wet Prep HPF POC: NONE SEEN

## 2014-10-11 MED ORDER — CLOTRIMAZOLE-BETAMETHASONE 1-0.05 % EX CREA: 1.0000 "application " | TOPICAL_CREAM | Freq: Two times a day (BID) | CUTANEOUS | Status: DC

## 2014-10-11 NOTE — Progress Notes (Signed)
Patient ID: Robin Delgado, female   DOB: 10/24/1980, 34 y.o.   MRN: [id number]   Subjective:    Patient ID: Robin Pigeonynthia N Delgado, female    DOB: 08/19/1980, 34 y.o.   MRN: [id number]  Patient presents for CPE with PAP  Last PAP Smear 2 years ago, she was treated for STI in December 2015, states not sexually active at this time. Desires BTL, currently has contraceptive patch.  + vaginal discharge, also noticed redness and itching where her thighs rub comes and goes especially when she is hot.   Bipolar/GAD- she would like to go back to psychiatry, states she gets very anxious and still feels depressed, she is willing to try another medication but wants to talk with them first. She is taking the remeron, but only takes a few times a week as she is watching her weight, since she has been on the meds on and off she has gained from 110lbs to current 150lbs. She feels like she sleep well with the meds and it helps her depression some    Review Of Systems:  GEN- denies fatigue, fever, weight loss,weakness, recent illness HEENT- denies eye drainage, change in vision, nasal discharge, CVS- denies chest pain, palpitations RESP- denies SOB, cough, wheeze ABD- denies N/V, change in stools, abd pain GU- denies dysuria, hematuria, dribbling, incontinence MSK- denies joint pain, muscle aches, injury Neuro- denies headache, dizziness, syncope, seizure activity       Objective:    BP 120/68 mmHg  Pulse 76  Temp(Src) 97.7 F (36.5 C) (Oral)  Resp 18  Ht 5\' 3"  (1.6 m)  Wt 151 lb (68.493 kg)  BMI 26.76 kg/m2  LMP 10/01/2014 (Approximate) GEN- NAD, alert and oriented x3 HEENT- PERRL, EOMI, non injected sclera, pink conjunctiva, MMM, oropharynx clear Neck- Supple, no thyromegaly CVS- RRR, no murmur RESP-CTAB Breast- normal symmetry, no nipple inversion,no nipple drainage, no nodules or lumps felt Nodes- no axillary nodes ABD-NABS,soft,NT,ND GU- normal external genitalia, vaginal mucosa  pink and moist, cervix visualized no growth, stenosed os, + blood form os, + white discharge, no CMT, no ovarian masses, uterus normal size Psych- normal affect and mood EXT- No edema Pulses- Radial, DP- 2+        Assessment & Plan:      Problem List Items Addressed This Visit      Unprioritized   Generalized anxiety disorder (Chronic)   Relevant Orders   Ambulatory referral to Psychiatry   Bipolar I disorder, most recent episode depressed    She has diagnosis of Bipolar I and anxiety, has declined meds in the past, was given Tegretol, celexa, abilify, she only takes remeron every other day as it does help her sleep and she gained weight with it Refer to new psychiatrist, she would like therapy as well. I have advised her many times it is not the best medication especially with the weight gain but it does help her insomnia and her mood some      Relevant Orders   Ambulatory referral to Psychiatry    Other Visit Diagnoses    Annual physical exam    -  Primary    CPE done, PAP Smear done- STD screening, TDAP UTD, non fasting labs, refer to GYN for BTL she has appt scheduled    Relevant Orders    CBC with Differential/Platelet    Comprehensive metabolic panel    TSH    Lipid panel    Screen for STD (sexually transmitted disease)  Relevant Orders    WET PREP FOR TRICH, YEAST, CLUE (Completed)    GC/Chlamydia Probe Amp    HIV antibody (with reflex)    Pap smear for cervical cancer screening        Relevant Orders    PAP, ThinPrep ASCUS Rflx HPV Rflx Type    Rash and nonspecific skin eruption        not classic of intertrigo but in same zone, no ulcerations seen, will give lotrisone       Note: This dictation was prepared with Dragon dictation along with smaller phrase technology. Any transcriptional errors that result from this process are unintentional.

## 2014-10-11 NOTE — Patient Instructions (Signed)
Referral to pschiatry Referral to Dr. Emelda FearFerguson to discuss tubal ligation Continue current meds We will call with lab results F/U 6 MONTHS

## 2014-10-11 NOTE — Assessment & Plan Note (Signed)
She has diagnosis of Bipolar I and anxiety, has declined meds in the past, was given Tegretol, celexa, abilify, she only takes remeron every other day as it does help her sleep and she gained weight with it Refer to new psychiatrist, she would like therapy as well

## 2014-10-12 LAB — CBC WITH DIFFERENTIAL/PLATELET
Basophils Absolute: 0 10*3/uL (ref 0.0–0.1)
Basophils Relative: 0 % (ref 0–1)
Eosinophils Absolute: 0.2 10*3/uL (ref 0.0–0.7)
Eosinophils Relative: 2 % (ref 0–5)
HCT: 35.4 % — ABNORMAL LOW (ref 36.0–46.0)
Hemoglobin: 11.3 g/dL — ABNORMAL LOW (ref 12.0–15.0)
Lymphocytes Relative: 30 % (ref 12–46)
Lymphs Abs: 2.9 10*3/uL (ref 0.7–4.0)
MCH: 22.9 pg — ABNORMAL LOW (ref 26.0–34.0)
MCHC: 31.9 g/dL (ref 30.0–36.0)
MCV: 71.8 fL — ABNORMAL LOW (ref 78.0–100.0)
MPV: 9 fL (ref 8.6–12.4)
Monocytes Absolute: 0.6 10*3/uL (ref 0.1–1.0)
Monocytes Relative: 6 % (ref 3–12)
Neutro Abs: 6 10*3/uL (ref 1.7–7.7)
Neutrophils Relative %: 62 % (ref 43–77)
Platelets: 366 10*3/uL (ref 150–400)
RBC: 4.93 MIL/uL (ref 3.87–5.11)
RDW: 15.6 % — ABNORMAL HIGH (ref 11.5–15.5)
WBC: 9.6 10*3/uL (ref 4.0–10.5)

## 2014-10-12 LAB — COMPREHENSIVE METABOLIC PANEL
ALT: 12 U/L (ref 0–35)
AST: 15 U/L (ref 0–37)
Albumin: 3.9 g/dL (ref 3.5–5.2)
Alkaline Phosphatase: 106 U/L (ref 39–117)
BUN: 10 mg/dL (ref 6–23)
CO2: 28 mEq/L (ref 19–32)
Calcium: 9.2 mg/dL (ref 8.4–10.5)
Chloride: 104 mEq/L (ref 96–112)
Creat: 0.8 mg/dL (ref 0.50–1.10)
Glucose, Bld: 91 mg/dL (ref 70–99)
Potassium: 4 mEq/L (ref 3.5–5.3)
Sodium: 138 mEq/L (ref 135–145)
Total Bilirubin: 0.3 mg/dL (ref 0.2–1.2)
Total Protein: 7.2 g/dL (ref 6.0–8.3)

## 2014-10-12 LAB — LIPID PANEL
Cholesterol: 159 mg/dL (ref 0–200)
HDL: 62 mg/dL (ref >=46)
LDL Cholesterol: 81 mg/dL (ref 0–99)
Total CHOL/HDL Ratio: 2.6 Ratio
Triglycerides: 81 mg/dL (ref <150)
VLDL: 16 mg/dL (ref 0–40)

## 2014-10-12 LAB — PAP THINPREP ASCUS RFLX HPV RFLX TYPE

## 2014-10-12 LAB — HIV ANTIBODY (ROUTINE TESTING W REFLEX): HIV 1&2 Ab, 4th Generation: NONREACTIVE

## 2014-10-12 LAB — TSH: TSH: 1.548 u[IU]/mL (ref 0.350–4.500)

## 2014-10-13 ENCOUNTER — Telehealth: Payer: Self-pay | Admitting: *Deleted

## 2014-10-13 LAB — GC/CHLAMYDIA PROBE AMP
CT Probe RNA: NEGATIVE
GC Probe RNA: NEGATIVE

## 2014-10-13 NOTE — Telephone Encounter (Signed)
I do not recommend any detox, they cause a lot of nausea and diarrhea To detox healthy way- eat a lot of fiber, fruits and veggies, more water

## 2014-10-13 NOTE — Telephone Encounter (Signed)
Call placed to patient with results of labs.   States that she also had question about stomach detox for flatter stomach. Requested MD to advise.

## 2014-10-13 NOTE — Telephone Encounter (Signed)
Call placed to patient and patient made aware.  

## 2014-10-14 ENCOUNTER — Ambulatory Visit (INDEPENDENT_AMBULATORY_CARE_PROVIDER_SITE_OTHER): Payer: 59 | Admitting: Obstetrics & Gynecology

## 2014-10-14 ENCOUNTER — Encounter: Payer: Self-pay | Admitting: Obstetrics & Gynecology

## 2014-10-14 VITALS — BP 120/70 | HR 72 | Ht 61.0 in | Wt 154.0 lb

## 2014-10-14 DIAGNOSIS — Z3009 Encounter for other general counseling and advice on contraception: Secondary | ICD-10-CM | POA: Diagnosis not present

## 2014-10-14 NOTE — Progress Notes (Signed)
Patient ID: Robin Delgado, female   DOB: 11/01/1980, 34 y.o.   MRN: [id number] Preoperative History and Physical  Robin Delgado is a 34 y.o. No obstetric history on file. with Patient's last menstrual period was 10/01/2014 (approximate). admitted for a laparoscopic bilateral salpingectomy for sterilization.    PMH:    Past Medical History  Diagnosis Date  . Depression   . STD (sexually transmitted disease)     Trichomonas,   . CIN II (cervical intraepithelial neoplasia II) 2008    Colpo/Leep   . Anemia     PSH:     Past Surgical History  Procedure Laterality Date  . Dilation and curettage of uterus N/A 07/09/1998    and 02/07/2003  . Esophagogastroduodenoscopy N/A 09/15/2013    Procedure: ESOPHAGOGASTRODUODENOSCOPY (EGD);  Surgeon: Danie Binder, MD;  Location: [facility];  Service: Endoscopy;  Laterality: N/A;  11:30-moved to [place] notified pt  . Cholecystectomy N/A 09/25/2013    Procedure: LAPAROSCOPIC CHOLECYSTECTOMY;  Surgeon: Jamesetta So, MD;  Location: [facility];  Service: General;  Laterality: N/A;    POb/GynH:      OB History    No data available      SH:   History  Substance Use Topics  . Smoking status: Never Smoker   . Smokeless tobacco: Never Used  . Alcohol Use: No    FH:    Family History  Problem Relation Age of Onset  . Hypertension Mother   . Cancer Father     stomach   . Hypertension Brother   . Alcohol abuse Brother   . ADD / ADHD Son   . Anxiety disorder Brother   . Depression Brother   . Drug abuse Neg Hx   . Bipolar disorder Neg Hx   . Dementia Neg Hx   . OCD Neg Hx   . Paranoid behavior Neg Hx   . Schizophrenia Neg Hx   . Seizures Neg Hx   . Sexual abuse Neg Hx   . Physical abuse Neg Hx   . Diabetes Maternal Grandmother   . Colon cancer Neg Hx      Allergies:  Allergies  Allergen Reactions  . Macrobid [Nitrofurantoin Macrocrystal] Itching    Medications:       Current outpatient prescriptions:  .   mirtazapine (REMERON) 15 MG tablet, Take 1 tablet (15 mg total) by mouth at bedtime., Disp: 30 tablet, Rfl: 3 .  norelgestromin-ethinyl estradiol (XULANE) 150-35 MCG/24HR transdermal patch, Place 1 patch onto the skin once a week., Disp: 3 patch, Rfl: 6 .  clotrimazole-betamethasone (LOTRISONE) cream, Apply 1 application topically 2 (two) times daily. (Patient not taking: Reported on 10/14/2014), Disp: 30 g, Rfl: 0  Review of Systems:   Review of Systems  Constitutional: Negative for fever, chills, weight loss, malaise/fatigue and diaphoresis.  HENT: Negative for hearing loss, ear pain, nosebleeds, congestion, sore throat, neck pain, tinnitus and ear discharge.   Eyes: Negative for blurred vision, double vision, photophobia, pain, discharge and redness.  Respiratory: Negative for cough, hemoptysis, sputum production, shortness of breath, wheezing and stridor.   Cardiovascular: Negative for chest pain, palpitations, orthopnea, claudication, leg swelling and PND.  Gastrointestinal: Positive for abdominal pain. Negative for heartburn, nausea, vomiting, diarrhea, constipation, blood in stool and melena.  Genitourinary: Negative for dysuria, urgency, frequency, hematuria and flank pain.  Musculoskeletal: Negative for myalgias, back pain, joint pain and falls.  Skin: Negative for itching and rash.  Neurological: Negative for dizziness, tingling,  tremors, sensory change, speech change, focal weakness, seizures, loss of consciousness, weakness and headaches.  Endo/Heme/Allergies: Negative for environmental allergies and polydipsia. Does not bruise/bleed easily.  Psychiatric/Behavioral: Negative for depression, suicidal ideas, hallucinations, memory loss and substance abuse. The patient is not nervous/anxious and does not have insomnia.      PHYSICAL EXAM:  Blood pressure 120/70, pulse 72, height _0  (1.549 m), weight 154 lb (69.854 kg), last menstrual period 10/01/2014.    Vitals  reviewed. Constitutional: She is oriented to person, place, and time. She appears well-developed and well-nourished.  HENT:  Head: Normocephalic and atraumatic.  Right Ear: External ear normal.  Left Ear: External ear normal.  Nose: Nose normal.  Mouth/Throat: Oropharynx is clear and moist.  Eyes: Conjunctivae and EOM are normal. Pupils are equal, round, and reactive to light. Right eye exhibits no discharge. Left eye exhibits no discharge. No scleral icterus.  Neck: Normal range of motion. Neck supple. No tracheal deviation present. No thyromegaly present.  Cardiovascular: Normal rate, regular rhythm, normal heart sounds and intact distal pulses.  Exam reveals no gallop and no friction rub.   No murmur heard. Respiratory: Effort normal and breath sounds normal. No respiratory distress. She has no wheezes. She has no rales. She exhibits no tenderness.  GI: Soft. Bowel sounds are normal. She exhibits no distension and no mass. There is tenderness. There is no rebound and no guarding.  Genitourinary:       Vulva is normal without lesions Vagina is pink moist without discharge Cervix normal in appearance and pap is normal Uterus is normal size, contour, position, consistency, mobility, non-tender Adnexa is negative with normal sized ovaries by sonogram  Musculoskeletal: Normal range of motion. She exhibits no edema and no tenderness.  Neurological: She is alert and oriented to person, place, and time. She has normal reflexes. She displays normal reflexes. No cranial nerve deficit. She exhibits normal muscle tone. Coordination normal.  Skin: Skin is warm and dry. No rash noted. No erythema. No pallor.  Psychiatric: She has a normal mood and affect. Her behavior is normal. Judgment and thought content normal.    Labs: Results for orders placed or performed in visit on 10/11/14 (from the past 336 hour(s))  WET PREP FOR TRICH, YEAST, CLUE   Collection Time: 10/11/14  3:34 PM  Result Value Ref  Range   Yeast Wet Prep HPF POC NONE SEEN NONE SEEN   Trich, Wet Prep NONE SEEN NONE SEEN   Clue Cells Wet Prep HPF POC NONE SEEN NONE SEEN   WBC, Wet Prep HPF POC NONE SEEN NONE SEEN  GC/Chlamydia Probe Amp   Collection Time: 10/11/14  3:34 PM  Result Value Ref Range   CT Probe RNA NEGATIVE    GC Probe RNA NEGATIVE   PAP, ThinPrep ASCUS Rflx HPV Rflx Type   Collection Time: 10/11/14  3:34 PM  Result Value Ref Range   Specimen adequacy: SEE NOTE    FINAL DIAGNOSIS: SEE NOTE    COMMENTS: SEE NOTE    Cytotechnologist: SEE NOTE   CBC with Differential/Platelet   Collection Time: 10/11/14  3:45 PM  Result Value Ref Range   WBC 9.6 4.0 - 10.5 K/uL   RBC 4.93 3.87 - 5.11 MIL/uL   Hemoglobin 11.3 (L) 12.0 - 15.0 g/dL   HCT 35.4 (L) 36.0 - 46.0 %   MCV 71.8 (L) 78.0 - 100.0 fL   MCH 22.9 (L) 26.0 - 34.0 pg   MCHC 31.9 30.0 - 36.0  g/dL   RDW 15.6 (H) 11.5 - 15.5 %   Platelets 366 150 - 400 K/uL   MPV 9.0 8.6 - 12.4 fL   Neutrophils Relative % 62 43 - 77 %   Neutro Abs 6.0 1.7 - 7.7 K/uL   Lymphocytes Relative 30 12 - 46 %   Lymphs Abs 2.9 0.7 - 4.0 K/uL   Monocytes Relative 6 3 - 12 %   Monocytes Absolute 0.6 0.1 - 1.0 K/uL   Eosinophils Relative 2 0 - 5 %   Eosinophils Absolute 0.2 0.0 - 0.7 K/uL   Basophils Relative 0 0 - 1 %   Basophils Absolute 0.0 0.0 - 0.1 K/uL   Smear Review Criteria for review not met   Comprehensive metabolic panel   Collection Time: 10/11/14  3:45 PM  Result Value Ref Range   Sodium 138 135 - 145 mEq/L   Potassium 4.0 3.5 - 5.3 mEq/L   Chloride 104 96 - 112 mEq/L   CO2 28 19 - 32 mEq/L   Glucose, Bld 91 70 - 99 mg/dL   BUN 10 6 - 23 mg/dL   Creat 0.80 0.50 - 1.10 mg/dL   Total Bilirubin 0.3 0.2 - 1.2 mg/dL   Alkaline Phosphatase 106 39 - 117 U/L   AST 15 0 - 37 U/L   ALT 12 0 - 35 U/L   Total Protein 7.2 6.0 - 8.3 g/dL   Albumin 3.9 3.5 - 5.2 g/dL   Calcium 9.2 8.4 - 10.5 mg/dL  TSH   Collection Time: 10/11/14  3:45 PM  Result Value Ref  Range   TSH 1.548 0.350 - 4.500 uIU/mL  Lipid panel   Collection Time: 10/11/14  3:45 PM  Result Value Ref Range   Cholesterol 159 0 - 200 mg/dL   Triglycerides 81 <150 mg/dL   HDL 62 >=46 mg/dL   Total CHOL/HDL Ratio 2.6 Ratio   VLDL 16 0 - 40 mg/dL   LDL Cholesterol 81 0 - 99 mg/dL  HIV antibody (with reflex)   Collection Time: 10/11/14  3:45 PM  Result Value Ref Range   HIV 1&2 Ab, 4th Generation NONREACTIVE NONREACTIVE    EKG: No orders found for this or any previous visit.  Imaging Studies: No results found.    Assessment: Desires sterilization Patient Active Problem List   Diagnosis Date Noted  . N&V (nausea and vomiting) 09/10/2013  . Unspecified contraceptive management 05/21/2013  . Anemia, iron deficiency 11/11/2012  . Generalized anxiety disorder 09/17/2012  . OCD (obsessive compulsive disorder) 09/17/2012  . Bipolar I disorder, most recent episode depressed 08/13/2012  . Insomnia 08/13/2012    Plan: Laparoscopic salpingectomy for sterilization Nov 10, 2014  Robin Delgado 10/14/2014 2:15 PM

## 2014-10-22 ENCOUNTER — Telehealth: Payer: Self-pay | Admitting: *Deleted

## 2014-10-22 NOTE — Telephone Encounter (Signed)
Received call from patient.   Reports that she was advised of therapy sessions today. States that therapist wants to see patient weekly, but with her co-pay ($40) she will not be able to afford that.   Requested to have MD prescribe medication for anxiety.   MD please advise.

## 2014-10-22 NOTE — Telephone Encounter (Signed)
Call placed to patient.   Reports that she is financially unable to pay to go to psychiatrist either.

## 2014-10-22 NOTE — Telephone Encounter (Signed)
She needs to see the psychiatrist, even if she does not do therapy, the therapist does not prescribe medication

## 2014-10-24 NOTE — Telephone Encounter (Signed)
I will try her on 1 more medication but she needs to take the medication, this is the only way we can help her depression and anxiety.   I can not do anything about her insurance coverage and her co-pays Her other [facility], which she can walk in and get established herself.  If she chooses to take the medication send in Effexor 37.5mg  XL  Once a day

## 2014-10-25 MED ORDER — VENLAFAXINE HCL ER 37.5 MG PO CP24: 37.5000 mg | ORAL_CAPSULE | Freq: Every day | ORAL | Status: DC

## 2014-10-25 NOTE — Telephone Encounter (Signed)
Call placed to patient and patient made aware.   States that she would prefer to try the mediation at this time.

## 2014-11-04 NOTE — Patient Instructions (Signed)
Robin Delgado  11/04/2014   Your procedure is scheduled on:   11/10/2014  Report to [facility] at  615  AM.  Call this number if you have problems the morning of surgery: [phone]   Remember:   Do not eat food or drink liquids after midnight.   Take these medicines the morning of surgery with A SIP OF WATER:  effexor   Do not wear jewelry, make-up or nail polish.  Do not wear lotions, powders, or perfumes.   Do not shave 48 hours prior to surgery. Men may shave face and neck.  Do not bring valuables to the hospital.  [facility] is not responsible for any belongings or valuables.               Contacts, dentures or bridgework may not be worn into surgery.  Leave suitcase in the car. After surgery it may be brought to your room.  For patients admitted to the hospital, discharge time is determined by your treatment team.               Patients discharged the day of surgery will not be allowed to drive home.  Name and phone number of your driver: family  Special Instructions: Shower using CHG 2 nights before surgery and the night before surgery.  If you shower the day of surgery use CHG.  Use special wash - you have one bottle of CHG for all showers.  You should use approximately 1/3 of the bottle for each shower.   Please read over the following fact sheets that you were given: Pain Booklet, Coughing and Deep Breathing, Surgical Site Infection Prevention, Anesthesia Post-op Instructions and Care and Recovery After Surgery Sterilization Information, Female Female sterilization is a procedure to permanently prevent pregnancy. There are different ways to perform sterilization, but all either block or close the fallopian tubes so that your eggs cannot reach your uterus. If your egg cannot reach your uterus, sperm cannot fertilize the egg, and you cannot get pregnant.  Sterilization is performed by a surgical procedure. Sometimes these procedures are performed in a hospital  while a patient is asleep. Sometimes they can be done in a clinic setting with the patient awake. The fallopian tubes can be surgically cut, tied, or sealed through a procedure called tubal ligation. The fallopian tubes can also be closed with clips or rings. Sterilization can also be done by placing a tiny coil into each fallopian tube, which causes scar tissue to grow inside the tube. The scar tissue then blocks the tubes.  Discuss sterilization with your caregiver to answer any concerns you or your partner may have. You may want to ask what type of sterilization your caregiver performs. Some caregivers may not perform all the various options. Sterilization is permanent and should only be done if you are sure you do not want children or do not want any more children. Having a sterilization reversed may not be successful.  STERILIZATION PROCEDURES  Laparoscopic sterilization. This is a surgical method performed at a time other than right after childbirth. Two incisions are made in the lower abdomen. A thin, lighted tube (laparoscope) is inserted into one of the incisions and is used to perform the procedure. The fallopian tubes are closed with a ring or a clip. An instrument that uses heat could be used to seal the tubes closed (electrocautery).   Mini-laparotomy. This is a surgical method done 1 or 2 days after  giving birth. Typically, a small incision is made just below the belly button (umbilicus) and the fallopian tubes are exposed. The tubes can then be sealed, tied, or cut.   Hysteroscopic sterilization. This is performed at a time other than right after childbirth. A tiny, spring-like coil is inserted through the cervix and uterus and placed into the fallopian tubes. The coil causes scaring and blocks the tubes. Other forms of contraception should be used for 3 months after the procedure to allow the scar tissue to form completely. Additionally, it is required hysterosalpingography be done 3 months  later to ensure that the procedure was successful. Hysterosalpingography is a procedure that uses X-rays to look at your uterus and fallopian tubes after a material to make them show up better has been inserted. IS STERILIZATION SAFE? Sterilization is considered safe with very rare complications. Risks depend on the type of procedure you have. As with any surgical procedure, there are risks. Some risks of sterilization by any means include:   Bleeding.  Infection.  Reaction to anesthesia medicine.  Injury to surrounding organs. Risks specific to having hysteroscopic coils placed include:  The coils may not be placed correctly the first time.   The coils may move out of place.   The tubes may not get completely blocked after 3 months.   Injury to surrounding organs when placing the coil.  HOW EFFECTIVE IS FEMALE STERILIZATION? Sterilization is nearly 100% effective, but it can fail. Depending on the type of sterilization, the rate of failure can be as high as 3%. After hysteroscopic sterilization with placement of fallopian tube coils, you will need back-up birth control for 3 months after the procedure. Sterilization is effective for a lifetime.  BENEFITS OF STERILIZATION  It does not affect your hormones, and therefore will not affect your menstrual periods, sexual desire, or performance.   It is effective for a lifetime.   It is safe.   You do not need to worry about getting pregnant. Keep in mind that if you had the hysteroscopic placement procedure, you must wait 3 months after the procedure (or until your caregiver confirms) before pregnancy is not considered possible.   There are no side effects unlike other types of birth control (contraception).  DRAWBACKS OF STERILIZATION  You must be sure you do not want children or any more children. The procedure is permanent.   It does not provide protection against sexually transmitted infections (STIs).   The tubes  can grow back together. If this happens, there is a risk of pregnancy. There is also an increased risk (50%) of pregnancy being an ectopic pregnancy. This is a pregnancy that happens outside of the uterus. Document Released: 12/12/2007 Document Revised: 06/30/2013 Document Reviewed: 10/11/2011 [facility] Patient Information 2015 [place], [place]. This information is not intended to replace advice given to you by your health care provider. Make sure you discuss any questions you have with your health care provider. Salpingectomy Salpingectomy, also called tubectomy, is the surgical removal of one of the fallopian tubes. The fallopian tubes are tubes that are connected to the uterus. These tubes transport the egg from the ovary to the uterus. A salpingectomy may be done for various reasons, including:   A tubal (ectopic) pregnancy. This is especially true if the tube ruptures.  An infected fallopian tube.  The need to remove the fallopian tube when removing an ovary with a cyst or tumor.  The need to remove the fallopian tube when removing the uterus.  Cancer  of the fallopian tube or nearby organs. Removing one fallopian tube does not prevent you from becoming pregnant. It also does not cause problems with your menstrual periods.  LET [facility] CARE PROVIDER KNOW ABOUT:  Any allergies you have.  All medicines you are taking, including vitamins, herbs, eye drops, creams, and over-the-counter medicines.  Previous problems you or members of your family have had with the use of anesthetics.  Any blood disorders you have.  Previous surgeries you have had.  Medical conditions you have. RISKS AND COMPLICATIONS  Generally, this is a safe procedure. However, as with any procedure, complications can occur. Possible complications include:  Injury to surrounding organs.  Bleeding.  Infection.  Problems related to anesthesia. BEFORE THE PROCEDURE  Ask your health care provider about changing  or stopping your regular medicines. You may need to stop taking certain medicines, such as aspirin or blood thinners, at least 1 week before the surgery.  Do not eat or drink anything for at least 8 hours before the surgery.  If you smoke, do not smoke for at least 2 weeks before the surgery.  Make plans to have someone drive you home after the procedure or after your hospital stay. Also arrange for someone to help you with activities during recovery. PROCEDURE   You will be given medicine to help you relax before the procedure (sedative). You will then be given medicine to make you sleep through the procedure (general anesthetic). These medicines will be given through an IV access tube that is put into one of your veins.  Once you are asleep, your lower abdomen will be shaved and cleaned. A thin, flexible tube (catheter) will be placed in your bladder.  The surgeon may use a laparoscopic, robotic, or open technique for this surgery:  In the laparoscopic technique, the surgery is done through two small cuts (incisions) in the abdomen. A thin, lighted tube with a tiny camera on the end (laparoscope) is inserted into one of the incisions. The tools needed for the procedure are put through the other incision.  A robotic technique may be chosen to perform complex surgery in a small space. In the robotic technique, small incisions will be made. A camera and surgical instruments are passed through the incisions. Surgical instruments will be controlled with the help of a robotic arm.  In the open technique, the surgery is done through one large incision in the abdomen.  Using any of these techniques, the surgeon removes the fallopian tube from where it attaches to the uterus. The blood vessels will be clamped and tied.  The surgeon then uses staples or stitches to close the incision or incisions. AFTER THE PROCEDURE   You will be taken to a recovery area where your progress will be monitored for  1-3 hours.  If the laparoscopic technique was used, you may be allowed to go home after several hours. You may have some shoulder pain after the laparoscopic procedure. This is normal and usually goes away in a day or two.  If the open technique was used, you will be admitted to the hospital for a couple of days.  You will be given pain medicine if needed.  The IV access tube and catheter will be removed before you are discharged. Document Released: 11/11/2008 Document Revised: 04/15/2013 Document Reviewed: 12/17/2012 [facility] Patient Information 2015 [place], [place]. This information is not intended to replace advice given to you by your health care provider. Make sure you discuss any questions you  have with your health care provider. PATIENT INSTRUCTIONS POST-ANESTHESIA  IMMEDIATELY FOLLOWING SURGERY:  Do not drive or operate machinery for the first twenty four hours after surgery.  Do not make any important decisions for twenty four hours after surgery or while taking narcotic pain medications or sedatives.  If you develop intractable nausea and vomiting or a severe headache please notify your doctor immediately.  FOLLOW-UP:  Please make an appointment with your surgeon as instructed. You do not need to follow up with anesthesia unless specifically instructed to do so.  WOUND CARE INSTRUCTIONS (if applicable):  Keep a dry clean dressing on the anesthesia/puncture wound site if there is drainage.  Once the wound has quit draining you may leave it open to air.  Generally you should leave the bandage intact for twenty four hours unless there is drainage.  If the epidural site drains for more than 36-48 hours please call the anesthesia department.  QUESTIONS?:  Please feel free to call your physician or the hospital operator if you have any questions, and they will be happy to assist you.

## 2014-11-05 ENCOUNTER — Encounter (HOSPITAL_COMMUNITY): Payer: Self-pay

## 2014-11-05 ENCOUNTER — Encounter (HOSPITAL_COMMUNITY)
Admission: RE | Admit: 2014-11-05 | Discharge: 2014-11-05 | Disposition: A | Discharge status: Home / Self Care | Payer: 59 | Source: Ambulatory Visit | Attending: Obstetrics & Gynecology | Admitting: Obstetrics & Gynecology

## 2014-11-05 DIAGNOSIS — Z01812 Encounter for preprocedural laboratory examination: Secondary | ICD-10-CM | POA: Diagnosis present

## 2014-11-05 LAB — CBC
HCT: 35.3 % — ABNORMAL LOW (ref 36.0–46.0)
Hemoglobin: 10.9 g/dL — ABNORMAL LOW (ref 12.0–15.0)
MCH: 23 pg — ABNORMAL LOW (ref 26.0–34.0)
MCHC: 30.9 g/dL (ref 30.0–36.0)
MCV: 74.5 fL — ABNORMAL LOW (ref 78.0–100.0)
Platelets: 263 10*3/uL (ref 150–400)
RBC: 4.74 MIL/uL (ref 3.87–5.11)
RDW: 15.6 % — ABNORMAL HIGH (ref 11.5–15.5)
WBC: 7.8 10*3/uL (ref 4.0–10.5)

## 2014-11-05 LAB — COMPREHENSIVE METABOLIC PANEL
ALT: 22 U/L (ref 0–35)
AST: 23 U/L (ref 0–37)
Albumin: 3.2 g/dL — ABNORMAL LOW (ref 3.5–5.2)
Alkaline Phosphatase: 100 U/L (ref 39–117)
Anion gap: 8 (ref 5–15)
BUN: 9 mg/dL (ref 6–23)
CO2: 25 mmol/L (ref 19–32)
Calcium: 8.6 mg/dL (ref 8.4–10.5)
Chloride: 107 mmol/L (ref 96–112)
Creatinine, Ser: 0.63 mg/dL (ref 0.50–1.10)
GFR calc Af Amer: >90 mL/min (ref >90)
GFR calc non Af Amer: >90 mL/min (ref >90)
Glucose, Bld: 99 mg/dL (ref 70–99)
Potassium: 3.6 mmol/L (ref 3.5–5.1)
Sodium: 140 mmol/L (ref 135–145)
Total Bilirubin: 0.3 mg/dL (ref 0.3–1.2)
Total Protein: 7 g/dL (ref 6.0–8.3)

## 2014-11-05 LAB — URINALYSIS, ROUTINE W REFLEX MICROSCOPIC
Bilirubin Urine: NEGATIVE
Glucose, UA: NEGATIVE mg/dL
Hgb urine dipstick: NEGATIVE
Ketones, ur: NEGATIVE mg/dL
Nitrite: NEGATIVE
Protein, ur: NEGATIVE mg/dL
Specific Gravity, Urine: 1.025 (ref 1.005–1.030)
Urobilinogen, UA: 0.2 mg/dL (ref 0.0–1.0)
pH: 6 (ref 5.0–8.0)

## 2014-11-05 LAB — URINE MICROSCOPIC-ADD ON

## 2014-11-05 LAB — HCG, QUANTITATIVE, PREGNANCY: hCG, Beta Chain, Quant, S: <1 m[IU]/mL (ref <5)

## 2014-11-05 NOTE — Pre-Procedure Instructions (Signed)
Patient given information to patient to sign up for my chart at home.

## 2014-11-10 ENCOUNTER — Encounter (HOSPITAL_COMMUNITY)
Admission: RE | Disposition: A | Discharge status: Home / Self Care | Payer: Self-pay | Source: Ambulatory Visit | Attending: Obstetrics & Gynecology

## 2014-11-10 ENCOUNTER — Ambulatory Visit (HOSPITAL_COMMUNITY)
Admission: RE | Admit: 2014-11-10 | Discharge: 2014-11-10 | Disposition: A | Discharge status: Home / Self Care | Payer: 59 | Source: Ambulatory Visit | Attending: Obstetrics & Gynecology | Admitting: Obstetrics & Gynecology

## 2014-11-10 ENCOUNTER — Ambulatory Visit (HOSPITAL_COMMUNITY): Payer: 59 | Admitting: Anesthesiology

## 2014-11-10 ENCOUNTER — Encounter (HOSPITAL_COMMUNITY): Payer: Self-pay | Admitting: *Deleted

## 2014-11-10 DIAGNOSIS — F42 Obsessive-compulsive disorder: Secondary | ICD-10-CM | POA: Insufficient documentation

## 2014-11-10 DIAGNOSIS — Z79899 Other long term (current) drug therapy: Secondary | ICD-10-CM | POA: Insufficient documentation

## 2014-11-10 DIAGNOSIS — Z7952 Long term (current) use of systemic steroids: Secondary | ICD-10-CM | POA: Diagnosis not present

## 2014-11-10 DIAGNOSIS — F319 Bipolar disorder, unspecified: Secondary | ICD-10-CM | POA: Diagnosis not present

## 2014-11-10 DIAGNOSIS — Z793 Long term (current) use of hormonal contraceptives: Secondary | ICD-10-CM | POA: Insufficient documentation

## 2014-11-10 DIAGNOSIS — Z641 Problems related to multiparity: Secondary | ICD-10-CM | POA: Diagnosis not present

## 2014-11-10 DIAGNOSIS — Z87410 Personal history of cervical dysplasia: Secondary | ICD-10-CM | POA: Diagnosis not present

## 2014-11-10 DIAGNOSIS — F419 Anxiety disorder, unspecified: Secondary | ICD-10-CM | POA: Insufficient documentation

## 2014-11-10 DIAGNOSIS — G4700 Insomnia, unspecified: Secondary | ICD-10-CM | POA: Diagnosis not present

## 2014-11-10 DIAGNOSIS — Z302 Encounter for sterilization: Secondary | ICD-10-CM | POA: Insufficient documentation

## 2014-11-10 HISTORY — PX: LAPAROSCOPIC BILATERAL SALPINGECTOMY: SHX5889

## 2014-11-10 SURGERY — SALPINGECTOMY, BILATERAL, LAPAROSCOPIC
Anesthesia: General | Site: Abdomen | Laterality: Bilateral

## 2014-11-10 MED ORDER — KETOROLAC TROMETHAMINE 30 MG/ML IJ SOLN
30.0000 mg | Freq: Once | INTRAMUSCULAR | Status: AC
  Administered 2014-11-10: 30 mg via INTRAVENOUS
  Filled 2014-11-10: qty 1

## 2014-11-10 MED ORDER — ONDANSETRON HCL 4 MG/2ML IJ SOLN
INTRAMUSCULAR | Status: AC
  Filled 2014-11-10: qty 2

## 2014-11-10 MED ORDER — ROCURONIUM BROMIDE 50 MG/5ML IV SOLN
INTRAVENOUS | Status: AC
  Filled 2014-11-10: qty 1

## 2014-11-10 MED ORDER — MIDAZOLAM HCL 2 MG/2ML IJ SOLN
1.0000 mg | INTRAMUSCULAR | Status: DC | PRN
  Administered 2014-11-10 (×2): 2 mg via INTRAVENOUS

## 2014-11-10 MED ORDER — LACTATED RINGERS IV SOLN
INTRAVENOUS | Status: DC
  Administered 2014-11-10: 1000 mL via INTRAVENOUS

## 2014-11-10 MED ORDER — ONDANSETRON HCL 8 MG PO TABS: 8.0000 mg | ORAL_TABLET | Freq: Three times a day (TID) | ORAL | Status: DC | PRN

## 2014-11-10 MED ORDER — MIDAZOLAM HCL 5 MG/5ML IJ SOLN
INTRAMUSCULAR | Status: DC | PRN
  Administered 2014-11-10 (×2): 1 mg via INTRAVENOUS

## 2014-11-10 MED ORDER — MIDAZOLAM HCL 2 MG/2ML IJ SOLN
INTRAMUSCULAR | Status: AC
  Filled 2014-11-10: qty 2

## 2014-11-10 MED ORDER — ROCURONIUM BROMIDE 100 MG/10ML IV SOLN
INTRAVENOUS | Status: DC | PRN
  Administered 2014-11-10: 30 mg via INTRAVENOUS

## 2014-11-10 MED ORDER — ONDANSETRON HCL 4 MG/2ML IJ SOLN
4.0000 mg | Freq: Once | INTRAMUSCULAR | Status: AC
  Administered 2014-11-10: 4 mg via INTRAVENOUS

## 2014-11-10 MED ORDER — BUPIVACAINE LIPOSOME 1.3 % IJ SUSP
INTRAMUSCULAR | Status: AC
  Filled 2014-11-10: qty 20

## 2014-11-10 MED ORDER — FENTANYL CITRATE (PF) 250 MCG/5ML IJ SOLN
INTRAMUSCULAR | Status: AC
  Filled 2014-11-10: qty 5

## 2014-11-10 MED ORDER — LIDOCAINE HCL 1 % IJ SOLN
INTRAMUSCULAR | Status: DC | PRN
  Administered 2014-11-10: 30 mg via INTRADERMAL

## 2014-11-10 MED ORDER — GLYCOPYRROLATE 0.2 MG/ML IJ SOLN
INTRAMUSCULAR | Status: DC | PRN
  Administered 2014-11-10: 0.4 mg via INTRAVENOUS

## 2014-11-10 MED ORDER — SODIUM CHLORIDE 0.9 % IR SOLN
Status: DC | PRN
  Administered 2014-11-10: 1000 mL

## 2014-11-10 MED ORDER — CEFAZOLIN SODIUM-DEXTROSE 2-3 GM-% IV SOLR
2.0000 g | INTRAVENOUS | Status: AC
  Administered 2014-11-10: 2 g via INTRAVENOUS
  Filled 2014-11-10: qty 50

## 2014-11-10 MED ORDER — LIDOCAINE HCL (PF) 1 % IJ SOLN
INTRAMUSCULAR | Status: AC
  Filled 2014-11-10: qty 5

## 2014-11-10 MED ORDER — FENTANYL CITRATE (PF) 100 MCG/2ML IJ SOLN
INTRAMUSCULAR | Status: DC | PRN
  Administered 2014-11-10 (×3): 50 ug via INTRAVENOUS

## 2014-11-10 MED ORDER — PROPOFOL 10 MG/ML IV BOLUS
INTRAVENOUS | Status: DC | PRN
  Administered 2014-11-10: 130 mg via INTRAVENOUS

## 2014-11-10 MED ORDER — FENTANYL CITRATE (PF) 100 MCG/2ML IJ SOLN: 25.0000 ug | INTRAMUSCULAR | Status: DC | PRN

## 2014-11-10 MED ORDER — PROPOFOL 10 MG/ML IV BOLUS
INTRAVENOUS | Status: AC
  Filled 2014-11-10: qty 20

## 2014-11-10 MED ORDER — KETOROLAC TROMETHAMINE 10 MG PO TABS: 10.0000 mg | ORAL_TABLET | Freq: Three times a day (TID) | ORAL | Status: DC | PRN

## 2014-11-10 MED ORDER — BUPIVACAINE LIPOSOME 1.3 % IJ SUSP
20.0000 mL | Freq: Once | INTRAMUSCULAR | Status: DC
  Filled 2014-11-10: qty 20

## 2014-11-10 MED ORDER — GLYCOPYRROLATE 0.2 MG/ML IJ SOLN
INTRAMUSCULAR | Status: AC
Start: 2014-11-10 — End: 2014-11-10
  Filled 2014-11-10: qty 2

## 2014-11-10 MED ORDER — BUPIVACAINE LIPOSOME 1.3 % IJ SUSP
INTRAMUSCULAR | Status: DC | PRN
  Administered 2014-11-10: 20 mL

## 2014-11-10 MED ORDER — NEOSTIGMINE METHYLSULFATE 10 MG/10ML IV SOLN
INTRAVENOUS | Status: DC | PRN
  Administered 2014-11-10: 2 mg via INTRAVENOUS
  Administered 2014-11-10: 1 mg via INTRAVENOUS

## 2014-11-10 MED ORDER — HYDROCODONE-ACETAMINOPHEN 5-325 MG PO TABS: 1.0000 | ORAL_TABLET | Freq: Four times a day (QID) | ORAL | Status: DC | PRN

## 2014-11-10 MED ORDER — ONDANSETRON HCL 4 MG/2ML IJ SOLN: 4.0000 mg | Freq: Once | INTRAMUSCULAR | Status: DC | PRN

## 2014-11-10 SURGICAL SUPPLY — 48 items
APPLIER CLIP 5 13 M/L LIGAMAX5 (MISCELLANEOUS)
APR CLP MED LRG 5 ANG JAW (MISCELLANEOUS)
BAG HAMPER (MISCELLANEOUS) ×2 IMPLANT
BLADE SURG SZ11 CARB STEEL (BLADE) ×2 IMPLANT
CATH ROBINSON RED A/P 16FR (CATHETERS) ×2 IMPLANT
CLIP APPLIE 5 13 M/L LIGAMAX5 (MISCELLANEOUS) IMPLANT
CLOTH BEACON ORANGE TIMEOUT ST (SAFETY) ×2 IMPLANT
COVER LIGHT HANDLE STERIS (MISCELLANEOUS) ×4 IMPLANT
DRAPE PROXIMA HALF (DRAPES) ×2 IMPLANT
ELECT REM PT RETURN 9FT ADLT (ELECTROSURGICAL) ×2
ELECTRODE REM PT RTRN 9FT ADLT (ELECTROSURGICAL) ×1 IMPLANT
FILTER SMOKE EVAC LAPAROSHD (FILTER) ×2 IMPLANT
FORMALIN 10 PREFIL 480ML (MISCELLANEOUS) ×2 IMPLANT
GAUZE SPONGE 4X4 16PLY XRAY LF (GAUZE/BANDAGES/DRESSINGS) IMPLANT
GLOVE BIO SURGEON STRL SZ 6.5 (GLOVE) ×2 IMPLANT
GLOVE BIOGEL PI IND STRL 7.0 (GLOVE) IMPLANT
GLOVE BIOGEL PI IND STRL 7.5 (GLOVE) IMPLANT
GLOVE BIOGEL PI IND STRL 8 (GLOVE) ×1 IMPLANT
GLOVE BIOGEL PI INDICATOR 7.0 (GLOVE) ×2
GLOVE BIOGEL PI INDICATOR 7.5 (GLOVE) ×1
GLOVE BIOGEL PI INDICATOR 8 (GLOVE) ×1
GLOVE ECLIPSE 8.0 STRL XLNG CF (GLOVE) ×2 IMPLANT
GOWN STRL REUS W/TWL LRG LVL3 (GOWN DISPOSABLE) ×2 IMPLANT
GOWN STRL REUS W/TWL XL LVL3 (GOWN DISPOSABLE) ×2 IMPLANT
INST SET LAPROSCOPIC GYN AP (KITS) ×2 IMPLANT
IV NS IRRIG 3000ML ARTHROMATIC (IV SOLUTION) IMPLANT
KIT ROOM TURNOVER APOR (KITS) ×2 IMPLANT
MANIFOLD NEPTUNE II (INSTRUMENTS) ×2 IMPLANT
NDL INSUFFLATION 14GA 120MM (NEEDLE) ×1 IMPLANT
NEEDLE INSUFFLATION 14GA 120MM (NEEDLE) ×2 IMPLANT
PACK PERI GYN (CUSTOM PROCEDURE TRAY) ×2 IMPLANT
PAD ARMBOARD 7.5X6 YLW CONV (MISCELLANEOUS) ×2 IMPLANT
SCALPEL HARMONIC ACE (MISCELLANEOUS) ×2 IMPLANT
SET BASIN LINEN APH (SET/KITS/TRAYS/PACK) ×2 IMPLANT
SET TUBE IRRIG SUCTION NO TIP (IRRIGATION / IRRIGATOR) IMPLANT
SLEEVE ENDOPATH XCEL 5M (ENDOMECHANICALS) ×2 IMPLANT
SOLUTION ANTI FOG 6CC (MISCELLANEOUS) ×2 IMPLANT
SPONGE GAUZE 2X2 8PLY STRL LF (GAUZE/BANDAGES/DRESSINGS) ×3 IMPLANT
STAPLER VISISTAT 35W (STAPLE) ×2 IMPLANT
SUT VICRYL 0 UR6 27IN ABS (SUTURE) ×2 IMPLANT
SYR 20CC LL (SYRINGE) ×1 IMPLANT
SYRINGE 10CC LL (SYRINGE) ×2 IMPLANT
TAPE CLOTH SURG 4X10 WHT LF (GAUZE/BANDAGES/DRESSINGS) ×2 IMPLANT
TRAY FOLEY CATH 16FR SILVER (SET/KITS/TRAYS/PACK) ×2 IMPLANT
TROCAR ENDO BLADELESS 11MM (ENDOMECHANICALS) ×2 IMPLANT
TROCAR XCEL NON-BLD 5MMX100MML (ENDOMECHANICALS) ×2 IMPLANT
TUBING INSUF HEATED (TUBING) ×2 IMPLANT
WARMER LAPAROSCOPE (MISCELLANEOUS) ×2 IMPLANT

## 2014-11-10 NOTE — Discharge Instructions (Signed)
Laparoscopic Tubal Ligation °Care After °Refer to this sheet in the next few weeks. These instructions provide you with information on caring for yourself after your procedure. Your caregiver may also give you more specific instructions. Your treatment has been planned according to current medical practices, but problems sometimes occur. Call your caregiver if you have any problems or questions after your procedure. °HOME CARE INSTRUCTIONS  °· Rest the remainder of the day. °· Only take over-the-counter or prescription medicines for pain, discomfort, or fever as directed by your caregiver. Do not take aspirin. It can cause bleeding. °· Gradually resume daily activities, diet, rest, driving, and work. °· Avoid sexual intercourse for 2 weeks or as directed. °· Do not use tampons or douche. °· Do not drive while taking pain medicine. °· Do not lift anything over 5 pounds for 2 weeks or as directed. °· Only take showers, not baths, until you are seen by your caregiver. °· Change bandages (dressings) as directed. °· Take your temperature twice a day and record it. °· Try to have help for the first 7 to 10 days for your household needs. °· Return to your caregiver to get your stitches (sutures) removed and for follow-up visits as directed. °SEEK MEDICAL CARE IF:  °· You have redness, swelling, or increasing pain in a wound. °· You have drainage from a wound lasting longer than 1 day. °· Your pain is getting worse. °· You have a rash. °· You become dizzy or lightheaded. °· You have a reaction to your medicine. °· You need stronger medicine or a change in your pain medicine. °· You notice a bad smell coming from a wound or dressing. °· Your wound breaks open after the sutures have been removed. °· You are constipated. °SEEK IMMEDIATE MEDICAL CARE IF:  °· You faint. °· You have a fever. °· You have increasing abdominal pain. °· You have severe pain in your shoulders. °· You have bleeding or drainage from the suture sites or  vagina following surgery. °· You have shortness of breath or difficulty breathing. °· You have chest or leg pain. °· You have persistent nausea, vomiting, or diarrhea. °MAKE SURE YOU:  °· Understand these instructions. °· Watch your condition. °· Get help right away if you are not doing well or get worse. °Document Released: 01/12/2005 Document Revised: 12/25/2011 Document Reviewed: 10/06/2011 °[employer]® Patient Information ©2015 [employer]. This information is not intended to replace advice given to you by your health care provider. Make sure you discuss any questions you have with your health care provider. ° °

## 2014-11-10 NOTE — Anesthesia Postprocedure Evaluation (Signed)
  Anesthesia Post-op Note  Patient: Robin LeepCynthia N Fiebelkorn  Procedure(s) Performed: Procedure(s): LAPAROSCOPIC BILATERAL SALPINGECTOMY (Bilateral)  Patient Location: PACU  Anesthesia Type:General  Level of Consciousness: awake, alert , oriented and patient cooperative  Airway and Oxygen Therapy: Patient Spontanous Breathing  Post-op Pain: 2 /10, mild  Post-op Assessment: Post-op Vital signs reviewed, Patient's Cardiovascular Status Stable, Respiratory Function Stable, Patent Airway, No signs of Nausea or vomiting, Pain level controlled and No headache  Post-op Vital Signs: Reviewed and stable  Last Vitals:  Filed Vitals:   11/10/14 0840  BP: 127/74  Pulse: 57  Temp: 36.3 C  Resp: 9    Complications: No apparent anesthesia complications

## 2014-11-10 NOTE — Transfer of Care (Signed)
Immediate Anesthesia Transfer of Care Note  Patient: Robin LeepCynthia N Watanabe  Procedure(s) Performed: Procedure(s): LAPAROSCOPIC BILATERAL SALPINGECTOMY (Bilateral)  Patient Location: PACU  Anesthesia Type:General  Level of Consciousness: awake, alert  and patient cooperative  Airway & Oxygen Therapy: Patient Spontanous Breathing and Patient connected to face mask oxygen  Post-op Assessment: Report given to RN, Post -op Vital signs reviewed and stable and Patient moving all extremities  Post vital signs: Reviewed and stable  Last Vitals:  Filed Vitals:   11/10/14 0730  BP: 134/85  Temp:   Resp: 15    Complications: No apparent anesthesia complications

## 2014-11-10 NOTE — Anesthesia Preprocedure Evaluation (Signed)
Anesthesia Evaluation  Patient identified by MRN, date of birth, ID band Patient awake    Reviewed: Allergy & Precautions, H&P , NPO status , Patient's Chart, lab work & pertinent test results  Airway Mallampati: II  TM Distance: >3 FB Neck ROM: Full    Dental  (+) Teeth Intact   Pulmonary neg pulmonary ROS,  breath sounds clear to auscultation        Cardiovascular negative cardio ROS  Rhythm:Regular Rate:Normal     Neuro/Psych PSYCHIATRIC DISORDERS Depression Bipolar Disorder    GI/Hepatic negative GI ROS,   Endo/Other    Renal/GU      Musculoskeletal   Abdominal   Peds  Hematology  (+) anemia ,   Anesthesia Other Findings   Reproductive/Obstetrics                             Anesthesia Physical Anesthesia Plan  ASA: II  Anesthesia Plan: General   Post-op Pain Management:    Induction: Intravenous  Airway Management Planned: Oral ETT  Additional Equipment:   Intra-op Plan:   Post-operative Plan: Extubation in OR  Informed Consent: I have reviewed the patients History and Physical, chart, labs and discussed the procedure including the risks, benefits and alternatives for the proposed anesthesia with the patient or authorized representative who has indicated his/her understanding and acceptance.     Plan Discussed with:   Anesthesia Plan Comments:         Anesthesia Quick Evaluation

## 2014-11-10 NOTE — H&P (Signed)
DOB: 1981/02/23, 34 y.o. MRN: [id number] Preoperative History and Physical  Robin Delgado is a 34 y.o. Multiparous femaled. with Patient's last menstrual period was 10/01/2014 (approximate). admitted for a laparoscopic salpingectomy for sterilization.    PMH:  Past Medical History  Diagnosis Date  . Depression   . STD (sexually transmitted disease)     Trichomonas,   . CIN II (cervical intraepithelial neoplasia II) 2008    Colpo/Leep   . Anemia     PSH:  Past Surgical History  Procedure Laterality Date  . Dilation and curettage of uterus N/A 07/09/1998    and 02/07/2003  . Esophagogastroduodenoscopy N/A 09/15/2013    Procedure: ESOPHAGOGASTRODUODENOSCOPY (EGD); Surgeon: Danie Binder, MD; Location: [facility]; Service: Endoscopy; Laterality: N/A; 11:30-moved to [place] notified pt  . Cholecystectomy N/A 09/25/2013    Procedure: LAPAROSCOPIC CHOLECYSTECTOMY; Surgeon: Jamesetta So, MD; Location: [facility]; Service: General; Laterality: N/A;    POb/GynH:  OB History    No data available      SH:  History  Substance Use Topics  . Smoking status: Never Smoker   . Smokeless tobacco: Never Used  . Alcohol Use: No    FH:  Family History  Problem Relation Age of Onset  . Hypertension Mother   . Cancer Father     stomach   . Hypertension Brother   . Alcohol abuse Brother   . ADD / ADHD Son   . Anxiety disorder Brother   . Depression Brother   . Drug abuse Neg Hx   . Bipolar disorder Neg Hx   . Dementia Neg Hx   . OCD Neg Hx   . Paranoid behavior Neg Hx   . Schizophrenia Neg Hx   . Seizures Neg Hx   . Sexual abuse Neg Hx   . Physical abuse Neg Hx   . Diabetes Maternal Grandmother   . Colon cancer Neg Hx      Allergies:  Allergies  Allergen Reactions  . Macrobid [Nitrofurantoin  Macrocrystal] Itching    Medications:  Current outpatient prescriptions:  . mirtazapine (REMERON) 15 MG tablet, Take 1 tablet (15 mg total) by mouth at bedtime., Disp: 30 tablet, Rfl: 3 . norelgestromin-ethinyl estradiol (XULANE) 150-35 MCG/24HR transdermal patch, Place 1 patch onto the skin once a week., Disp: 3 patch, Rfl: 6 . clotrimazole-betamethasone (LOTRISONE) cream, Apply 1 application topically 2 (two) times daily. (Patient not taking: Reported on 10/14/2014), Disp: 30 g, Rfl: 0  Review of Systems:   Review of Systems  Constitutional: Negative for fever, chills, weight loss, malaise/fatigue and diaphoresis.  HENT: Negative for hearing loss, ear pain, nosebleeds, congestion, sore throat, neck pain, tinnitus and ear discharge.  Eyes: Negative for blurred vision, double vision, photophobia, pain, discharge and redness.  Respiratory: Negative for cough, hemoptysis, sputum production, shortness of breath, wheezing and stridor.  Cardiovascular: Negative for chest pain, palpitations, orthopnea, claudication, leg swelling and PND.  Gastrointestinal: Positive for abdominal pain. Negative for heartburn, nausea, vomiting, diarrhea, constipation, blood in stool and melena.  Genitourinary: Negative for dysuria, urgency, frequency, hematuria and flank pain.  Musculoskeletal: Negative for myalgias, back pain, joint pain and falls.  Skin: Negative for itching and rash.  Neurological: Negative for dizziness, tingling, tremors, sensory change, speech change, focal weakness, seizures, loss of consciousness, weakness and headaches.  Endo/Heme/Allergies: Negative for environmental allergies and polydipsia. Does not bruise/bleed easily.  Psychiatric/Behavioral: Negative for depression, suicidal ideas, hallucinations, memory loss and substance abuse. The patient is not nervous/anxious and  does not have insomnia.     PHYSICAL EXAM:  Blood pressure 120/70, pulse 72, height $RemoveBe'5\' 1"'TfLTPjIhx$  (1.549 m),  weight 154 lb (69.854 kg), last menstrual period 10/01/2014.   Vitals reviewed. Constitutional: She is oriented to person, place, and time. She appears well-developed and well-nourished.  HENT:  Head: Normocephalic and atraumatic.  Right Ear: External ear normal.  Left Ear: External ear normal.  Nose: Nose normal.  Mouth/Throat: Oropharynx is clear and moist.  Eyes: Conjunctivae and EOM are normal. Pupils are equal, round, and reactive to light. Right eye exhibits no discharge. Left eye exhibits no discharge. No scleral icterus.  Neck: Normal range of motion. Neck supple. No tracheal deviation present. No thyromegaly present.  Cardiovascular: Normal rate, regular rhythm, normal heart sounds and intact distal pulses. Exam reveals no gallop and no friction rub.  No murmur heard. Respiratory: Effort normal and breath sounds normal. No respiratory distress. She has no wheezes. She has no rales. She exhibits no tenderness.  GI: Soft. Bowel sounds are normal. She exhibits no distension and no mass. There is tenderness. There is no rebound and no guarding.  Genitourinary:   Vulva is normal without lesions Vagina is pink moist without discharge Cervix normal in appearance and pap is normal Uterus is normal size, contour, position, consistency, mobility, non-tender Adnexa is negative with normal sized ovaries by sonogram  Musculoskeletal: Normal range of motion. She exhibits no edema and no tenderness.  Neurological: She is alert and oriented to person, place, and time. She has normal reflexes. She displays normal reflexes. No cranial nerve deficit. She exhibits normal muscle tone. Coordination normal.  Skin: Skin is warm and dry. No rash noted. No erythema. No pallor.  Psychiatric: She has a normal mood and affect. Her behavior is normal. Judgment and thought content normal.    Labs: Results for orders placed or performed in visit on 10/11/14 (from the past 336 hour(s))  WET PREP  FOR TRICH, YEAST, CLUE   Collection Time: 10/11/14 3:34 PM  Result Value Ref Range   Yeast Wet Prep HPF POC NONE SEEN NONE SEEN   Trich, Wet Prep NONE SEEN NONE SEEN   Clue Cells Wet Prep HPF POC NONE SEEN NONE SEEN   WBC, Wet Prep HPF POC NONE SEEN NONE SEEN  GC/Chlamydia Probe Amp   Collection Time: 10/11/14 3:34 PM  Result Value Ref Range   CT Probe RNA NEGATIVE    GC Probe RNA NEGATIVE   PAP, ThinPrep ASCUS Rflx HPV Rflx Type   Collection Time: 10/11/14 3:34 PM  Result Value Ref Range   Specimen adequacy: SEE NOTE    FINAL DIAGNOSIS: SEE NOTE    COMMENTS: SEE NOTE    Cytotechnologist: SEE NOTE   CBC with Differential/Platelet   Collection Time: 10/11/14 3:45 PM  Result Value Ref Range   WBC 9.6 4.0 - 10.5 K/uL   RBC 4.93 3.87 - 5.11 MIL/uL   Hemoglobin 11.3 (L) 12.0 - 15.0 g/dL   HCT 35.4 (L) 36.0 - 46.0 %   MCV 71.8 (L) 78.0 - 100.0 fL   MCH 22.9 (L) 26.0 - 34.0 pg   MCHC 31.9 30.0 - 36.0 g/dL   RDW 15.6 (H) 11.5 - 15.5 %   Platelets 366 150 - 400 K/uL   MPV 9.0 8.6 - 12.4 fL   Neutrophils Relative % 62 43 - 77 %   Neutro Abs 6.0 1.7 - 7.7 K/uL   Lymphocytes Relative 30 12 - 46 %   Lymphs  Abs 2.9 0.7 - 4.0 K/uL   Monocytes Relative 6 3 - 12 %   Monocytes Absolute 0.6 0.1 - 1.0 K/uL   Eosinophils Relative 2 0 - 5 %   Eosinophils Absolute 0.2 0.0 - 0.7 K/uL   Basophils Relative 0 0 - 1 %   Basophils Absolute 0.0 0.0 - 0.1 K/uL   Smear Review Criteria for review not met   Comprehensive metabolic panel   Collection Time: 10/11/14 3:45 PM  Result Value Ref Range   Sodium 138 135 - 145 mEq/L   Potassium 4.0 3.5 - 5.3 mEq/L   Chloride 104 96 - 112 mEq/L   CO2 28 19 - 32 mEq/L   Glucose, Bld 91 70 - 99 mg/dL   BUN 10 6 - 23 mg/dL   Creat 0.80 0.50 - 1.10 mg/dL   Total Bilirubin  0.3 0.2 - 1.2 mg/dL   Alkaline Phosphatase 106 39 - 117 U/L   AST 15 0 - 37 U/L   ALT 12 0 - 35 U/L   Total Protein 7.2 6.0 - 8.3 g/dL   Albumin 3.9 3.5 - 5.2 g/dL   Calcium 9.2 8.4 - 10.5 mg/dL  TSH   Collection Time: 10/11/14 3:45 PM  Result Value Ref Range   TSH 1.548 0.350 - 4.500 uIU/mL  Lipid panel   Collection Time: 10/11/14 3:45 PM  Result Value Ref Range   Cholesterol 159 0 - 200 mg/dL   Triglycerides 81 <150 mg/dL   HDL 62 >=46 mg/dL   Total CHOL/HDL Ratio 2.6 Ratio   VLDL 16 0 - 40 mg/dL   LDL Cholesterol 81 0 - 99 mg/dL  HIV antibody (with reflex)   Collection Time: 10/11/14 3:45 PM  Result Value Ref Range   HIV 1&2 Ab, 4th Generation NONREACTIVE NONREACTIVE    EKG: No orders found for this or any previous visit.  Imaging Studies:  Imaging Results    No results found.      Assessment: Desires sterilization Patient Active Problem List   Diagnosis Date Noted  . N&V (nausea and vomiting) 09/10/2013  . Unspecified contraceptive management 05/21/2013  . Anemia, iron deficiency 11/11/2012  . Generalized anxiety disorder 09/17/2012  . OCD (obsessive compulsive disorder) 09/17/2012  . Bipolar I disorder, most recent episode depressed 08/13/2012  . Insomnia 08/13/2012    Plan: Laparoscopic salpingectomy for sterilization Nov 10, 2014  Florian Buff    11/10/2014 7:27 AM

## 2014-11-10 NOTE — Op Note (Signed)
Preoperative Diagnosis:  1.  Multiparous female desires permanent sterilization                                          2.  Elects to have bilateral salpingectomy for ovarian cancer prophylaxis  Postoperative Diagnosis:  Same as above  Procedure:  Laparoscopic Bilateral Salpingectomy  Surgeon:  Rockne CoonsLuther H Roland Prine  Jr MD  Anaesthesia: general  Findings:  Patient had normal pelvic anatomy and no intraperitoneal abnormalities.  Description of Operation:  Patient was taken to the OR and placed into supine position where she underwent general anaesthesia.  She was placed in the dorsal lithotomy position and prepped and draped in the usual sterile fashion.  An incision was made in the umbilicus and dissection taken down to the rectus fascia which was incised and opened.  The non bladed trocar was then placed and the peritoneal cavity was insufflated.  The above noted findings were observed.  Additional trocars were placed in the right and left lower quadrants under direct visualization without difficulty.  The Harmonic scalpel was employed and salpingectomy of both the right and left tubes was performed.   The tubes were removed from the peritoneal cavity and sent to pathology.  There was good hemostasis bilaterally.  The fascia, peritoneum and subcutaneous tissue were closed using 0 vicryl.  The skin was closed using staples.  Exparel 266 mg 20 cc was injected in the 3 incision trocar sites. The patient was awakened from anaesthesia and taken to the PACU with all counts being correct x 3.  The patient received  2 gram of ancef andToradol 30 mg IV preoperatively.  Shaunda Tipping H 11/10/2014 8:22 AM

## 2014-11-10 NOTE — Anesthesia Procedure Notes (Signed)
Procedure Name: Intubation Date/Time: 11/10/2014 7:44 AM Performed by: Despina HiddenIDACAVAGE, Alva Broxson J Pre-anesthesia Checklist: Patient being monitored, Suction available, Emergency Drugs available and Patient identified Patient Re-evaluated:Patient Re-evaluated prior to inductionOxygen Delivery Method: Circle system utilized Preoxygenation: Pre-oxygenation with 100% oxygen Intubation Type: IV induction Ventilation: Mask ventilation without difficulty and Oral airway inserted - appropriate to patient size Laryngoscope Size: Mac and 3 Grade View: Grade II Tube type: Oral Tube size: 7.0 mm Number of attempts: 1 Airway Equipment and Method: Stylet Placement Confirmation: ETT inserted through vocal cords under direct vision,  positive ETCO2 and breath sounds checked- equal and bilateral Secured at: 22 cm Tube secured with: Tape Dental Injury: Teeth and Oropharynx as per pre-operative assessment

## 2014-11-11 ENCOUNTER — Encounter (HOSPITAL_COMMUNITY): Payer: Self-pay | Admitting: Obstetrics & Gynecology

## 2014-11-17 ENCOUNTER — Ambulatory Visit (INDEPENDENT_AMBULATORY_CARE_PROVIDER_SITE_OTHER): Payer: 59 | Admitting: Obstetrics & Gynecology

## 2014-11-17 ENCOUNTER — Encounter: Payer: Self-pay | Admitting: Obstetrics & Gynecology

## 2014-11-17 VITALS — BP 150/100 | HR 72 | Ht 63.0 in | Wt 153.4 lb

## 2014-11-17 DIAGNOSIS — Z9889 Other specified postprocedural states: Secondary | ICD-10-CM

## 2014-11-17 NOTE — Progress Notes (Signed)
Patient ID: Robin Delgado, female   DOB: 08/04/1980, 34 y.o.   MRN: [id number]  HPI: Patient returns for routine postoperative follow-up having undergone laparoscopic bilateral salpingectomy on 11/10/2014.  The patient's immediate postoperative recovery has been unremarkable. Since hospital discharge the patient reports not admitted, no problems.   Current Outpatient Prescriptions: clotrimazole-betamethasone (LOTRISONE) cream, Apply 1 application topically 2 (two) times daily., Disp: 30 g, Rfl: 0 HYDROcodone-acetaminophen (NORCO/VICODIN) 5-325 MG per tablet, Take 1 tablet by mouth every 6 (six) hours as needed., Disp: 30 tablet, Rfl: 0 mirtazapine (REMERON) 15 MG tablet, Take 1 tablet (15 mg total) by mouth at bedtime., Disp: 30 tablet, Rfl: 3 ketorolac (TORADOL) 10 MG tablet, Take 1 tablet (10 mg total) by mouth every 8 (eight) hours as needed. (Patient not taking: Reported on 11/17/2014), Disp: 15 tablet, Rfl: 0 norelgestromin-ethinyl estradiol Burr Medico(XULANE) 150-35 MCG/24HR transdermal patch, Place 1 patch onto the skin once a week. (Patient not taking: Reported on 11/17/2014), Disp: 3 patch, Rfl: 6 ondansetron (ZOFRAN) 8 MG tablet, Take 1 tablet (8 mg total) by mouth every 8 (eight) hours as needed for nausea. (Patient not taking: Reported on 11/17/2014), Disp: 12 tablet, Rfl: 0 venlafaxine XR (EFFEXOR XR) 37.5 MG 24 hr capsule, Take 1 capsule (37.5 mg total) by mouth daily with breakfast. (Patient not taking: Reported on 11/17/2014), Disp: 30 capsule, Rfl: 3  No current facility-administered medications for this visit.    Blood pressure 150/100, pulse 72, height 5\' 3"  (1.6 m), weight 153 lb 6.4 oz (69.582 kg), last menstrual period 11/12/2014.  Physical Exam: 3 incisions clean dry intact staples removed  Diagnostic Tests: none  Pathology: benign  Impression: S/P bilateral salpingectomy for sterilization  Plan:   Follow up: 1  years  Lazaro ArmsEURE,Marc Leichter H, MD

## 2014-11-29 ENCOUNTER — Ambulatory Visit (INDEPENDENT_AMBULATORY_CARE_PROVIDER_SITE_OTHER): Payer: 59 | Admitting: Family Medicine

## 2014-11-29 ENCOUNTER — Encounter: Payer: Self-pay | Admitting: Family Medicine

## 2014-11-29 VITALS — BP 122/76 | HR 68 | Temp 101.0°F | Resp 14 | Ht 62.0 in | Wt 146.0 lb

## 2014-11-29 DIAGNOSIS — J020 Streptococcal pharyngitis: Secondary | ICD-10-CM

## 2014-11-29 DIAGNOSIS — R300 Dysuria: Secondary | ICD-10-CM

## 2014-11-29 DIAGNOSIS — N760 Acute vaginitis: Secondary | ICD-10-CM

## 2014-11-29 LAB — URINALYSIS, ROUTINE W REFLEX MICROSCOPIC
Glucose, UA: NEGATIVE mg/dL
Ketones, ur: 40 mg/dL — AB
Nitrite: NEGATIVE
Protein, ur: 30 mg/dL — AB
Specific Gravity, Urine: 1.02 (ref 1.005–1.030)
pH: 6 (ref 5.0–8.0)

## 2014-11-29 LAB — URINALYSIS, MICROSCOPIC ONLY
Casts: NONE SEEN
Crystals: NONE SEEN

## 2014-11-29 LAB — WET PREP FOR TRICH, YEAST, CLUE
Clue Cells Wet Prep HPF POC: NONE SEEN
Trich, Wet Prep: NONE SEEN
Yeast Wet Prep HPF POC: NONE SEEN

## 2014-11-29 LAB — RAPID STREP SCREEN (MED CTR MEBANE ONLY): Streptococcus, Group A Screen (Direct): POSITIVE — AB

## 2014-11-29 MED ORDER — AMOXICILLIN 400 MG/5ML PO SUSR
ORAL | Status: DC
Start: 2014-11-29 — End: 2015-02-18

## 2014-11-29 NOTE — Progress Notes (Signed)
   Subjective:    Patient ID: Robin Delgado, female    DOB: 03/13/1981, 34 y.o.   MRN: [id number]  Patient presents for ER F/U and Illness  Pt here to f/u ER, seen in ER sat due to pelvic pain, diagnosed with UTI, PID, told she had BV and a trace of Trichomonas seen in a urine sample. +sexually active with new partner. Denies vaginal bleeding Given shot of rocephin, metrogel and doxycycline  Past 2 days had had sore throat, fever, no cough, mild headache, no vomiting, pelvic pain improved    Review Of Systems:  GEN- denies fatigue,+ fever, weight loss,weakness, recent illness HEENT- denies eye drainage, change in vision, nasal discharge, CVS- denies chest pain, palpitations RESP- denies SOB, cough, wheeze ABD- denies N/V, change in stools, abd pain GU- denies dysuria, hematuria, dribbling, incontinence MSK- denies joint pain, muscle aches, injury Neuro- denies headache, dizziness, syncope, seizure activity       Objective:    BP 122/76 mmHg  Pulse 68  Temp(Src) 101 F (38.3 C) (Oral)  Resp 14  Ht 5\' 2"  (1.575 m)  Wt 146 lb (66.225 kg)  BMI 26.70 kg/m2  LMP 11/12/2014 GEN- NAD, alert and oriented x3 HEENT- PERRL, EOMI, non injected sclera, pink conjunctiva, MMM, oropharynx injected, exudates noted, film on tongue Neck- Supple, +LAD CVS- RRR, no murmur RESP-CTAB ABD-NABS,soft,NT,ND, no CVA tenderness  GU- normal external genitalia, vaginal mucosa pink and moist, cervix visualized no growth, no blood form os, + thick discharge, no CMT, no ovarian masses, uterus normal size EXT- No edema Pulses- Radial  2+        Assessment & Plan:      Problem List Items Addressed This Visit    None    Visit Diagnoses    Dysuria    -  Primary    UA still has bacteria present, send for culture to see if accurately treated    Relevant Orders    Urinalysis, Routine w reflex microscopic (Completed)    Urine culture    Strep pharyngitis        Positive strep, treat with  amox,  ibuprofen, had neg HIV test last month    Relevant Orders    Rapid Strep Screen (Completed)    Vaginitis and vulvovaginitis        Wet prep neg, GC pending, discussed safe sex, complete doxycycline for presumed PID and Metrogel for BV    Relevant Orders    WET PREP FOR TRICH, YEAST, CLUE (Completed)    GC/Chlamydia Probe Amp       Note: This dictation was prepared with Dragon dictation along with smaller Lobbyistphrase technology. Any transcriptional errors that result from this process are unintentional.

## 2014-11-29 NOTE — Patient Instructions (Signed)
Continue doxycycline Take amoxicillin as prescribed for strep throat  Salt water gargle, ibuprofen for fever  F/U as previous

## 2014-12-01 ENCOUNTER — Ambulatory Visit: Payer: 59 | Admitting: Family Medicine

## 2014-12-01 LAB — URINE CULTURE
Colony Count: NO GROWTH
Organism ID, Bacteria: NO GROWTH

## 2014-12-01 LAB — GC/CHLAMYDIA PROBE AMP
CT Probe RNA: NEGATIVE
GC Probe RNA: NEGATIVE

## 2014-12-13 ENCOUNTER — Telehealth: Payer: Self-pay | Admitting: *Deleted

## 2014-12-13 NOTE — Telephone Encounter (Signed)
Pt states she had a Laparoscopic bilateral Salpingectomy on 11/10/2014 were her tubes were removed. Had a 1 day episode of cramping and bleeding, concerned about getting pregnant. Per Cyril MourningJennifer Griffin, NP with the procedure the pt had there is a 99.9 % that she cannot get pregnant. Pt verbalized understanding and stated would call the office back if she wanted to f/u with Dr. Despina HiddenEure for the episode of vaginal bleeding.

## 2014-12-20 ENCOUNTER — Telehealth (HOSPITAL_COMMUNITY): Payer: Self-pay | Admitting: *Deleted

## 2014-12-30 ENCOUNTER — Telehealth (HOSPITAL_COMMUNITY): Payer: Self-pay | Admitting: *Deleted

## 2015-01-04 ENCOUNTER — Telehealth (HOSPITAL_COMMUNITY): Payer: Self-pay | Admitting: *Deleted

## 2015-02-18 ENCOUNTER — Ambulatory Visit (INDEPENDENT_AMBULATORY_CARE_PROVIDER_SITE_OTHER): Payer: 59 | Admitting: Family Medicine

## 2015-02-18 ENCOUNTER — Telehealth: Payer: Self-pay | Admitting: Family Medicine

## 2015-02-18 ENCOUNTER — Other Ambulatory Visit: Payer: Self-pay | Admitting: Family Medicine

## 2015-02-18 ENCOUNTER — Encounter: Payer: Self-pay | Admitting: Family Medicine

## 2015-02-18 ENCOUNTER — Ambulatory Visit: Payer: Self-pay | Admitting: Family Medicine

## 2015-02-18 VITALS — BP 118/74 | HR 84 | Temp 97.8°F | Resp 16 | Wt 147.0 lb

## 2015-02-18 DIAGNOSIS — N390 Urinary tract infection, site not specified: Secondary | ICD-10-CM

## 2015-02-18 DIAGNOSIS — F411 Generalized anxiety disorder: Secondary | ICD-10-CM

## 2015-02-18 DIAGNOSIS — N912 Amenorrhea, unspecified: Secondary | ICD-10-CM | POA: Diagnosis not present

## 2015-02-18 LAB — CBC WITH DIFFERENTIAL/PLATELET
Basophils Absolute: 0 10*3/uL (ref 0.0–0.1)
Basophils Relative: 0 % (ref 0–1)
Eosinophils Absolute: 0.1 10*3/uL (ref 0.0–0.7)
Eosinophils Relative: 1 % (ref 0–5)
HCT: 39 % (ref 36.0–46.0)
Hemoglobin: 12.6 g/dL (ref 12.0–15.0)
Lymphocytes Relative: 30 % (ref 12–46)
Lymphs Abs: 2.9 10*3/uL (ref 0.7–4.0)
MCH: 23 pg — ABNORMAL LOW (ref 26.0–34.0)
MCHC: 32.3 g/dL (ref 30.0–36.0)
MCV: 71 fL — ABNORMAL LOW (ref 78.0–100.0)
MPV: 8.7 fL (ref 8.6–12.4)
Monocytes Absolute: 0.4 10*3/uL (ref 0.1–1.0)
Monocytes Relative: 4 % (ref 3–12)
Neutro Abs: 6.3 10*3/uL (ref 1.7–7.7)
Neutrophils Relative %: 65 % (ref 43–77)
Platelets: 319 10*3/uL (ref 150–400)
RBC: 5.49 MIL/uL — ABNORMAL HIGH (ref 3.87–5.11)
RDW: 16.8 % — ABNORMAL HIGH (ref 11.5–15.5)
WBC: 9.7 10*3/uL (ref 4.0–10.5)

## 2015-02-18 LAB — URINALYSIS, MICROSCOPIC ONLY
Casts: NONE SEEN [LPF]
Crystals: NONE SEEN [HPF]
Yeast: NONE SEEN [HPF]

## 2015-02-18 LAB — URINALYSIS, ROUTINE W REFLEX MICROSCOPIC
Bilirubin Urine: NEGATIVE
Glucose, UA: NEGATIVE
Ketones, ur: NEGATIVE
Nitrite: NEGATIVE
Protein, ur: NEGATIVE
Specific Gravity, Urine: 1.02 (ref 1.001–1.035)
pH: 6.5 (ref 5.0–8.0)

## 2015-02-18 LAB — PREGNANCY, URINE: Preg Test, Ur: NEGATIVE

## 2015-02-18 LAB — TSH: TSH: 1.409 u[IU]/mL (ref 0.350–4.500)

## 2015-02-18 MED ORDER — CIPROFLOXACIN HCL 500 MG PO TABS: 500.0000 mg | ORAL_TABLET | Freq: Two times a day (BID) | ORAL | Status: DC

## 2015-02-18 MED ORDER — LORAZEPAM 0.5 MG PO TABS
0.5000 mg | ORAL_TABLET | Freq: Two times a day (BID) | ORAL | Status: DC | PRN
Start: 2015-02-18 — End: 2015-05-16

## 2015-02-18 MED ORDER — CLOTRIMAZOLE-BETAMETHASONE 1-0.05 % EX CREA: 1.0000 "application " | TOPICAL_CREAM | Freq: Two times a day (BID) | CUTANEOUS | Status: DC

## 2015-02-18 NOTE — Telephone Encounter (Signed)
Spoke to pt and she states that her ins will not [facility] and needs the [facility]ivan called into [facility] in [place]. [facility] and RX CXD and med called into [facility] in [place].

## 2015-02-18 NOTE — Patient Instructions (Addendum)
We will call with lab results Try the ativan along with the remeron Take antibiotics as prescribed  F/U as previous

## 2015-02-18 NOTE — Progress Notes (Signed)
Patient ID: Robin Delgado, female   DOB: 1981-05-24, 34 y.o.   MRN: [id number]       Subjective:    Patient ID: Robin Delgado, female    DOB: [birth year], 34 y.o.   MRN: [id number]  Patient presents for Abnormal urine odor and Wants something for nerves  Asian here with dysuria and strong odor to her urine for the past week. She denies any vaginal discharge. She denies any abdominal pain flank pain or fever associated. She is also concerned because her menses has been abnormal. She had bilateral salpingectomy done back in May and she had cycles but she is missed her cycle this month. She also states that the dates of been a little off with her previous cycles. She is sexually active.  History of anxiety depression concern for bipolar she is taking the Remeron most of the time however she still has a lot of anxiety she wants to try something for her nerves as needed. At this time she does decline going back to psychiatry however she will be seeing a therapist with her son next week.    Review Of Systems:  GEN- denies fatigue, fever, weight loss,weakness, recent illness HEENT- denies eye drainage, change in vision, nasal discharge, CVS- denies chest pain, palpitations RESP- denies SOB, cough, wheeze ABD- denies N/V, change in stools, abd pain GU- + dysuria, hematuria, dribbling, incontinence MSK- denies joint pain, muscle aches, injury Neuro- denies headache, dizziness, syncope, seizure activity       Objective:    BP 118/74 mmHg  Pulse 84  Temp(Src) 97.8 F (36.6 C) (Oral)  Resp 16  Wt 147 lb (66.679 kg) GEN- NAD, alert and oriented x3 ABD-NABS,soft,NT,ND, no CVA tenderness Psych- normal affect and mood  EXT- No edema Pulses- Radial  2+        Assessment & Plan:      Problem List Items Addressed This Visit    Generalized anxiety disorder (Chronic)    Other Visit Diagnoses    UTI (lower urinary tract infection)    -  Primary    Ciprox 3 days    Relevant  Medications    clotrimazole-betamethasone (LOTRISONE) cream    Other Relevant Orders    Urinalysis, Routine w reflex microscopic (not at [facility]) (Completed)    Amenorrhea        U preg neg, check hormones, likley stress related, shoudl continue to have cycles as ovaries and uterus in tact    Relevant Orders    Pregnancy, urine (Completed)    CBC with Differential/Platelet    TSH    FSH/LH       Note: This dictation was prepared with Dragon dictation along with smaller phrase technology. Any transcriptional errors that result from this process are unintentional.

## 2015-02-18 NOTE — Assessment & Plan Note (Signed)
Depression with anxiety, continue remeron, though i am concerned with weight gain and have voiced this to her, but she will not take any other med and it helps with sleep Add ativan prn She will be seeing therapist with son next wed

## 2015-02-18 NOTE — Telephone Encounter (Signed)
Error

## 2015-02-19 LAB — FSH/LH
FSH: 4.9 m[IU]/mL
LH: 0.7 m[IU]/mL

## 2015-03-21 ENCOUNTER — Ambulatory Visit: Payer: 59 | Admitting: Adult Health

## 2015-04-05 ENCOUNTER — Ambulatory Visit (INDEPENDENT_AMBULATORY_CARE_PROVIDER_SITE_OTHER): Payer: 59 | Admitting: Family Medicine

## 2015-04-05 ENCOUNTER — Encounter: Payer: Self-pay | Admitting: Family Medicine

## 2015-04-05 VITALS — BP 118/62 | HR 70 | Temp 97.9°F | Resp 14 | Ht 62.0 in | Wt 150.0 lb

## 2015-04-05 DIAGNOSIS — A499 Bacterial infection, unspecified: Secondary | ICD-10-CM | POA: Diagnosis not present

## 2015-04-05 DIAGNOSIS — N760 Acute vaginitis: Secondary | ICD-10-CM

## 2015-04-05 DIAGNOSIS — B379 Candidiasis, unspecified: Secondary | ICD-10-CM

## 2015-04-05 DIAGNOSIS — B9689 Other specified bacterial agents as the cause of diseases classified elsewhere: Secondary | ICD-10-CM

## 2015-04-05 LAB — WET PREP FOR TRICH, YEAST, CLUE: Trich, Wet Prep: NONE SEEN

## 2015-04-05 MED ORDER — CLOTRIMAZOLE-BETAMETHASONE 1-0.05 % EX CREA: 1.0000 "application " | TOPICAL_CREAM | Freq: Two times a day (BID) | CUTANEOUS | Status: DC

## 2015-04-05 MED ORDER — METRONIDAZOLE 0.75 % VA GEL: 1.0000 | Freq: Two times a day (BID) | VAGINAL | Status: DC

## 2015-04-05 MED ORDER — FLUCONAZOLE 150 MG PO TABS: ORAL_TABLET | ORAL | Status: DC

## 2015-04-05 NOTE — Addendum Note (Signed)
Addended by: Milinda Antis F on: 04/05/2015 01:38 PM   Modules accepted: Orders

## 2015-04-05 NOTE — Progress Notes (Signed)
Patient ID: Robin Delgado, female   DOB: 1981-05-19, 34 y.o.   MRN: [id number]   Subjective:    Patient ID: Robin Delgado, female    DOB: 03-Nov-1980, 34 y.o.   MRN: [id number]  Patient presents for Vaginal Irritation   patient here with vaginal discharge irritation for the past week. She states she is getting heavy discharge. No change in sexual partners. Denies abdominal pain no abnormal bleeding her last period was on September 6. She denies any pain with urination.   Review Of Systems:  GEN- denies fatigue, fever, weight loss,weakness, recent illness HEENT- denies eye drainage, change in vision, nasal discharge, CVS- denies chest pain, palpitations RESP- denies SOB, cough, wheeze ABD- denies N/V, change in stools, abd pain GU- denies dysuria, hematuria, dribbling, incontinence MSK- denies joint pain, muscle aches, injury Neuro- denies headache, dizziness, syncope, seizure activity       Objective:    BP 118/62 mmHg  Pulse 70  Temp(Src) 97.9 F (36.6 C) (Oral)  Resp 14  Ht  (1.575 m)  Wt 150 lb (68.04 kg)  BMI 27.43 kg/m2  LMP 03/15/2015 (Approximate) GEN- NAD, alert and oriented x3 ABD-NABS,soft,NT,ND Breast- normal symmetry, no nipple inversion,no nipple drainage, no nodules or lumps felt Nodes- no axillary nodes GU- normal external genitalia, vaginal mucosa pink and moist, cervix visualized no growth, no blood form os,+ thick white discharge, no CMT, no ovarian masses, uterus normal size         Assessment & Plan:      Problem List Items Addressed This Visit    None    Visit Diagnoses    BV (bacterial vaginosis)    -  Primary     CONCURRENT BACTERIAL VAGINOSIS WITH SOME YEAST OVERGROWTH AS WELL. wE'LL GIVE HER mETROgEL TO USE AND DIFLUCAN    Relevant Medications    fluconazole (DIFLUCAN) 150 MG tablet    Other Relevant Orders    WET PREP FOR TRICH, YEAST, CLUE (Completed)    GC/Chlamydia Probe Amp    Yeast infection        Relevant  Medications    fluconazole (DIFLUCAN) 150 MG tablet       Note: This dictation was prepared with Dragon dictation along with smaller phrase technology. Any transcriptional errors that result from this process are unintentional.

## 2015-04-05 NOTE — Patient Instructions (Addendum)
Take yeast medication as prescribed Use suppository Avoid junk food, sugar sodas, fried food, fast food Eat veggies, fruit, drink 8 glasses of water a day  F/U change F/U to December

## 2015-04-06 LAB — GC/CHLAMYDIA PROBE AMP
CT Probe RNA: NEGATIVE
GC Probe RNA: NEGATIVE

## 2015-04-08 ENCOUNTER — Other Ambulatory Visit: Payer: Self-pay | Admitting: Family Medicine

## 2015-04-08 MED ORDER — METRONIDAZOLE 0.75 % VA GEL: 1.0000 | Freq: Two times a day (BID) | VAGINAL | Status: DC

## 2015-04-08 NOTE — Telephone Encounter (Signed)
[facility]  [facility] gel sent to [facility].

## 2015-04-08 NOTE — Telephone Encounter (Signed)
Pt called.  [facility] does not have vaginal gel and won't until next week.  [facility] [place]  Rx sent.

## 2015-04-15 ENCOUNTER — Ambulatory Visit: Payer: 59 | Admitting: Family Medicine

## 2015-05-02 ENCOUNTER — Encounter: Payer: Self-pay | Admitting: Obstetrics & Gynecology

## 2015-05-02 ENCOUNTER — Ambulatory Visit (INDEPENDENT_AMBULATORY_CARE_PROVIDER_SITE_OTHER): Payer: 59 | Admitting: Obstetrics & Gynecology

## 2015-05-02 VITALS — BP 120/80 | HR 68 | Wt 157.0 lb

## 2015-05-02 DIAGNOSIS — A499 Bacterial infection, unspecified: Secondary | ICD-10-CM | POA: Diagnosis not present

## 2015-05-02 DIAGNOSIS — R102 Pelvic and perineal pain: Secondary | ICD-10-CM

## 2015-05-02 DIAGNOSIS — N760 Acute vaginitis: Secondary | ICD-10-CM | POA: Diagnosis not present

## 2015-05-02 DIAGNOSIS — B9689 Other specified bacterial agents as the cause of diseases classified elsewhere: Secondary | ICD-10-CM

## 2015-05-02 MED ORDER — CIPROFLOXACIN HCL 500 MG PO TABS: 500.0000 mg | ORAL_TABLET | Freq: Two times a day (BID) | ORAL | Status: DC

## 2015-05-02 MED ORDER — METRONIDAZOLE 500 MG PO TABS: 500.0000 mg | ORAL_TABLET | Freq: Two times a day (BID) | ORAL | Status: DC

## 2015-05-02 NOTE — Progress Notes (Signed)
Patient ID: Robin Delgado, female   DOB: [birth year], 34 y.o.   MRN: [id number] Chief Complaint  Patient presents with  . gyn visit    pain lower part of stomach x 4 day / vaginal discharge.    Blood pressure 120/80, pulse 68, weight 157 lb (71.215 kg), last menstrual period 04/17/2015.  34 y.o. No obstetric history on file. Patient's last menstrual period was 04/17/2015. The current method of family planning is tubal ligation.  Subjective Pelvic pain for 4-5 days, episodic both sides L>R No bleeding associated  Some discharge No sex Lasts for 1 hour Sharp not crampy  Objective Abdomen soft non tender no masses non distended  Vulva:  normal appearing vulva with no masses, tenderness or lesions Vagina:  normal mucosa, thin grey discharge Cervix:  no cervical motion tenderness and no lesions Uterus:  Retroverted ?enlarged Adnexa: ovaries:present,  tenderness left    Pertinent ROS No burning with urination, frequency or urgency No nausea, vomiting or diarrhea Nor fever chills or other constitutional symptoms   Labs or studies Wet Prep:   A sample of vaginal discharge was obtained from the posterior fornix using a cotton swab. 2 drops of saline were placed on a slide and the cotton swab was immersed in the saline. Microscopic evaluation was performed and results were as follows:  Negative  for yeast  Positive for clue cells , consistent with Bacterial vaginosis Negative for trichomonas  Normal WBC population   Whiff test: Positive     Impression Diagnoses this Encounter::   ICD-9-CM ICD-10-CM   1. Pelvic pain in female 625.9 R10.2 US Pelvis Complete     US Transvaginal Non-OB  2. BV (bacterial vaginosis) 616.10 N76.0    041.9 A49.9     Established relevant diagnosis(es):   Plan/Recommendations: Meds ordered this encounter  Medications  . metroNIDAZOLE (FLAGYL) 500 MG tablet    Sig: Take 1 tablet (500 mg total) by mouth 2 (two) times daily.     Dispense:  14 tablet    Refill:  0  . ciprofloxacin (CIPRO) 500 MG tablet    Sig: Take 1 tablet (500 mg total) by mouth 2 (two) times daily.    Dispense:  20 tablet    Refill:  0    Labs or Scans Ordered: Orders Placed This Encounter  Procedures  . US Pelvis Complete  . US Transvaginal Non-OB      Follow up Return in about 2 weeks (around 05/16/2015) for GYN sono, with Dr Despina Hidden.      All questions were answered.  Past Medical History  Diagnosis Date  . Depression   . STD (sexually transmitted disease)     Trichomonas,   . CIN II (cervical intraepithelial neoplasia II) 2008    Colpo/Leep   . Anemia     Past Surgical History  Procedure Laterality Date  . Dilation and curettage of uterus N/A 07/09/1998    and 02/07/2003  . Esophagogastroduodenoscopy N/A 09/15/2013    Procedure: ESOPHAGOGASTRODUODENOSCOPY (EGD);  Surgeon: West Bali, MD;  [facility];  Service: Endoscopy;  Laterality: N/A;  11:30-moved to 1030 [address] notified pt  . Cholecystectomy N/A 09/25/2013    Procedure: LAPAROSCOPIC CHOLECYSTECTOMY;  Surgeon: Dalia Heading, MD;  Location: [facility];  Service: General;  Laterality: N/A;  . Laparoscopic bilateral salpingectomy Bilateral 11/10/2014    Procedure: LAPAROSCOPIC BILATERAL SALPINGECTOMY;  Surgeon: Lazaro Arms, MD;  Location: [facility];  Service: Gynecology;  Laterality: Bilateral;  OB History    No data available      Allergies  Allergen Reactions  . Macrobid [Nitrofurantoin Macrocrystal] Itching    Social History   Social History  . Marital Status: Single    Spouse Name: N/A  . Number of Children: N/A  . 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 Smoking status: Never Smoker   . Smokeless tobacco: Never Used  . Alcohol Use: No  . Drug Use: No  . Sexual Activity: Yes    Birth Control/ Protection: Patch   Other Topics Concern  . None   Social History Narrative     Family History  Problem Relation Age of Onset  . Hypertension Mother   . Cancer Father     stomach   . Hypertension Brother   . Alcohol abuse Brother   . ADD / ADHD Son   . Anxiety disorder Brother   . Depression Brother   . Drug abuse Neg Hx   . Bipolar disorder Neg Hx   . Dementia Neg Hx   . OCD Neg Hx   . Paranoid behavior Neg Hx   . Schizophrenia Neg Hx   . Seizures Neg Hx   . Sexual abuse Neg Hx   . Physical abuse Neg Hx   . Diabetes Maternal Grandmother   . Colon cancer Neg Hx

## 2015-05-03 ENCOUNTER — Telehealth: Payer: Self-pay | Admitting: Obstetrics & Gynecology

## 2015-05-03 ENCOUNTER — Telehealth: Payer: Self-pay | Admitting: *Deleted

## 2015-05-03 MED ORDER — METRONIDAZOLE 0.75 % VA GEL: VAGINAL | Status: DC

## 2015-05-03 NOTE — Telephone Encounter (Signed)
Pt informed metronidazole gel e-scribed.

## 2015-05-04 ENCOUNTER — Ambulatory Visit: Payer: 59 | Admitting: Family Medicine

## 2015-05-05 ENCOUNTER — Encounter (HOSPITAL_COMMUNITY): Payer: Self-pay | Admitting: Psychiatry

## 2015-05-05 ENCOUNTER — Ambulatory Visit (INDEPENDENT_AMBULATORY_CARE_PROVIDER_SITE_OTHER): Payer: 59 | Admitting: Psychiatry

## 2015-05-05 DIAGNOSIS — F331 Major depressive disorder, recurrent, moderate: Secondary | ICD-10-CM | POA: Diagnosis not present

## 2015-05-05 DIAGNOSIS — F411 Generalized anxiety disorder: Secondary | ICD-10-CM | POA: Diagnosis not present

## 2015-05-05 NOTE — Patient Instructions (Signed)
Discussed orally 
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
## 2015-05-06 ENCOUNTER — Ambulatory Visit (INDEPENDENT_AMBULATORY_CARE_PROVIDER_SITE_OTHER): Payer: 59 | Admitting: Psychiatry

## 2015-05-06 ENCOUNTER — Encounter (HOSPITAL_COMMUNITY): Payer: Self-pay | Admitting: Psychiatry

## 2015-05-06 VITALS — BP 124/76 | Ht 62.0 in | Wt 155.0 lb

## 2015-05-06 DIAGNOSIS — F411 Generalized anxiety disorder: Secondary | ICD-10-CM

## 2015-05-06 MED ORDER — ALPRAZOLAM 0.5 MG PO TABS: 0.5000 mg | ORAL_TABLET | Freq: Every day | ORAL | Status: DC | PRN
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
## 2015-05-09 NOTE — Telephone Encounter (Signed)
RX sent

## 2015-05-10 ENCOUNTER — Telehealth: Payer: Self-pay | Admitting: *Deleted

## 2015-05-10 MED ORDER — CLINDAMYCIN PHOS-BENZOYL PEROX 1-5 % EX GEL: Freq: Two times a day (BID) | CUTANEOUS | Status: DC

## 2015-05-10 NOTE — Telephone Encounter (Signed)
Okay to refill? 

## 2015-05-10 NOTE — Telephone Encounter (Signed)
Received call from patient.   Reports that the name of the cream she would like refills is Clindamycin 1.2% in Benzol Peroxide 5% Gel.   MD to be made aware.

## 2015-05-10 NOTE — Telephone Encounter (Signed)
Prescription sent to pharmacy. .   Call placed to patient and patient made aware.  

## 2015-05-16 ENCOUNTER — Ambulatory Visit (INDEPENDENT_AMBULATORY_CARE_PROVIDER_SITE_OTHER): Payer: 59

## 2015-05-16 ENCOUNTER — Telehealth: Payer: Self-pay | Admitting: *Deleted

## 2015-05-16 ENCOUNTER — Ambulatory Visit (INDEPENDENT_AMBULATORY_CARE_PROVIDER_SITE_OTHER): Payer: 59 | Admitting: Obstetrics & Gynecology

## 2015-05-16 ENCOUNTER — Encounter: Payer: Self-pay | Admitting: Obstetrics & Gynecology

## 2015-05-16 VITALS — BP 120/80 | HR 70 | Ht 63.0 in | Wt 155.0 lb

## 2015-05-16 DIAGNOSIS — N939 Abnormal uterine and vaginal bleeding, unspecified: Secondary | ICD-10-CM | POA: Diagnosis not present

## 2015-05-16 DIAGNOSIS — R102 Pelvic and perineal pain: Secondary | ICD-10-CM

## 2015-05-16 MED ORDER — NORELGESTROMIN-ETH ESTRADIOL 150-35 MCG/24HR TD PTWK: 1.0000 | MEDICATED_PATCH | TRANSDERMAL | Status: DC

## 2015-05-16 NOTE — Progress Notes (Signed)
Patient ID: Robin Delgado, female   DOB: January 16, 1981, 34 y.o.   MRN: [id number] Follow up appointment for results  Chief Complaint  Patient presents with  . Follow-up    ultrasound today.    Blood pressure 120/80, pulse 70, height '5\' 3"'$  (1.6 m), weight 155 lb (70.308 kg), last menstrual period 05/13/2015.  US Transvaginal Non-ob  05/16/2015  GYNECOLOGIC SONOGRAM Robin Delgado is a 34 y.o. LMP 05/13/2015 for a pelvic sonogram for pelvic pain. Uterus                      7.9 x 3.6 x 5.8 cm, normal retroverted uterus Endometrium          3 mm, symmetrical, wnl Right ovary             2.6 x 2 x 2.3 cm,,rt adnexa contains a dilated tubular structure w/debris 6.8 x 2.5 x 2.9 cm ,normal rt ov with a dominate follicle 2.1 x 1.4 x [id number] Left ovary                2.6 x 1.4 x 1.6 cm, wnl Technician Comments: PELVIC TA/TV: homogenous retroverted uterus,rt adnexa contains a dilated tubular structure w/debris,normal rt ov with a dominate follicle 2.1 x 1.4 x [id number] lt ov,no free fluid,both ov's appear to be mobile,no pain during ultrasound, EEC 3 mm Robin Delgado 05/16/2015 10:37 AM Clinical Impression and recommendations: I have reviewed the sonogram results above, combined with the patient's current clinical course, below are my impressions and any appropriate recommendations for management based on the sonographic findings. Post tubal ligation hydrosalpinx on the right Otherwise normal gyn anatomy, Robin Delgado 05/16/2015 10:57 AM   US Pelvis Complete  05/16/2015  GYNECOLOGIC SONOGRAM Robin Delgado is a 34 y.o. LMP 05/13/2015 for a pelvic sonogram for pelvic pain. Uterus                      7.9 x 3.6 x 5.8 cm, normal retroverted uterus Endometrium          3 mm, symmetrical, wnl Right ovary             2.6 x 2 x 2.3 cm,,rt adnexa contains a dilated tubular structure w/debris 6.8 x 2.5 x 2.9 cm ,normal rt ov with a dominate follicle 2.1 x 1.4 x [id number] Left ovary                2.6 x 1.4 x  1.6 cm, wnl Technician Comments: PELVIC TA/TV: homogenous retroverted uterus,rt adnexa contains a dilated tubular structure w/debris,normal rt ov with a dominate follicle 2.1 x 1.4 x [id number] lt ov,no free fluid,both ov's appear to be mobile,no pain during ultrasound, EEC 3 mm Robin Delgado 05/16/2015 10:37 AM Clinical Impression and recommendations: I have reviewed the sonogram results above, combined with the patient's current clinical course, below are my impressions and any appropriate recommendations for management based on the sonographic findings. Post tubal ligation hydrosalpinx on the right Otherwise normal gyn anatomy, Robin Delgado 05/16/2015 10:57 AM    Pt has resolved pain after the cipro/flagyl Wants to restart her otho evra for cycle management, she is skipping periods and having more emotional issues around period as well  MEDS ordered this encounter: Meds ordered this encounter  Medications  . norelgestromin-ethinyl estradiol (ORTHO EVRA) 150-35 MCG/24HR transdermal patch    Sig: Place 1 patch onto the skin once a week.    Dispense:  3  patch    Refill:  12    Orders for this encounter: No orders of the defined types were placed in this encounter.    Plan: Ortho evra for cycle management  Follow Up:     Face to face time:  15 minutes  Greater than 50% of the visit time was spent in counseling and coordination of care with the patient.  The summary and outline of the counseling and care coordination is summarized in the note above.   All questions were answered.  Past Medical History  Diagnosis Date  . Depression   . STD (sexually transmitted disease)     Trichomonas,   . CIN II (cervical intraepithelial neoplasia II) 2008    Colpo/Leep   . Anemia     Past Surgical History  Procedure Laterality Date  . Dilation and curettage of uterus N/A 07/09/1998    and 02/07/2003  . Esophagogastroduodenoscopy N/A 09/15/2013    Procedure: ESOPHAGOGASTRODUODENOSCOPY (EGD);   Surgeon: Danie Binder, MD;  Location: [facility];  Service: Endoscopy;  Laterality: N/A;  11:30-moved to [place] notified pt  . Cholecystectomy N/A 09/25/2013    Procedure: LAPAROSCOPIC CHOLECYSTECTOMY;  Surgeon: Jamesetta So, MD;  Location: [facility];  Service: General;  Laterality: N/A;  . Laparoscopic bilateral salpingectomy Bilateral 11/10/2014    Procedure: LAPAROSCOPIC BILATERAL SALPINGECTOMY;  Surgeon: Florian Buff, MD;  Location: [facility];  Service: Gynecology;  Laterality: Bilateral;  . Tubal ligation      OB History    No data available      Allergies  Allergen Reactions  . Macrobid [Nitrofurantoin Macrocrystal] Itching    Social History   Social History  . Marital Status: Single    Spouse Name: N/A  . Number of Children: N/A  . 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 Smoking status: Never Smoker   . Smokeless tobacco: Never Used  . Alcohol Use: Yes     Comment: Drinks occasionally - glass of wine  . Drug Use: No  . Sexual Activity: Yes    Birth Control/ Protection: Patch, Surgical   Other Topics Concern  . None   Social History Narrative    Family History  Problem Relation Age of Onset  . Hypertension Mother   . Alcohol abuse Mother   . Cancer Father     stomach   . Hypertension Brother   . Alcohol abuse Brother   . ADD / ADHD Son   . Anxiety disorder Brother   . Depression Brother   . Drug abuse Neg Hx   . Bipolar disorder Neg Hx   . Dementia Neg Hx   . OCD Neg Hx   . Paranoid behavior Neg Hx   . Schizophrenia Neg Hx   . Seizures Neg Hx   . Sexual abuse Neg Hx   . Physical abuse Neg Hx   . Colon cancer Neg Hx   . Diabetes Maternal Grandmother

## 2015-05-16 NOTE — Telephone Encounter (Signed)
Received request from pharmacy for PA on Benzaclin gel.   PA submitted.   Dx: acne

## 2015-05-16 NOTE — Progress Notes (Signed)
PELVIC TA/TV: homogenous retroverted uterus,rt adnexa contains a dilated tubular structure w/debris,normal rt ov with a dominate follicle 2.1 x 1.4 x 1.7cm,normal lt ov,no free fluid,both ov's appear to be mobile,no pain during ultrasound, EEC 3 mm

## 2015-05-17 NOTE — Telephone Encounter (Signed)
Received PA determination.   PA approved 05/16/2015- indefinite.   PA- [id number].  Pharmacy made aware.

## 2015-05-19 ENCOUNTER — Encounter (HOSPITAL_COMMUNITY): Payer: Self-pay | Admitting: Psychiatry

## 2015-05-19 ENCOUNTER — Ambulatory Visit (INDEPENDENT_AMBULATORY_CARE_PROVIDER_SITE_OTHER): Payer: 59 | Admitting: Psychiatry

## 2015-05-19 DIAGNOSIS — F411 Generalized anxiety disorder: Secondary | ICD-10-CM | POA: Diagnosis not present
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
## 2015-05-19 NOTE — Patient Instructions (Signed)
Discussed orally 

## 2015-05-30 ENCOUNTER — Encounter: Payer: Self-pay | Admitting: Obstetrics & Gynecology

## 2015-05-30 ENCOUNTER — Ambulatory Visit (INDEPENDENT_AMBULATORY_CARE_PROVIDER_SITE_OTHER): Payer: 59 | Admitting: Obstetrics & Gynecology

## 2015-05-30 VITALS — BP 110/80 | HR 76 | Wt 156.0 lb

## 2015-05-30 DIAGNOSIS — R350 Frequency of micturition: Secondary | ICD-10-CM

## 2015-05-30 DIAGNOSIS — B373 Candidiasis of vulva and vagina: Secondary | ICD-10-CM | POA: Diagnosis not present

## 2015-05-30 DIAGNOSIS — B3731 Acute candidiasis of vulva and vagina: Secondary | ICD-10-CM

## 2015-05-30 LAB — POCT URINALYSIS DIPSTICK
Blood, UA: NEGATIVE
Glucose, UA: NEGATIVE
Ketones, UA: NEGATIVE
Leukocytes, UA: NEGATIVE
Nitrite, UA: NEGATIVE
Protein, UA: NEGATIVE

## 2015-05-30 MED ORDER — TERCONAZOLE 0.4 % VA CREA: 1.0000 | TOPICAL_CREAM | Freq: Every day | VAGINAL | Status: DC

## 2015-05-30 NOTE — Progress Notes (Signed)
Patient ID: Robin PigeonCynthia N Kropp, female   DOB: 05/14/1981, 34 y.o.   MRN: [id number]      Chief Complaint  Patient presents with  . gyn visit    frequent urination/ left side of labia swollen.    Blood pressure 110/80, pulse 76, weight 156 lb (70.761 kg), last menstrual period 05/13/2015.  34 y.o. No obstetric history on file. Patient's last menstrual period was 05/13/2015 (exact date). The current method of family planning is none., has Rx for ortho evra will start with her next cycle  Subjective Pt with left sided vaginal irritation for 6 days feels like she needs to go to [facility] constantly Some LLQ discomfort as before, recent sonogram was normal  Objective Vulva:  normal appearing vulva with no masses, tenderness or lesions Vagina:  normal mucosa, curd-like discharge Cervix:  no lesions Uterus:  not examined Adnexa: ovaries:,      Pertinent ROS No burning with urination,  No nausea, vomiting or diarrhea Nor fever chills or other constitutional symptoms   Labs or studies     Impression Diagnoses this Encounter::   ICD-9-CM ICD-10-CM   1. Yeast vaginitis 112.1 B37.3     Established relevant diagnosis(es):   Plan/Recommendations: Meds ordered this encounter  Medications  . terconazole (TERAZOL 7) 0.4 % vaginal cream    Sig: Place 1 applicator vaginally at bedtime.    Dispense:  45 g    Refill:  0    Labs or Scans Ordered: No orders of the defined types were placed in this encounter.      Follow up prn        All questions were answered.

## 2015-05-30 NOTE — Addendum Note (Signed)
Addended by: Federico FlakeNES, PEGGY A on: 05/30/2015 02:27 PM   Modules accepted: Orders

## 2015-05-31 ENCOUNTER — Ambulatory Visit (HOSPITAL_COMMUNITY): Payer: Self-pay | Admitting: Psychiatry

## 2015-06-07 ENCOUNTER — Telehealth (HOSPITAL_COMMUNITY): Payer: Self-pay | Admitting: *Deleted

## 2015-06-07 NOTE — Telephone Encounter (Signed)
Called pt and lmtcb number provided. 

## 2015-06-07 NOTE — Telephone Encounter (Signed)
phone call to patient regarding appointment for 07/05/15.    Patient said to cancel all appointments.    She is not taking the Xanax anymore, it is interfering with her other medications.   When asked to please come for another visit to discuss her decision with the doctor, she said she is not taking the medicine any more.   When asked what are her plans for refills, she said she is not going to take the medicine anymore.     She said she don't have time to come to appointments, she has a lot of things to do with her children and they come first.

## 2015-06-08 NOTE — Telephone Encounter (Signed)
lmtcb number provided 

## 2015-06-09 NOTE — Telephone Encounter (Signed)
lmtcb number provided 

## 2015-06-13 NOTE — Telephone Encounter (Signed)
noted 

## 2015-06-17 ENCOUNTER — Ambulatory Visit (HOSPITAL_COMMUNITY): Payer: Self-pay | Admitting: Psychiatry

## 2015-06-20 ENCOUNTER — Ambulatory Visit (HOSPITAL_COMMUNITY): Payer: Self-pay | Admitting: Psychiatry

## 2015-06-29 ENCOUNTER — Other Ambulatory Visit: Payer: Self-pay | Admitting: *Deleted

## 2015-06-29 MED ORDER — ALPRAZOLAM 0.5 MG PO TABS: 0.5000 mg | ORAL_TABLET | Freq: Every day | ORAL | Status: DC | PRN

## 2015-06-29 MED ORDER — CLINDAMYCIN PHOS-BENZOYL PEROX 1.2-5 % EX GEL: 1.0000 "application " | Freq: Two times a day (BID) | CUTANEOUS | Status: DC

## 2015-07-05 ENCOUNTER — Ambulatory Visit (HOSPITAL_COMMUNITY): Payer: Self-pay | Admitting: Psychiatry

## 2015-08-03 ENCOUNTER — Telehealth: Payer: Self-pay | Admitting: Family Medicine

## 2015-08-03 ENCOUNTER — Encounter: Payer: Self-pay | Admitting: *Deleted

## 2015-08-03 NOTE — Telephone Encounter (Signed)
Pt would like to speak with Dr. Jeanice Lim regarding her son, Robin Delgado. Please call when you can. [phone]

## 2015-08-03 NOTE — Telephone Encounter (Signed)
Please see patient son chart for further documentation.

## 2015-08-03 NOTE — Telephone Encounter (Signed)
This encounter was created in error - please disregard.

## 2015-09-21 ENCOUNTER — Ambulatory Visit (INDEPENDENT_AMBULATORY_CARE_PROVIDER_SITE_OTHER): Payer: BLUE CROSS/BLUE SHIELD | Admitting: Family Medicine

## 2015-09-21 ENCOUNTER — Encounter: Payer: Self-pay | Admitting: Family Medicine

## 2015-09-21 VITALS — BP 128/74 | HR 72 | Temp 98.2°F | Resp 14 | Ht 63.0 in | Wt 152.0 lb

## 2015-09-21 DIAGNOSIS — Z0000 Encounter for general adult medical examination without abnormal findings: Secondary | ICD-10-CM

## 2015-09-21 DIAGNOSIS — F3130 Bipolar disorder, current episode depressed, mild or moderate severity, unspecified: Secondary | ICD-10-CM | POA: Diagnosis not present

## 2015-09-21 DIAGNOSIS — D509 Iron deficiency anemia, unspecified: Secondary | ICD-10-CM | POA: Diagnosis not present

## 2015-09-21 LAB — COMPREHENSIVE METABOLIC PANEL
ALT: 12 U/L (ref 6–29)
AST: 15 U/L (ref 10–30)
Albumin: 4 g/dL (ref 3.6–5.1)
Alkaline Phosphatase: 107 U/L (ref 33–115)
BUN: 9 mg/dL (ref 7–25)
CO2: 25 mmol/L (ref 20–31)
Calcium: 9.1 mg/dL (ref 8.6–10.2)
Chloride: 102 mmol/L (ref 98–110)
Creat: 0.75 mg/dL (ref 0.50–1.10)
Glucose, Bld: 70 mg/dL (ref 70–99)
Potassium: 3.6 mmol/L (ref 3.5–5.3)
Sodium: 139 mmol/L (ref 135–146)
Total Bilirubin: 0.3 mg/dL (ref 0.2–1.2)
Total Protein: 7.6 g/dL (ref 6.1–8.1)

## 2015-09-21 LAB — CBC WITH DIFFERENTIAL/PLATELET
Basophils Absolute: 0 10*3/uL (ref 0.0–0.1)
Basophils Relative: 0 % (ref 0–1)
Eosinophils Absolute: 0.1 10*3/uL (ref 0.0–0.7)
Eosinophils Relative: 1 % (ref 0–5)
HCT: 37.7 % (ref 36.0–46.0)
Hemoglobin: 11.9 g/dL — ABNORMAL LOW (ref 12.0–15.0)
Lymphocytes Relative: 29 % (ref 12–46)
Lymphs Abs: 3.1 10*3/uL (ref 0.7–4.0)
MCH: 22.9 pg — ABNORMAL LOW (ref 26.0–34.0)
MCHC: 31.6 g/dL (ref 30.0–36.0)
MCV: 72.6 fL — ABNORMAL LOW (ref 78.0–100.0)
MPV: 8.9 fL (ref 8.6–12.4)
Monocytes Absolute: 0.3 10*3/uL (ref 0.1–1.0)
Monocytes Relative: 3 % (ref 3–12)
Neutro Abs: 7.2 10*3/uL (ref 1.7–7.7)
Neutrophils Relative %: 67 % (ref 43–77)
Platelets: 368 10*3/uL (ref 150–400)
RBC: 5.19 MIL/uL — ABNORMAL HIGH (ref 3.87–5.11)
RDW: 15.2 % (ref 11.5–15.5)
WBC: 10.7 10*3/uL — ABNORMAL HIGH (ref 4.0–10.5)

## 2015-09-21 LAB — LIPID PANEL
Cholesterol: 162 mg/dL (ref 125–200)
HDL: 60 mg/dL (ref >=46)
LDL Cholesterol: 84 mg/dL (ref <130)
Total CHOL/HDL Ratio: 2.7 Ratio (ref <=5.0)
Triglycerides: 88 mg/dL (ref <150)
VLDL: 18 mg/dL (ref <30)

## 2015-09-21 LAB — IRON: Iron: 54 ug/dL (ref 40–190)

## 2015-09-21 NOTE — Patient Instructions (Addendum)
[facility]  I'm okay with your stopping the Remeron  We will call with lab results  F/U 4 months

## 2015-09-21 NOTE — Progress Notes (Signed)
Patient ID: Robin Delgado, female   DOB: [birth year], 35 y.o.   MRN: [id number]    Subjective:    Patient ID: Robin Delgado, female    DOB: 12-Oct-1980, 35 y.o.   MRN: [id number]  Patient presents for CPE Is here for complete physical exam and fasting labs. She has a GYN her Pap smears up-to-date. She has no specific concerns today. She is still taking Remeron randomly. She has gained significant weight with this which she did like and it also helps her sleep but I'm concerned about the weight gain. She has underlying bipolar disorder which needs to be treated but she has been reluctant to do so. She did have one visit with the psychiatrist and a therapist and now refuses to go back. She states that since her children now being treated at [facility] the families getting therapy and they advised her that she could have services there as well. She states that she is going this afternoon for an appointment.  Declines flu shot, TDAP UTD   She has history of heart deficiency anemia she has not been taking any vitamins or iron supplements.  Review Of Systems:  GEN- denies fatigue, fever, weight loss,weakness, recent illness HEENT- denies eye drainage, change in vision, nasal discharge, CVS- denies chest pain, palpitations RESP- denies SOB, cough, wheeze ABD- denies N/V, change in stools, abd pain GU- denies dysuria, hematuria, dribbling, incontinence MSK- denies joint pain, muscle aches, injury Neuro- denies headache, dizziness, syncope, seizure activity       Objective:    BP 128/74 mmHg  Pulse 72  Temp(Src) 98.2 F (36.8 C) (Oral)  Resp 14  Ht '5\' 3"'$  (1.6 m)  Wt 152 lb (68.947 kg)  BMI 26.93 kg/m2  LMP 09/06/2015 (Approximate) GEN- NAD, alert and oriented x3 HEENT- PERRL, EOMI, non injected sclera, pink conjunctiva, MMM, oropharynx clear Neck- Supple, no thyromegaly CVS- RRR, no murmur RESP-CTAB ABD-NABS,soft,NT,ND Psych- normal affect and mood  EXT- No  edema Pulses- Radial, DP- 2+        Assessment & Plan:      Problem List Items Addressed This Visit    Bipolar I disorder, most recent episode depressed ([place])    Untreated bipolar, recommend she discontinue remeron, she is not taking regulary anyway but concerned about further weight gain. She will see [facility] today       Anemia, iron deficiency   Relevant Orders   Iron (Completed)    Other Visit Diagnoses    Routine general medical examination at a health care facility    -  Primary    CPE done, discussed need for psychiatry F/U, fasting labs, reviewed GYN notes.    Relevant Orders    CBC with Differential/Platelet (Completed)    Comprehensive metabolic panel (Completed)    Lipid panel (Completed)       Note: This dictation was prepared with Dragon dictation along with smaller phrase technology. Any transcriptional errors that result from this process are unintentional.

## 2015-09-22 ENCOUNTER — Encounter: Payer: Self-pay | Admitting: Family Medicine

## 2015-09-22 NOTE — Assessment & Plan Note (Signed)
Untreated bipolar, recommend she discontinue remeron, she is not taking regulary anyway but concerned about further weight gain. [facility]

## 2015-10-07 ENCOUNTER — Other Ambulatory Visit: Payer: Self-pay | Admitting: Family Medicine

## 2015-10-07 NOTE — Telephone Encounter (Signed)
Refill appropriate and filled per protocol. 

## 2015-12-20 ENCOUNTER — Other Ambulatory Visit: Payer: Self-pay | Admitting: Family Medicine

## 2015-12-21 NOTE — Telephone Encounter (Signed)
Refill appropriate and filled per protocol. 

## 2016-01-31 ENCOUNTER — Ambulatory Visit (INDEPENDENT_AMBULATORY_CARE_PROVIDER_SITE_OTHER): Payer: BLUE CROSS/BLUE SHIELD | Admitting: Family Medicine

## 2016-01-31 ENCOUNTER — Encounter: Payer: Self-pay | Admitting: Family Medicine

## 2016-01-31 VITALS — BP 124/68 | HR 76 | Temp 98.6°F | Resp 18 | Ht 63.0 in | Wt 145.0 lb

## 2016-01-31 DIAGNOSIS — N390 Urinary tract infection, site not specified: Secondary | ICD-10-CM | POA: Diagnosis not present

## 2016-01-31 DIAGNOSIS — F332 Major depressive disorder, recurrent severe without psychotic features: Secondary | ICD-10-CM | POA: Diagnosis not present

## 2016-01-31 DIAGNOSIS — F329 Major depressive disorder, single episode, unspecified: Secondary | ICD-10-CM | POA: Insufficient documentation

## 2016-01-31 DIAGNOSIS — F411 Generalized anxiety disorder: Secondary | ICD-10-CM

## 2016-01-31 LAB — URINALYSIS, ROUTINE W REFLEX MICROSCOPIC
Bilirubin Urine: NEGATIVE
Glucose, UA: NEGATIVE
Ketones, ur: NEGATIVE
Nitrite: POSITIVE — AB
Specific Gravity, Urine: 1.015 (ref 1.001–1.035)
pH: 7.5 (ref 5.0–8.0)

## 2016-01-31 LAB — URINALYSIS, MICROSCOPIC ONLY
Casts: NONE SEEN [LPF]
Crystals: NONE SEEN [HPF]
Yeast: NONE SEEN [HPF]

## 2016-01-31 MED ORDER — VENLAFAXINE HCL ER 37.5 MG PO CP24: 37.5000 mg | ORAL_CAPSULE | Freq: Every day | ORAL | 2 refills | Status: DC

## 2016-01-31 MED ORDER — CIPROFLOXACIN HCL 500 MG PO TABS: 500.0000 mg | ORAL_TABLET | Freq: Two times a day (BID) | ORAL | 0 refills | Status: DC

## 2016-01-31 NOTE — Addendum Note (Signed)
Addended by: Milinda Antis F on: 01/31/2016 05:34 PM   Modules accepted: Orders

## 2016-01-31 NOTE — Assessment & Plan Note (Signed)
Previous assessment by psychiatry noted bipolar 1 disorder however she states her current psychiatrist has not diagnosed her with this states she is has depression with anxiety. She did not notice much difference with the BuSpar that this was a low dose but would not help with the depression symptoms.  Start her on Effexor 37.5 mg once a day. Hopefully she will take the medication consistently. She will see her therapist this afternoon [place]. She cannot afford to go to but the therapist and a psychiatrist because of the co-pays will have her follow back up in 6-8 weeks and see how she is doing with the medication

## 2016-01-31 NOTE — Progress Notes (Signed)
Subjective:    Patient ID: Robin Delgado, female    DOB: 06-Mar-1981, 35 y.o.   MRN: [id number]  Patient presents for 4 month F/U (is not fasting) Patient here to follow-up. She's history of anxiety depression diagnosed with bipolar by psychiatrist a few years ago. I recently took her off her Remeron as her meat was still uncontrolled but she was gaining too much weight. 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 She is contemplating going back to nursing school but stressed out about who will watch her children at various hours and she also needs to work. She knows that she is depressed as well as anxious and is willing to go on medication. She cannot afford to see both her psychiatrist in her therapist. Of note her children are also seen a therapist. She will see Judeth Cornfield this afternoon from [facility] and her children will see Vikki Ports [phone]  At the end of the visit she states that she's been having a strong odor to her urine denies any burning sensation denies any urinary frequency  Review Of Systems:  GEN- denies fatigue, fever, weight loss,weakness, recent illness HEENT- denies eye drainage, change in vision, nasal discharge, CVS- denies chest pain, palpitations RESP- denies SOB, cough, wheeze ABD- denies N/V, change in stools, abd pain GU- denies dysuria, hematuria, dribbling, incontinence MSK- denies joint pain, muscle aches, injury Neuro- denies headache, dizziness, syncope, seizure activity       Objective:    BP 124/68 (BP Location: Left Arm, Patient Position: Sitting, Cuff Size: Normal)   Pulse 76   Temp 98.6 F (37 C) (Oral)   Resp 18   Ht 5\' 3"  (1.6 m)   Wt 145 lb (65.8 kg)   LMP 01/17/2016 (Approximate) Comment: regular  BMI 25.69 kg/m  GEN- NAD, alert and oriented x3 Psych- stressed appearing, not overly anxious, good eye contact, no SI, well  groomed        Assessment & Plan:    Approx 20 minutes spent with pt > 50% on counseling and medications   Problem List Items Addressed This Visit    MDD (major depressive disorder) (HCC)    Previous assessment by psychiatry noted bipolar 1 disorder however she states her current psychiatrist has not diagnosed her with this states she is has depression with anxiety. She did not notice much difference with the BuSpar that this was a low dose but would not help with the depression symptoms.  Start her on Effexor 37.5 mg once a day. Hopefully she will take the medication consistently. She will see her therapist this afternoon [place]. She cannot afford to go to but the therapist and a psychiatrist because of the co-pays will have her follow back up in 6-8 weeks and see how she is doing with the medication      Relevant Medications   venlafaxine XR (EFFEXOR XR) 37.5 MG 24 hr capsule   Generalized anxiety disorder - Primary (Chronic)    Other Visit Diagnoses    UTI (lower urinary tract infection)       Send for culture, start Cipro BID    Relevant Orders   Urinalysis, Routine w reflex microscopic (not at [facility]) (Completed)   Urine culture      Note: This dictation was prepared with Dragon dictation along with smaller phrase technology. Any transcriptional errors that result from this  process are unintentional.

## 2016-01-31 NOTE — Patient Instructions (Signed)
Continue the effexor  F/U 8 weeks

## 2016-02-02 LAB — URINE CULTURE: Colony Count: >=100000

## 2016-02-09 ENCOUNTER — Telehealth: Payer: Self-pay | Admitting: Family Medicine

## 2016-02-09 NOTE — Telephone Encounter (Signed)
Patient is calling to say that the cipro is making her sick, please call and advise  [phone]

## 2016-02-09 NOTE — Telephone Encounter (Signed)
Call placed to patient.   Reports that she took cipro x2 doses on Friday, 02/03/2016. Reports that she had upset stomach with burning. States that she did eat before taking ABTx.   Reports that she stopped taking ABTx after x2 doses.   MD please advise.

## 2016-02-10 MED ORDER — SULFAMETHOXAZOLE-TRIMETHOPRIM 800-160 MG PO TABS: 1.0000 | ORAL_TABLET | Freq: Two times a day (BID) | ORAL | 0 refills | Status: DC

## 2016-02-10 NOTE — Telephone Encounter (Signed)
Call placed to patient and patient made aware.   Prescription sent to pharmacy.  

## 2016-02-10 NOTE — Telephone Encounter (Signed)
Change to bactrim DS 1 po BID with food for 5 days

## 2016-02-29 ENCOUNTER — Telehealth: Payer: Self-pay | Admitting: Family Medicine

## 2016-02-29 NOTE — Telephone Encounter (Signed)
Ms. Robin Delgado called asking if we'd received paperwork for her to receive counseling. I asked Robin Delgado and Robin Delgado and neither of them were familiar with having received the paperwork. I asked Robin Delgado if she could have the facility re-fax the paperwork to Robin Delgado. Please give her a phone call regarding this if necessary.  Pt's ph# [phone] Thank you.

## 2016-03-20 ENCOUNTER — Telehealth: Payer: Self-pay | Admitting: Family Medicine

## 2016-03-20 DIAGNOSIS — R300 Dysuria: Secondary | ICD-10-CM

## 2016-03-20 NOTE — Telephone Encounter (Signed)
Patient here with children, has has some odor to urine, mild dysuria Left urine sample

## 2016-03-23 ENCOUNTER — Ambulatory Visit (INDEPENDENT_AMBULATORY_CARE_PROVIDER_SITE_OTHER): Payer: BLUE CROSS/BLUE SHIELD | Admitting: Family Medicine

## 2016-03-23 ENCOUNTER — Encounter: Payer: Self-pay | Admitting: Family Medicine

## 2016-03-23 VITALS — BP 132/76 | HR 82 | Temp 98.2°F | Resp 14 | Ht 63.0 in | Wt 140.0 lb

## 2016-03-23 DIAGNOSIS — N390 Urinary tract infection, site not specified: Secondary | ICD-10-CM | POA: Diagnosis not present

## 2016-03-23 DIAGNOSIS — F332 Major depressive disorder, recurrent severe without psychotic features: Secondary | ICD-10-CM

## 2016-03-23 DIAGNOSIS — D509 Iron deficiency anemia, unspecified: Secondary | ICD-10-CM

## 2016-03-23 DIAGNOSIS — Z1321 Encounter for screening for nutritional disorder: Secondary | ICD-10-CM | POA: Diagnosis not present

## 2016-03-23 DIAGNOSIS — F411 Generalized anxiety disorder: Secondary | ICD-10-CM | POA: Diagnosis not present

## 2016-03-23 LAB — URINALYSIS, ROUTINE W REFLEX MICROSCOPIC
Bilirubin Urine: NEGATIVE
Glucose, UA: NEGATIVE
Ketones, ur: NEGATIVE
Nitrite: POSITIVE — AB
Specific Gravity, Urine: 1.02 (ref 1.001–1.035)
pH: 6.5 (ref 5.0–8.0)

## 2016-03-23 LAB — URINALYSIS, MICROSCOPIC ONLY
Casts: NONE SEEN [LPF]
Crystals: NONE SEEN [HPF]
Yeast: NONE SEEN [HPF]

## 2016-03-23 MED ORDER — VENLAFAXINE HCL ER 75 MG PO CP24: 75.0000 mg | ORAL_CAPSULE | Freq: Every day | ORAL | 3 refills | Status: DC

## 2016-03-23 MED ORDER — CEFTRIAXONE SODIUM 500 MG IJ SOLR
500.0000 mg | Freq: Once | INTRAMUSCULAR | Status: AC
  Administered 2016-03-23: 500 mg via INTRAMUSCULAR

## 2016-03-23 NOTE — Progress Notes (Signed)
Subjective:    Patient ID: Robin Delgado, female    DOB: 11-Aug-1980, 35 y.o.   MRN: [id number]  Patient presents for Follow-up (anxienty) and Dysuria   Patient here to follow-up medications. At her last visit in July she was started on Effexor 37.5 mg once a day for her major depression and anxiety. She states she cannot afford going to therapy or the psychiatrist because of her finances. In the past she was on BuSpar she's also been on mirtazapine beginning to much weight on this. She was tried on a few other medications but she often would stop them or not take them consistently.She took Effexor but did not see much improvement she ran out yesterday she thinks she needs a higher dose or something else out with her nerves.  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  Review Of Systems:  GEN- + fatigue, fever, weight loss,weakness, recent illness HEENT- denies eye drainage, change in vision, nasal discharge, CVS- denies chest pain, palpitations RESP- denies SOB, cough, wheeze ABD- denies N/V, change in stools, abd pain GU- +dysuria, hematuria, dribbling, incontinence MSK- denies joint pain, muscle aches, injury Neuro- denies headache, dizziness, syncope, seizure activity       Objective:    BP 132/76 (BP Location: Left Arm, Patient Position: Sitting, Cuff Size: Normal)   Pulse 82   Temp 98.2 F (36.8 C) (Oral)   Resp 14   Ht '5\' 3"'$  (1.6 m)   Wt 140 lb (63.5 kg)   BMI 24.80 kg/m  GEN- NAD, alert and oriented x3 HEENT- PERRL, EOMI, non injected sclera, pink conjunctiva, MMM, oropharynx clear,nasal congestion Neck- Supple, no thyromegaly, no LAD CVS- RRR, no  murmur RESP-CTAB ABD-NABS,soft,NT,ND, no CVA tenderness  Psych- sleepy appearing, not depressed, not anxious appearing, no SI Pulses- Radial - 2+        Assessment & Plan:      Problem List Items Addressed This Visit    MDD (major depressive disorder) (HCC)    Is significant stress in her life and is a single parent and now in school she is not managing her schedule very well. We discussed ways to improve her productivity on the hours that she has. I will increase her Effexor to 75 mg daily as well. For her fatigue having this is multifactorial but she does have history of anemia therefore we'll check her hemoglobin. We will also check a vitamin D request.  Recurring urinary tract infection. She's been holding her bladder during her job. Discussed getting up he urinating regularly also to increase her water intake. I've given her Rocephin injection 500 mg she is often very weary of taking antibiotics completely. We'll also give her Bactrim which she did tolerate a few months ago.      Relevant Medications   venlafaxine XR (EFFEXOR-XR) 75 MG 24 hr capsule   Generalized anxiety disorder - Primary (Chronic)   Anemia, iron deficiency   Relevant Orders   CBC with Differential/Platelet   Basic metabolic panel   Iron    Other Visit Diagnoses    UTI (lower urinary tract infection)       Relevant Orders   Urinalysis, Routine w  reflex microscopic (not at [facility]) (Completed)   Urine culture   Basic metabolic panel   Encounter for vitamin deficiency screening       Relevant Orders   Vitamin D, 25-hydroxy      Note: This dictation was prepared with Dragon dictation along with smaller phrase technology. Any transcriptional errors that result from this process are unintentional.

## 2016-03-23 NOTE — Patient Instructions (Addendum)
Take effexor 75mg  once a day  Rocephin injection Take bactrim  F/U 3 months

## 2016-03-23 NOTE — Assessment & Plan Note (Signed)
Is significant stress in her life and is a single parent and now in school she is not managing her schedule very well. We discussed ways to improve her productivity on the hours that she has. I will increase her Effexor to 75 mg daily as well. For her fatigue having this is multifactorial but she does have history of anemia therefore we'll check her hemoglobin. We will also check a vitamin D request.  Recurring urinary tract infection. She's been holding her bladder during her job. Discussed getting up he urinating regularly also to increase her water intake. I've given her Rocephin injection 500 mg she is often very weary of taking antibiotics completely. We'll also give her Bactrim which she did tolerate a few months ago.

## 2016-03-24 LAB — CBC WITH DIFFERENTIAL/PLATELET
Basophils Absolute: 0 cells/uL (ref 0–200)
Basophils Relative: 0 %
Eosinophils Absolute: 86 cells/uL (ref 15–500)
Eosinophils Relative: 1 %
HCT: 35.6 % (ref 35.0–45.0)
Hemoglobin: 11.2 g/dL — ABNORMAL LOW (ref 12.0–15.0)
Lymphocytes Relative: 32 %
Lymphs Abs: 2752 cells/uL (ref 850–3900)
MCH: 22.8 pg — ABNORMAL LOW (ref 27.0–33.0)
MCHC: 31.5 g/dL — ABNORMAL LOW (ref 32.0–36.0)
MCV: 72.5 fL — ABNORMAL LOW (ref 80.0–100.0)
MPV: 8.8 fL (ref 7.5–12.5)
Monocytes Absolute: 430 cells/uL (ref 200–950)
Monocytes Relative: 5 %
Neutro Abs: 5332 cells/uL (ref 1500–7800)
Neutrophils Relative %: 62 %
Platelets: 284 10*3/uL (ref 140–400)
RBC: 4.91 MIL/uL (ref 3.80–5.10)
RDW: 16 % — ABNORMAL HIGH (ref 11.0–15.0)
WBC: 8.6 10*3/uL (ref 3.8–10.8)

## 2016-03-24 LAB — BASIC METABOLIC PANEL
BUN: 11 mg/dL (ref 7–25)
CO2: 27 mmol/L (ref 20–31)
Calcium: 8.7 mg/dL (ref 8.6–10.2)
Chloride: 105 mmol/L (ref 98–110)
Creat: 0.81 mg/dL (ref 0.50–1.10)
Glucose, Bld: 76 mg/dL (ref 70–99)
Potassium: 3.9 mmol/L (ref 3.5–5.3)
Sodium: 139 mmol/L (ref 135–146)

## 2016-03-24 LAB — VITAMIN D 25 HYDROXY (VIT D DEFICIENCY, FRACTURES): Vit D, 25-Hydroxy: 24 ng/mL — ABNORMAL LOW (ref 30–100)

## 2016-03-24 LAB — IRON: Iron: 66 ug/dL (ref 40–190)

## 2016-03-26 LAB — URINE CULTURE

## 2016-04-02 ENCOUNTER — Ambulatory Visit: Payer: BLUE CROSS/BLUE SHIELD | Admitting: Family Medicine

## 2016-04-03 ENCOUNTER — Other Ambulatory Visit: Payer: Self-pay | Admitting: Family Medicine

## 2016-04-20 ENCOUNTER — Encounter: Payer: Self-pay | Admitting: Family Medicine

## 2016-04-20 ENCOUNTER — Ambulatory Visit (INDEPENDENT_AMBULATORY_CARE_PROVIDER_SITE_OTHER): Payer: BLUE CROSS/BLUE SHIELD | Admitting: Family Medicine

## 2016-04-20 VITALS — BP 120/74 | HR 82 | Temp 97.8°F | Resp 14 | Ht 63.0 in | Wt 139.0 lb

## 2016-04-20 DIAGNOSIS — N3000 Acute cystitis without hematuria: Secondary | ICD-10-CM

## 2016-04-20 LAB — URINALYSIS, ROUTINE W REFLEX MICROSCOPIC
Bilirubin Urine: NEGATIVE
Glucose, UA: NEGATIVE
Nitrite: NEGATIVE
Specific Gravity, Urine: >1.03 (ref 1.001–1.035)
pH: 5.5 (ref 5.0–8.0)

## 2016-04-20 LAB — URINALYSIS, MICROSCOPIC ONLY
Casts: NONE SEEN [LPF]
Crystals: NONE SEEN [HPF]

## 2016-04-20 MED ORDER — SULFAMETHOXAZOLE-TRIMETHOPRIM 800-160 MG PO TABS: 1.0000 | ORAL_TABLET | Freq: Two times a day (BID) | ORAL | 0 refills | Status: DC

## 2016-04-20 NOTE — Progress Notes (Signed)
   Subjective:    Patient ID: Robin Delgado, female    DOB: 06/25/1981, 35 y.o.   MRN: [id number]  Patient presents for Dysuria (lower abd pain, urinary urgency, frequency)  Patient here with recurrent UTI symptoms. She was treated for Escherichia coli urinary tract infection with Rocephin injection in the office on 8/25 and she was also prescribed Bactrim but she did never received this.   She did not call back to let us know. States she was busy and felt better after the shot, Still having urgency and frequency , denies hematuria, denies vaginal discharge    Review Of Systems:  GEN- denies fatigue, fever, weight loss,weakness, recent illness HEENT- denies eye drainage, change in vision, nasal discharge, CVS- denies chest pain, palpitations RESP- denies SOB, cough, wheeze ABD- denies N/V, change in stools, abd pain GU- +dysuria, hematuria, dribbling, incontinence MSK- denies joint pain, muscle aches, injury Neuro- denies headache, dizziness, syncope, seizure activity       Objective:    BP 120/74 (BP Location: Left Arm, Patient Position: Sitting, Cuff Size: Normal)   Pulse 82   Temp 97.8 F (36.6 C) (Oral)   Resp 14   Ht 5\' 3"  (1.6 m)   Wt 139 lb (63 kg)   LMP 04/09/2016   BMI 24.62 kg/m  GEN- NAD, alert and oriented x3 CVS- RRR, no murmur RESP-CTAB ABD-NABS,soft,NT,ND, no CVA tenderness,          Assessment & Plan:      Problem List Items Addressed This Visit    None    Visit Diagnoses    Acute cystitis without hematuria    -  Primary   untreated E coli UTI, Bactrim DS script given to patient, start today, increase fluids    Relevant Orders   Urinalysis, Routine w reflex microscopic (not at [facility])      Note: This dictation was prepared with Dragon dictation along with smaller phrase technology. Any transcriptional errors that result from this process are unintentional.

## 2016-05-11 ENCOUNTER — Other Ambulatory Visit: Payer: Self-pay | Admitting: *Deleted

## 2016-05-11 MED ORDER — NORELGESTROMIN-ETH ESTRADIOL 150-35 MCG/24HR TD PTWK: MEDICATED_PATCH | TRANSDERMAL | 1 refills | Status: DC

## 2016-05-11 NOTE — Telephone Encounter (Signed)
Received fax requesting refill on Xulane Patch.   Refill appropriate and filled per protocol.

## 2016-06-01 ENCOUNTER — Other Ambulatory Visit: Payer: Self-pay | Admitting: Family Medicine

## 2016-08-07 ENCOUNTER — Encounter: Payer: Self-pay | Admitting: Family Medicine

## 2016-08-07 ENCOUNTER — Ambulatory Visit (INDEPENDENT_AMBULATORY_CARE_PROVIDER_SITE_OTHER): Payer: BLUE CROSS/BLUE SHIELD | Admitting: Family Medicine

## 2016-08-07 VITALS — BP 118/78 | HR 68 | Temp 98.4°F | Resp 16 | Ht 63.0 in | Wt 143.0 lb

## 2016-08-07 DIAGNOSIS — N939 Abnormal uterine and vaginal bleeding, unspecified: Secondary | ICD-10-CM

## 2016-08-07 DIAGNOSIS — N920 Excessive and frequent menstruation with regular cycle: Secondary | ICD-10-CM | POA: Diagnosis not present

## 2016-08-07 LAB — URINALYSIS, ROUTINE W REFLEX MICROSCOPIC
Bilirubin Urine: NEGATIVE
Glucose, UA: NEGATIVE
Nitrite: NEGATIVE
Specific Gravity, Urine: 1.02 (ref 1.001–1.035)
pH: 7 (ref 5.0–8.0)

## 2016-08-07 LAB — URINALYSIS, MICROSCOPIC ONLY
Casts: NONE SEEN [LPF]
Crystals: NONE SEEN [HPF]
Squamous Epithelial / HPF: NONE SEEN [HPF] (ref <=5)
Yeast: NONE SEEN [HPF]

## 2016-08-07 LAB — WET PREP FOR TRICH, YEAST, CLUE
Trich, Wet Prep: NONE SEEN
Yeast Wet Prep HPF POC: NONE SEEN

## 2016-08-07 NOTE — Patient Instructions (Signed)
F/U as needed

## 2016-08-07 NOTE — Progress Notes (Signed)
   Subjective:    Patient ID: Robin Delgado, female    DOB: 03/09/1981, 36 y.o.   MRN: [id number]  Patient presents for Vaginal Irritation (irritation to L side of vulva and spotting )  Pt here with pressure inner vagina  Has also had some spotting Sunday night after wiping, still has some has some spotting has not, no discharge,, no burning with urination  LMP- ended last Friday  Currently on birth control patch- changed last week  PAP Smear UTD    Review Of Systems:  GEN- denies fatigue, fever, weight loss,weakness, recent illness HEENT- denies eye drainage, change in vision, nasal discharge, CVS- denies chest pain, palpitations RESP- denies SOB, cough, wheeze ABD- denies N/V, change in stools, abd pain GU- denies dysuria, hematuria, dribbling, incontinence MSK- denies joint pain, muscle aches, injury Neuro- denies headache, dizziness, syncope, seizure activity       Objective:    BP 118/78 (BP Location: Left Arm, Patient Position: Sitting, Cuff Size: Normal)   Pulse 68   Temp 98.4 F (36.9 C) (Oral)   Resp 16   Ht 5\' 3"  (1.6 m)   Wt 143 lb (64.9 kg)   LMP 07/28/2016   SpO2 99%   BMI 25.33 kg/m  GEN- NAD, alert and oriented x3 ABD-NABS,soft,NT,ND, no CVA tenderness  GU- normal external genitalia, vaginal mucosa pink and moist, cervix visualized no growth, no blood form os, minimal thin clear discharge, no CMT, no ovarian masses, uterus normal size         Assessment & Plan:      Problem List Items Addressed This Visit    None    Visit Diagnoses    Vaginal spotting    -  Primary   unknown cause may be more with hormones causing spotting, wet prep fairly unremarkable, UA done, cultures pending treat pending results, no abdominal exam   Relevant Orders   Urinalysis, Routine w reflex microscopic (Completed)   GC/Chlamydia Probe Amp (Completed)   WET PREP FOR TRICH, YEAST, CLUE (Completed)   Urine culture      Note: This dictation was prepared with  Dragon dictation along with smaller phrase technology. Any transcriptional errors that result from this process are unintentional.

## 2016-08-08 ENCOUNTER — Encounter: Payer: Self-pay | Admitting: Family Medicine

## 2016-08-08 LAB — GC/CHLAMYDIA PROBE AMP
CT Probe RNA: NOT DETECTED
GC Probe RNA: NOT DETECTED

## 2016-08-09 LAB — URINE CULTURE: Organism ID, Bacteria: NO GROWTH

## 2016-09-21 ENCOUNTER — Other Ambulatory Visit: Payer: Self-pay | Admitting: Family Medicine

## 2016-09-21 ENCOUNTER — Other Ambulatory Visit: Payer: BLUE CROSS/BLUE SHIELD

## 2016-09-21 DIAGNOSIS — Z0000 Encounter for general adult medical examination without abnormal findings: Secondary | ICD-10-CM

## 2016-09-21 DIAGNOSIS — F411 Generalized anxiety disorder: Secondary | ICD-10-CM

## 2016-09-21 DIAGNOSIS — D509 Iron deficiency anemia, unspecified: Secondary | ICD-10-CM

## 2016-09-21 DIAGNOSIS — Z79899 Other long term (current) drug therapy: Secondary | ICD-10-CM

## 2016-09-21 LAB — COMPLETE METABOLIC PANEL WITH GFR
ALT: 11 U/L (ref 6–29)
AST: 14 U/L (ref 10–30)
Albumin: 3.4 g/dL — ABNORMAL LOW (ref 3.6–5.1)
Alkaline Phosphatase: 84 U/L (ref 33–115)
BUN: 11 mg/dL (ref 7–25)
CO2: 23 mmol/L (ref 20–31)
Calcium: 8.6 mg/dL (ref 8.6–10.2)
Chloride: 106 mmol/L (ref 98–110)
Creat: 0.66 mg/dL (ref 0.50–1.10)
GFR, Est African American: >89 mL/min (ref >=60)
GFR, Est Non African American: >89 mL/min (ref >=60)
Glucose, Bld: 122 mg/dL — ABNORMAL HIGH (ref 70–99)
Potassium: 3.9 mmol/L (ref 3.5–5.3)
Sodium: 139 mmol/L (ref 135–146)
Total Bilirubin: 0.4 mg/dL (ref 0.2–1.2)
Total Protein: 7.1 g/dL (ref 6.1–8.1)

## 2016-09-21 LAB — CBC WITH DIFFERENTIAL/PLATELET
Basophils Absolute: 0 cells/uL (ref 0–200)
Basophils Relative: 0 %
Eosinophils Absolute: 91 cells/uL (ref 15–500)
Eosinophils Relative: 1 %
HCT: 34.8 % — ABNORMAL LOW (ref 35.0–45.0)
Hemoglobin: 11.1 g/dL — ABNORMAL LOW (ref 12.0–15.0)
Lymphocytes Relative: 28 %
Lymphs Abs: 2548 cells/uL (ref 850–3900)
MCH: 23.1 pg — ABNORMAL LOW (ref 27.0–33.0)
MCHC: 31.9 g/dL — ABNORMAL LOW (ref 32.0–36.0)
MCV: 72.5 fL — ABNORMAL LOW (ref 80.0–100.0)
MPV: 8.9 fL (ref 7.5–12.5)
Monocytes Absolute: 364 cells/uL (ref 200–950)
Monocytes Relative: 4 %
Neutro Abs: 6097 cells/uL (ref 1500–7800)
Neutrophils Relative %: 67 %
Platelets: 299 10*3/uL (ref 140–400)
RBC: 4.8 MIL/uL (ref 3.80–5.10)
RDW: 15.8 % — ABNORMAL HIGH (ref 11.0–15.0)
WBC: 9.1 10*3/uL (ref 3.8–10.8)

## 2016-09-21 LAB — LIPID PANEL
Cholesterol: 161 mg/dL (ref <200)
HDL: 68 mg/dL (ref >50)
LDL Cholesterol: 78 mg/dL (ref <100)
Total CHOL/HDL Ratio: 2.4 Ratio (ref <5.0)
Triglycerides: 77 mg/dL (ref <150)
VLDL: 15 mg/dL (ref <30)

## 2016-09-22 LAB — TSH: TSH: 1.21 mIU/L

## 2016-09-24 LAB — IRON AND TIBC
%SAT: 21 % (ref 11–50)
Iron: 82 ug/dL (ref 40–190)
TIBC: 383 ug/dL (ref 250–450)
UIBC: 301 ug/dL (ref 125–400)

## 2016-09-25 ENCOUNTER — Ambulatory Visit (INDEPENDENT_AMBULATORY_CARE_PROVIDER_SITE_OTHER): Payer: BLUE CROSS/BLUE SHIELD | Admitting: Family Medicine

## 2016-09-25 ENCOUNTER — Encounter: Payer: Self-pay | Admitting: Family Medicine

## 2016-09-25 VITALS — BP 128/68 | HR 74 | Temp 98.3°F | Resp 14 | Ht 63.0 in | Wt 146.0 lb

## 2016-09-25 DIAGNOSIS — F332 Major depressive disorder, recurrent severe without psychotic features: Secondary | ICD-10-CM | POA: Diagnosis not present

## 2016-09-25 DIAGNOSIS — D508 Other iron deficiency anemias: Secondary | ICD-10-CM | POA: Diagnosis not present

## 2016-09-25 DIAGNOSIS — Z0000 Encounter for general adult medical examination without abnormal findings: Secondary | ICD-10-CM

## 2016-09-25 LAB — HEMOGLOBIN A1C
Hgb A1c MFr Bld: 5.6 % (ref <5.7)
Mean Plasma Glucose: 114 mg/dL

## 2016-09-25 MED ORDER — MIRTAZAPINE 15 MG PO TABS: 15.0000 mg | ORAL_TABLET | Freq: Every day | ORAL | 2 refills | Status: DC

## 2016-09-25 MED ORDER — ESCITALOPRAM OXALATE 5 MG PO TABS: 5.0000 mg | ORAL_TABLET | Freq: Every day | ORAL | 0 refills | Status: DC

## 2016-09-25 NOTE — Patient Instructions (Addendum)
F/U  Schedule a PAP Smear within the next 1-2 months

## 2016-09-25 NOTE — Assessment & Plan Note (Signed)
Follow up with psychiatry they are managing now, at least she is consistently takingmeds  I am concerned about the remeron as she gained a lot of weight with this medication

## 2016-09-25 NOTE — Assessment & Plan Note (Signed)
Restart OTC iron tablet or MVI

## 2016-09-25 NOTE — Progress Notes (Signed)
   Subjective:    Patient ID: Robin Delgado, female    DOB: 07/02/1981, 36 y.o.   MRN: [id number]  Patient presents for CPE (is not fasting- defer PAP d/t cycle)  Patient here for complete physical exam. Her fasting labs were reviewed at bedside. History/family history reviewed  Her last Pap smear was 2 years ago however she did come in with vaginal spotting in January. She is currently on contraceptive patch. We will proceed with Pap smear as she's had some  irregular bleeding recently but currently on menses so pt defers today   Immunizations- UTD  She has chronic anemia she is supposed to be on iron supplementation but does not take regularly. Her hemoglobin has been stable at 11.1. She also elevated fasting glucose with A1c was normal.  [facility]. She is currently on Lexapro 5 mg as well as Remeron again for sleep she has a follow-up tomorrow with them. Review Of Systems:  GEN- denies fatigue, fever, weight loss,weakness, recent illness HEENT- denies eye drainage, change in vision, nasal discharge, CVS- denies chest pain, palpitations RESP- denies SOB, cough, wheeze ABD- denies N/V, change in stools, abd pain GU- denies dysuria, hematuria, dribbling, incontinence MSK- denies joint pain, muscle aches, injury Neuro- denies headache, dizziness, syncope, seizure activity       Objective:    BP 128/68   Pulse 74   Temp 98.3 F (36.8 C) (Oral)   Resp 14   Ht 5\' 3"  (1.6 m)   Wt 146 lb (66.2 kg)   LMP 09/23/2016   SpO2 98%   BMI 25.86 kg/m  GEN- NAD, alert and oriented x3 HEENT- PERRL, EOMI, non injected sclera, pink conjunctiva, MMM, oropharynx clear Neck- Supple, no thyromegaly CVS- RRR, no murmur RESP-CTAB GU- deferred Psych- normal affect and mood  ABD-NABS,soft,NT,ND EXT- No edema Pulses- Radial, DP- 2+        Assessment & Plan:      Problem List Items Addressed This Visit    MDD (major depressive disorder)    Follow up with psychiatry they are managing now, at least she is consistently takingmeds  I am concerned about the remeron as she gained a lot of weight with this medication      Relevant Medications   escitalopram (LEXAPRO) 5 MG tablet   mirtazapine (REMERON) 15 MG tablet   Anemia, iron deficiency    Restart OTC iron tablet or MVI       Other Visit Diagnoses    Routine general medical examination at a health care facility    -  Primary   CPE done, refturn for GYN exam, shots UTD, fasting labs reviewed at bedside       Note: This dictation was prepared with Dragon dictation along with smaller phrase technology. Any transcriptional errors that result from this process are unintentional.

## 2016-10-05 ENCOUNTER — Other Ambulatory Visit: Payer: Self-pay | Admitting: Family Medicine

## 2016-11-07 ENCOUNTER — Other Ambulatory Visit: Payer: BLUE CROSS/BLUE SHIELD | Admitting: Family Medicine

## 2016-11-14 ENCOUNTER — Encounter: Payer: Self-pay | Admitting: Advanced Practice Midwife

## 2016-11-14 ENCOUNTER — Ambulatory Visit (INDEPENDENT_AMBULATORY_CARE_PROVIDER_SITE_OTHER): Payer: BLUE CROSS/BLUE SHIELD | Admitting: Advanced Practice Midwife

## 2016-11-14 VITALS — BP 130/80 | HR 74 | Ht 64.0 in | Wt 153.0 lb

## 2016-11-14 DIAGNOSIS — N946 Dysmenorrhea, unspecified: Secondary | ICD-10-CM | POA: Diagnosis not present

## 2016-11-14 MED ORDER — NORELGESTROMIN-ETH ESTRADIOL 150-35 MCG/24HR TD PTWK: 1.0000 | MEDICATED_PATCH | TRANSDERMAL | 6 refills | Status: DC

## 2016-11-14 NOTE — Progress Notes (Signed)
[facility] Visit  Patient name: Robin Delgado MRN [id number]  Date of birth: [birth year]  CC & HPI:  Robin Delgado is a 36 y.o. African American female presenting today for ortho evra refill.  Had BTL, uses patch for "period management"  Last pap 4/16, normal.   Pertinent History Reviewed:  Medical & Surgical Hx:   Past Medical History:  Diagnosis Date  . Anemia   . CIN II (cervical intraepithelial neoplasia II) 2008   Colpo/Leep   . Depression   . STD (sexually transmitted disease)    Trichomonas,    Past Surgical History:  Procedure Laterality Date  . CHOLECYSTECTOMY N/A 09/25/2013   Procedure: LAPAROSCOPIC CHOLECYSTECTOMY;  Surgeon: Dalia Heading, MD;  Location: [facility]S;  Service: General;  Laterality: N/A;  . DILATION AND CURETTAGE OF UTERUS N/A 07/09/1998   and 02/07/2003  . ESOPHAGOGASTRODUODENOSCOPY N/A 09/15/2013   Procedure: ESOPHAGOGASTRODUODENOSCOPY (EGD);  Surgeon: West Bali, MD;  Location: [facility];  Service: Endoscopy;  Laterality: N/A;  11:30-moved to 1030 Leigh Ann notified pt  . LAPAROSCOPIC BILATERAL SALPINGECTOMY Bilateral 11/10/2014   Procedure: LAPAROSCOPIC BILATERAL SALPINGECTOMY;  Surgeon: Lazaro Arms, MD;  Location: [facility];  Service: Gynecology;  Laterality: Bilateral;  . TUBAL LIGATION     Family History  Problem Relation Age of Onset  . Hypertension Mother   . Alcohol abuse Mother   . Cancer Father     stomach   . Hypertension Brother   . Alcohol abuse Brother   . ADD / ADHD Son   . Anxiety disorder Brother   . Depression Brother   . Diabetes Maternal Grandmother   . Drug abuse Neg Hx   . Bipolar disorder Neg Hx   . Dementia Neg Hx   . OCD Neg Hx   . Paranoid behavior Neg Hx   . Schizophrenia Neg Hx   . Seizures Neg Hx   . Sexual abuse Neg Hx   . Physical abuse Neg Hx   . Colon cancer Neg Hx     Current Outpatient Prescriptions:  .  escitalopram (LEXAPRO) 5 MG tablet, Take 1 tablet (5 mg total) by mouth  daily., Disp: 30 tablet, Rfl: 0 .  mirtazapine (REMERON) 15 MG tablet, Take 1 tablet (15 mg total) by mouth at bedtime., Disp: 30 tablet, Rfl: 2 .  norelgestromin-ethinyl estradiol (XULANE) 150-35 MCG/24HR transdermal patch, Place 1 patch onto the skin once a week., Disp: 9 patch, Rfl: 6 Social History: Reviewed -  reports that she has never smoked. She has never used smokeless tobacco.  Review of Systems:   Constitutional: Negative for fever and chills Eyes: Negative for visual disturbances Respiratory: Negative for shortness of breath, dyspnea Cardiovascular: Negative for chest pain or palpitations  Gastrointestinal: Negative for vomiting, diarrhea and constipation; no abdominal pain Genitourinary: Negative for dysuria and urgency, vaginal irritation or itching Musculoskeletal: Negative for back pain, joint pain, myalgias  Neurological: Negative for dizziness and headaches    Objective Findings:    Physical Examination: General appearance - well appearing, and in no distress Mental status - alert, oriented to person, place, and time Chest:  Normal respiratory effort Heart - normal rate and regular rhythm Abdomen:  Soft, nontender Pelvic: deferred Musculoskeletal:  Normal range of motion without pain Extremities:  No edema    No results found for this or any previous visit (from the past 24 hour(s)).    Assessment & Plan:  A:   Dysmenorrhea management P:  Discussed continuous use of patches, not really interested.    Return in about 1 year (around 11/14/2017) for Pap&physical/LHE.  Robin Delgado CNM 11/14/2016 3:56 PM

## 2016-11-20 ENCOUNTER — Other Ambulatory Visit: Payer: Self-pay | Admitting: *Deleted

## 2016-11-20 MED ORDER — CLOTRIMAZOLE-BETAMETHASONE 1-0.05 % EX CREA: 1.0000 "application " | TOPICAL_CREAM | Freq: Two times a day (BID) | CUTANEOUS | 3 refills | Status: DC

## 2016-11-20 MED ORDER — CLINDAMYCIN PHOS-BENZOYL PEROX 1-5 % EX GEL: Freq: Two times a day (BID) | CUTANEOUS | 3 refills | Status: DC

## 2016-12-05 ENCOUNTER — Ambulatory Visit: Payer: BLUE CROSS/BLUE SHIELD | Admitting: Family Medicine

## 2016-12-11 ENCOUNTER — Encounter: Payer: Self-pay | Admitting: Family Medicine

## 2016-12-11 ENCOUNTER — Ambulatory Visit (INDEPENDENT_AMBULATORY_CARE_PROVIDER_SITE_OTHER): Payer: BLUE CROSS/BLUE SHIELD | Admitting: Family Medicine

## 2016-12-11 VITALS — BP 122/68 | HR 82 | Temp 98.3°F | Resp 12 | Ht 64.0 in | Wt 153.0 lb

## 2016-12-11 DIAGNOSIS — L298 Other pruritus: Secondary | ICD-10-CM | POA: Diagnosis not present

## 2016-12-11 DIAGNOSIS — N898 Other specified noninflammatory disorders of vagina: Secondary | ICD-10-CM

## 2016-12-11 LAB — WET PREP FOR TRICH, YEAST, CLUE
Trich, Wet Prep: NONE SEEN
Yeast Wet Prep HPF POC: NONE SEEN

## 2016-12-11 NOTE — Patient Instructions (Signed)
F/U as needed

## 2016-12-11 NOTE — Progress Notes (Signed)
   Subjective:    Patient ID: Robin Delgado, female    DOB: 06/19/1981, 36 y.o.   MRN: [id number]  Patient presents for Vaginal Itching (x1 week- states that itching has almost resolved- denies discharge)  Vaginal itching for past week, no significant discharge. Itching has improved. No new sexual partners, but wants to be checked. No change in soap detergent. Has Patch for contraception No UTI symptoms   Seeing therapist given wellbutrin/lexapro/ Focalin 10mg  as needed    Review Of Systems:  GEN- denies fatigue, fever, weight loss,weakness, recent illness HEENT- denies eye drainage, change in vision, nasal discharge, CVS- denies chest pain, palpitations RESP- denies SOB, cough, wheeze ABD- denies N/V, change in stools, abd pain GU- denies dysuria, hematuria, dribbling, incontinence MSK- denies joint pain, muscle aches, injury Neuro- denies headache, dizziness, syncope, seizure activity       Objective:    BP 122/68   Pulse 82   Temp 98.3 F (36.8 C) (Oral)   Resp 12   Ht 5\' 4"  (1.626 m)   Wt 153 lb (69.4 kg)   SpO2 98%   BMI 26.26 kg/m  GEN- NAD, alert and oriented x3 ABD-NABS,soft,NT,ND GU- normal external genitalia, vaginal mucosa pink and moist, cervix visualized no growth, no blood form os, + white  discharge, no CMT, no ovarian masses, uterus normal size Ext- no edema         Assessment & Plan:      Problem List Items Addressed This Visit    None    Visit Diagnoses    Vaginal itching    -  Primary   Wet prep farily benign, itching has resolved itself for the most part, await GC, treat as needed   Relevant Orders   WET PREP FOR TRICH, YEAST, CLUE   GC/Chlamydia Probe Amp      Note: This dictation was prepared with Dragon dictation along with smaller Lobbyistphrase technology. Any transcriptional errors that result from this process are unintentional.

## 2016-12-12 LAB — GC/CHLAMYDIA PROBE AMP
CT Probe RNA: NOT DETECTED
GC Probe RNA: NOT DETECTED

## 2017-02-27 ENCOUNTER — Encounter: Payer: Self-pay | Admitting: Family Medicine

## 2017-03-26 ENCOUNTER — Ambulatory Visit (INDEPENDENT_AMBULATORY_CARE_PROVIDER_SITE_OTHER): Payer: BLUE CROSS/BLUE SHIELD | Admitting: Women's Health

## 2017-03-26 ENCOUNTER — Encounter: Payer: Self-pay | Admitting: Women's Health

## 2017-03-26 VITALS — BP 118/70 | HR 79 | Ht 64.0 in | Wt 149.0 lb

## 2017-03-26 DIAGNOSIS — R102 Pelvic and perineal pain unspecified side: Secondary | ICD-10-CM

## 2017-03-26 DIAGNOSIS — R350 Frequency of micturition: Secondary | ICD-10-CM | POA: Diagnosis not present

## 2017-03-26 DIAGNOSIS — B9689 Other specified bacterial agents as the cause of diseases classified elsewhere: Secondary | ICD-10-CM

## 2017-03-26 DIAGNOSIS — N898 Other specified noninflammatory disorders of vagina: Secondary | ICD-10-CM | POA: Diagnosis not present

## 2017-03-26 DIAGNOSIS — N760 Acute vaginitis: Secondary | ICD-10-CM | POA: Diagnosis not present

## 2017-03-26 LAB — POCT WET PREP (WET MOUNT)
Clue Cells Wet Prep Whiff POC: POSITIVE
Trichomonas Wet Prep HPF POC: ABSENT

## 2017-03-26 LAB — POCT URINALYSIS DIPSTICK
Glucose, UA: NEGATIVE
Ketones, UA: NEGATIVE
Leukocytes, UA: NEGATIVE
Nitrite, UA: NEGATIVE
Protein, UA: NEGATIVE

## 2017-03-26 MED ORDER — METRONIDAZOLE 0.75 % VA GEL: 1.0000 | Freq: Every day | VAGINAL | 0 refills | Status: DC

## 2017-03-26 NOTE — Patient Instructions (Signed)
Increase water, decrease sodas/lemonade  Bacterial Vaginosis Bacterial vaginosis is a vaginal infection that occurs when the normal balance of bacteria in the vagina is disrupted. It results from an overgrowth of certain bacteria. This is the most common vaginal infection among women ages 76-44. Because bacterial vaginosis increases your risk for STIs (sexually transmitted infections), getting treated can help reduce your risk for chlamydia, gonorrhea, herpes, and HIV (human immunodeficiency virus). Treatment is also important for preventing complications in pregnant women, because this condition can cause an early (premature) delivery. What are the causes? This condition is caused by an increase in harmful bacteria that are normally present in small amounts in the vagina. However, the reason that the condition develops is not fully understood. What increases the risk? The following factors may make you more likely to develop this condition:  Having a new sexual partner or multiple sexual partners.  Having unprotected sex.  Douching.  Having an intrauterine device (IUD).  Smoking.  Drug and alcohol abuse.  Taking certain antibiotic medicines.  Being pregnant.  You cannot get bacterial vaginosis from toilet seats, bedding, swimming pools, or contact with objects around you. What are the signs or symptoms? Symptoms of this condition include:  Grey or white vaginal discharge. The discharge can also be watery or foamy.  A fish-like odor with discharge, especially after sexual intercourse or during menstruation.  Itching in and around the vagina.  Burning or pain with urination.  Some women with bacterial vaginosis have no signs or symptoms. How is this diagnosed? This condition is diagnosed based on:  Your medical history.  A physical exam of the vagina.  Testing a sample of vaginal fluid under a microscope to look for a large amount of bad bacteria or abnormal cells. Your  health care provider may use a cotton swab or a small wooden spatula to collect the sample.  How is this treated? This condition is treated with antibiotics. These may be given as a pill, a vaginal cream, or a medicine that is put into the vagina (suppository). If the condition comes back after treatment, a second round of antibiotics may be needed. Follow these instructions at home: Medicines  Take over-the-counter and prescription medicines only as told by your health care provider.  Take or use your antibiotic as told by your health care provider. Do not stop taking or using the antibiotic even if you start to feel better. General instructions  If you have a female sexual partner, tell her that you have a vaginal infection. She should see her health care provider and be treated if she has symptoms. If you have a female sexual partner, he does not need treatment.  During treatment: ? Avoid sexual activity until you finish treatment. ? Do not douche. ? Avoid alcohol as directed by your health care provider. ? Avoid breastfeeding as directed by your health care provider.  Drink enough water and fluids to keep your urine clear or pale yellow.  Keep the area around your vagina and rectum clean. ? Wash the area daily with warm water. ? Wipe yourself from front to back after using the toilet.  Keep all follow-up visits as told by your health care provider. This is important. How is this prevented?  Do not douche.  Wash the outside of your vagina with warm water only.  Use protection when having sex. This includes latex condoms and dental dams.  Limit how many sexual partners you have. To help prevent bacterial vaginosis, it is best  to have sex with just one partner (monogamous).  Make sure you and your sexual partner are tested for STIs.  Wear cotton or cotton-lined underwear.  Avoid wearing tight pants and pantyhose, especially during summer.  Limit the amount of alcohol that  you drink.  Do not use any products that contain nicotine or tobacco, such as cigarettes and e-cigarettes. If you need help quitting, ask your health care provider.  Do not use illegal drugs. Where to find more information:  Centers for Disease Control and Prevention: SolutionApps.co.za  American Sexual Health Association (ASHA): www.ashastd.org  U.S. Department of Health and Health and safety inspector, Office on Women's Health: ConventionalMedicines.si or http://www.anderson-williamson.info/ Contact a health care provider if:  Your symptoms do not improve, even after treatment.  You have more discharge or pain when urinating.  You have a fever.  You have pain in your abdomen.  You have pain during sex.  You have vaginal bleeding between periods. Summary  Bacterial vaginosis is a vaginal infection that occurs when the normal balance of bacteria in the vagina is disrupted.  Because bacterial vaginosis increases your risk for STIs (sexually transmitted infections), getting treated can help reduce your risk for chlamydia, gonorrhea, herpes, and HIV (human immunodeficiency virus). Treatment is also important for preventing complications in pregnant women, because the condition can cause an early (premature) delivery.  This condition is treated with antibiotic medicines. These may be given as a pill, a vaginal cream, or a medicine that is put into the vagina (suppository). This information is not intended to replace advice given to you by your health care provider. Make sure you discuss any questions you have with your health care provider. Document Released: 06/25/2005 Document Revised: 03/10/2016 Document Reviewed: 03/10/2016 Elsevier Interactive Patient Education  2017 [employer].

## 2017-03-26 NOTE — Progress Notes (Signed)
[facility] Visit  Patient name: Robin Delgado MRN [id number]  Date of birth: 1980-12-22 CC & HPI:  Robin Delgado is a 36 y.o. G3P3 African American female being seen today for report of urinary frequency x 2 days, has intermittent achy pain in LLQ only when she voids. Denies urgency, hesitancy, hematuria. Symptoms are not present when she drinks water, only when she drinks 'sodas' which she reports is MinuteMaid Lemonade. Also reports 'different' vaginal d/c for about 4 days. Denies itching/odor/irritation. Last sex ~38month ago. Does want gc/ct testing.   Patient's last menstrual period was 03/12/2017. The current method of family planning is tubal ligation and uses Ortho Evra for period management Last pap 2016 w/ PCP, normal  Review of Systems:   Patient denies any headaches, hearing loss, fatigue, blurred vision, shortness of breath, chest pain, problems with bowel movements or intercourse. No joint pain or mood swings.  Pertinent History Reviewed:  Reviewed past medical,surgical and family history.  Reviewed problem list, medications and allergies.  Objective Findings:   Vitals:   03/26/17 0846  BP: 118/70  Pulse: 79  Weight: 149 lb (67.6 kg)  Height: '5\' 4"'$  (1.626 m)    Body mass index is 25.58 kg/m.  Physical Examination: General appearance - well appearing, and in no distress Mental status - alert, oriented to person, place, and time Physical Examination: General appearance - alert, well appearing, and in no distress Pelvic - normal external genitalia, cx/vaginal walls w/ white malodorous d/c, gc/ct and wet prep collected Bimanual: 'pressure' when cx touched, uterus/adnexa without abnormalities  Results for orders placed or performed in visit on 03/26/17 (from the past 24 hour(s))  POCT urinalysis dipstick   Collection Time: 03/26/17  8:51 AM  Result Value Ref Range   Color, UA     Clarity, UA     Glucose, UA neg    Bilirubin, UA     Ketones, UA neg    Spec Grav, UA  1.010 - 1.025   Blood, UA trace    pH, UA  5.0 - 8.0   Protein, UA neg    Urobilinogen, UA  0.2 or 1.0 E.U./dL   Nitrite, UA neg    Leukocytes, UA Negative Negative  POCT Wet Prep ([facility])   Collection Time: 03/26/17  9:29 AM  Result Value Ref Range   Source Wet Prep POC vaginal    WBC, Wet Prep HPF POC few    Bacteria Wet Prep HPF POC None (A) Few   BACTERIA WET PREP MORPHOLOGY POC     Clue Cells Wet Prep HPF POC Many (A) None   Clue Cells Wet Prep Whiff POC Positive Whiff    Yeast Wet Prep HPF POC None    KOH Wet Prep POC     Trichomonas Wet Prep HPF POC Absent Absent     Assessment & Plan:   1) BV>rx metrogel q hs x 5nights per pt preference, no sex or etoh while using  2) Urinary frequency> urine cx sent today. Possibly bladder irritation from MinuteMaid Lemonade, to increase water- quit drinking lemonade for now 3) LLQ pain when voiding  4) STD screen> gc/ct sent  Orders Placed This Encounter  Procedures  . GC/Chlamydia Probe Amp  . Urine Culture  . POCT urinalysis dipstick  . POCT Wet Prep ([facility]   Discussed if sx not improved after finished metrogel and urine cx normal, [place] for prn. Pap in 2019, can be w/  [place] or pcp, wherever she prefers  Tawnya Crook CNM, [facility] 03/26/2017 9:32 AM

## 2017-03-28 ENCOUNTER — Encounter: Payer: Self-pay | Admitting: Family Medicine

## 2017-03-28 LAB — URINE CULTURE

## 2017-03-28 LAB — GC/CHLAMYDIA PROBE AMP
Chlamydia trachomatis, NAA: NEGATIVE
Neisseria gonorrhoeae by PCR: NEGATIVE

## 2017-05-13 ENCOUNTER — Encounter: Payer: Self-pay | Admitting: Family Medicine

## 2017-06-12 ENCOUNTER — Ambulatory Visit: Payer: BLUE CROSS/BLUE SHIELD | Admitting: Family Medicine

## 2017-06-12 ENCOUNTER — Other Ambulatory Visit: Payer: Self-pay

## 2017-06-12 ENCOUNTER — Encounter: Payer: Self-pay | Admitting: Family Medicine

## 2017-06-12 VITALS — BP 110/80 | HR 74 | Temp 98.6°F | Resp 16 | Ht 64.0 in | Wt 146.2 lb

## 2017-06-12 DIAGNOSIS — F39 Unspecified mood [affective] disorder: Secondary | ICD-10-CM | POA: Insufficient documentation

## 2017-06-12 DIAGNOSIS — L700 Acne vulgaris: Secondary | ICD-10-CM

## 2017-06-12 DIAGNOSIS — D649 Anemia, unspecified: Secondary | ICD-10-CM | POA: Diagnosis not present

## 2017-06-12 DIAGNOSIS — R5383 Other fatigue: Secondary | ICD-10-CM | POA: Diagnosis not present

## 2017-06-12 MED ORDER — CLINDAMYCIN PHOS-BENZOYL PEROX 1-5 % EX GEL: Freq: Two times a day (BID) | CUTANEOUS | 3 refills | Status: DC

## 2017-06-12 NOTE — Progress Notes (Signed)
Patient ID: Robin Delgado, female    DOB: [birth year], 36 y.o.   MRN: [id number]  No chief complaint on file.   Allergies Macrobid [nitrofurantoin macrocrystal]  Subjective:   Robin Delgado is a 36 y.o. female who presents to [facility] today.  HPI Here to establish care. Seen by psychiatrist at [facility]. Seen by [facility].  Reports she is transferring her care here but does not want to talk about why she is changing to our office.  Would like to know if I would refill her psychiatric medicines.  She is currently on Lexapro, Wellbutrin, Focalin, Xanax, and Remeron.  She has been followed by psychiatry at [facility] for quite some time.  She reports she is not always compliant with her medicine but does usually take her Xanax a few times a week.  Reports that she does not feel depressed but does have some anxiety.  She reports that she is just a naturally mean person and does not believe she needs all those medications.  Would also like a refill on her acne medication.  Reports she is use the BenzaClin for quite some time and it helps with the acne that she gets around her jawline.  Has had no side effects with the medication.  Has a history of anemia due to her menses.  Is currently on oral contraceptives to decrease the heaviness and length of her menses.  Reports that she has not been on iron pills in a long time and is interested in seeing if she is anemic.  She does eat meat fruit and vegetables.  Denies any shortness of breath, chest pain, palpitations, edema.  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  She reports that she works 7 days a week.  Has 3 children.  Denies any tobacco, alcohol, or drug use.  Patient reports that she often does not have time to relax or do things for herself because she is so busy with work.  Patient does not want to get a flu shot.  She reports she has had her hepatitis a and B vaccinations through the health  department.  Reports she does not like to take medications or swallow pills.  She reports she will consider taking her iron pills if they are very small round tablets.    Past Medical History:  Diagnosis Date  . Anemia   . CIN II (cervical intraepithelial neoplasia II) 2008   Colpo/Leep   . Depression   . STD (sexually transmitted disease)    Trichomonas,     Past Surgical History:  Procedure Laterality Date  . CHOLECYSTECTOMY N/A 09/25/2013   Procedure: LAPAROSCOPIC CHOLECYSTECTOMY;  Surgeon: Jamesetta So, MD;  Location: [facility] ORS;  Service: General;  Laterality: N/A;  . DILATION AND CURETTAGE OF UTERUS N/A 07/09/1998   and 02/07/2003  . ESOPHAGOGASTRODUODENOSCOPY N/A 09/15/2013   Procedure: ESOPHAGOGASTRODUODENOSCOPY (EGD);  Surgeon: Danie Binder, MD;  Location: [facility] ENDO SUITE;  Service: Endoscopy;  Laterality: N/A;  11:30-moved to [place] notified pt  . LAPAROSCOPIC BILATERAL SALPINGECTOMY Bilateral 11/10/2014   Procedure: LAPAROSCOPIC BILATERAL SALPINGECTOMY;  Surgeon: Florian Buff, MD;  Location: [facility] ORS;  Service: Gynecology;  Laterality: Bilateral;  . TUBAL LIGATION      Family History  Problem Relation Age of Onset  . Hypertension Mother   . Alcohol abuse Mother   . Stomach cancer Father        stomach   . Hypertension Brother   .  Alcohol abuse Brother   . ADD / ADHD Son   . Anxiety disorder Brother   . Depression Brother   . Diabetes Maternal Grandmother   . Drug abuse Neg Hx   . Bipolar disorder Neg Hx   . Dementia Neg Hx   . OCD Neg Hx   . Paranoid behavior Neg Hx   . Schizophrenia Neg Hx   . Seizures Neg Hx   . Sexual abuse Neg Hx   . Physical abuse Neg Hx   . Colon cancer Neg Hx      Social History   Socioeconomic History  . Marital status: Single    Spouse name: None  . Number of children: None  . Years of education: None  . Highest education level: None  Social Needs  . Financial resource strain: None  . Food insecurity - worry: None  . Food  insecurity - inability: None  . Transportation needs - medical: None  . Transportation needs - non-medical: None  Occupational History  . Occupation: Home Health  Tobacco Use  . Smoking status: Never Smoker  . Smokeless tobacco: Never Used  Substance and Sexual Activity  . Alcohol use: No    Frequency: Never    Comment: Drinks occasionally - glass of wine  . Drug use: No  . Sexual activity: Not Currently    Birth control/protection: Patch, Surgical  Other Topics Concern  . None  Social History Narrative   In school for medical office/nursing.    Live in [place].   Has 3 children, 17, 52, 11 ages.    No tobacco use.    Works 7 days a week.   Current Outpatient Medications on File Prior to Visit  Medication Sig Dispense Refill  . ALPRAZolam (XANAX) 0.5 MG tablet TAKE 1/2 TO 1 TABLET BY MOUTH DAILY AS NEEDED FOR ANXIETY (MUST LAST 30 DAYS)  2  . clotrimazole-betamethasone (LOTRISONE) cream Apply 1 application topically 2 (two) times daily. 30 g 3  . mirtazapine (REMERON) 15 MG tablet Take 1 tablet (15 mg total) by mouth at bedtime. 30 tablet 2  . norelgestromin-ethinyl estradiol Marilu Favre) 150-35 MCG/24HR transdermal patch Place 1 patch onto the skin once a week. 9 patch 6  . buPROPion (WELLBUTRIN SR) 150 MG 12 hr tablet Take 150 mg by mouth 2 (two) times daily.    Marland Kitchen dexmethylphenidate (FOCALIN XR) 10 MG 24 hr capsule Take 10 mg by mouth daily.    Marland Kitchen escitalopram (LEXAPRO) 10 MG tablet Take 10 mg by mouth daily.    . metroNIDAZOLE (METROGEL VAGINAL) 0.75 % vaginal gel Place 1 Applicatorful vaginally at bedtime. X 5 nights, no alcohol or sex while using (Patient not taking: Reported on 06/12/2017) 70 g 0   No current facility-administered medications on file prior to visit.      Review of Systems  Constitutional: Negative for activity change, appetite change and fever.  Eyes: Negative for visual disturbance.  Respiratory: Negative for cough, chest tightness and shortness of breath.     Cardiovascular: Negative for chest pain, palpitations and leg swelling.  Gastrointestinal: Negative for abdominal pain, nausea and vomiting.  Genitourinary: Negative for dysuria, frequency and urgency.  Neurological: Negative for dizziness, syncope and light-headedness.  Hematological: Negative for adenopathy.     Objective:   BP 110/80 (BP Location: Left Arm, Patient Position: Sitting, Cuff Size: Normal)   Pulse 74   Temp 98.6 F (37 C) (Temporal)   Resp 16   Ht '5\' 4"'$  (1.626 m)  Wt 146 lb 4 oz (66.3 kg)   LMP 05/29/2017 (Approximate)   SpO2 99%   BMI 25.10 kg/m   Physical Exam  Constitutional: She is oriented to person, place, and time. She appears well-developed and well-nourished. No distress.  HENT:  Head: Normocephalic and atraumatic.  Nose: Nose normal.  Mouth/Throat: Oropharynx is clear and moist.  Eyes: Pupils are equal, round, and reactive to light. No scleral icterus.  Pale conjunctiva.  Neck: Normal range of motion. Neck supple. No JVD present. No tracheal deviation present. No thyromegaly present.  Cardiovascular: Normal rate, regular rhythm and normal heart sounds.  Pulmonary/Chest: Effort normal and breath sounds normal. No respiratory distress.  Abdominal: Soft. Bowel sounds are normal.  Lymphadenopathy:    She has no cervical adenopathy.  Neurological: She is alert and oriented to person, place, and time. No cranial nerve deficit. Coordination normal.  Skin: Skin is warm and dry.  Psychiatric: She has a normal mood and affect. Her behavior is normal. Judgment and thought content normal.  Nursing note and vitals reviewed.    Assessment and Plan  1. Anemia, unspecified type CBC reviewed in chart which revealed microcytic anemia.  Check iron studies and repeat CBC at this time.  Suspect secondary to menses and low iron intake. - CBC with Differential - Fe+TIBC+Fer  2. Fatigue, unspecified type Labs from her complete physical exam performed in March  2018 and her gynecologic visit several months ago were discussed with patient.  Thyroid was normal at that time.  Suspect fatigue secondary to anemia versus mood disorder.  3. Acne vulgaris Skin care discussed with patient.  Acne medicine refilled. - clindamycin-benzoyl peroxide (BENZACLIN) gel; Apply topically 2 (two) times daily.  Dispense: 25 g; Refill: 3  4. Mood disorder [facility]) Discussed with patient that she needs to continue her visits with psychiatry for management of her mood disorder/anxiety/depression/ADD.  She voiced understanding and will continue her visits at [facility].  Patient has suicide hotline number and contact information for her psychiatrist if she had any urgent problem with her mood.  It is requested for patient to bring her immunization records to her next office visit.  Tdap administered today. Records reviewed in chart.  Patient to call with any questions or concerns and keep scheduled follow-up.  Return in about 3 months (around 09/20/2017) for CPE. Caren Macadam, MD 06/12/2017

## 2017-06-13 ENCOUNTER — Telehealth: Payer: Self-pay | Admitting: *Deleted

## 2017-06-13 ENCOUNTER — Telehealth: Payer: Self-pay | Admitting: Family Medicine

## 2017-06-13 LAB — CBC WITH DIFFERENTIAL/PLATELET
Basophils Absolute: 32 cells/uL (ref 0–200)
Basophils Relative: 0.3 %
Eosinophils Absolute: 105 cells/uL (ref 15–500)
Eosinophils Relative: 1 %
HCT: 34.9 % — ABNORMAL LOW (ref 35.0–45.0)
Hemoglobin: 11.1 g/dL — ABNORMAL LOW (ref 11.7–15.5)
Lymphs Abs: 3392 cells/uL (ref 850–3900)
MCH: 22.9 pg — ABNORMAL LOW (ref 27.0–33.0)
MCHC: 31.8 g/dL — ABNORMAL LOW (ref 32.0–36.0)
MCV: 72 fL — ABNORMAL LOW (ref 80.0–100.0)
MPV: 9.9 fL (ref 7.5–12.5)
Monocytes Relative: 5 %
Neutro Abs: 6447 cells/uL (ref 1500–7800)
Neutrophils Relative %: 61.4 %
Platelets: 340 10*3/uL (ref 140–400)
RBC: 4.85 10*6/uL (ref 3.80–5.10)
RDW: 14.4 % (ref 11.0–15.0)
Total Lymphocyte: 32.3 %
WBC mixed population: 525 cells/uL (ref 200–950)
WBC: 10.5 10*3/uL (ref 3.8–10.8)

## 2017-06-13 LAB — IRON,TIBC AND FERRITIN PANEL
%SAT: 16 % (calc) (ref 11–50)
Ferritin: 58 ng/mL (ref 10–154)
Iron: 60 ug/dL (ref 40–190)
TIBC: 375 mcg/dL (calc) (ref 250–450)

## 2017-06-13 MED ORDER — FERROUS SULFATE 325 (65 FE) MG PO TABS: 325.0000 mg | ORAL_TABLET | Freq: Two times a day (BID) | ORAL | 0 refills | Status: DC

## 2017-06-13 NOTE — Telephone Encounter (Signed)
Please call patient and advised that she is anemic with a hemoglobin of 11 and her iron stores are low in her body and in her blood.  Please advised her that we can start the iron pills as we discussed.  Please take as directed with vitamin C or small amount of orange juice to increase absorption of the iron.  We can recheck her levels when she comes in for her physical.  Take 1 iron pill twice a day as directed.  Will call medication into the pharmacy.  Please tell patient that I have noted on prescription that she would like the smallest iron pill that is available.

## 2017-06-13 NOTE — Telephone Encounter (Signed)
Called patient regarding message below. No answer, left generic message for patient to return call.   

## 2017-06-13 NOTE — Telephone Encounter (Signed)
Patient returned Robin Delgado's call

## 2017-06-13 NOTE — Telephone Encounter (Signed)
Called patient regarding message below. No answer, unable to leave message.  

## 2017-06-14 NOTE — Telephone Encounter (Signed)
Returned call, left message. See previous tele note.

## 2017-06-14 NOTE — Telephone Encounter (Signed)
Called patient regarding message below. No answer, left generic message for patient to return call.   

## 2017-06-14 NOTE — Telephone Encounter (Signed)
Patient informed of message below, verbalized understanding.  

## 2017-07-12 ENCOUNTER — Other Ambulatory Visit: Payer: Self-pay

## 2017-07-12 ENCOUNTER — Encounter: Payer: Self-pay | Admitting: Family Medicine

## 2017-07-12 ENCOUNTER — Ambulatory Visit: Payer: BLUE CROSS/BLUE SHIELD | Admitting: Family Medicine

## 2017-07-12 VITALS — BP 130/80 | HR 77 | Temp 97.9°F | Resp 16 | Ht 63.0 in | Wt 148.8 lb

## 2017-07-12 DIAGNOSIS — R300 Dysuria: Secondary | ICD-10-CM

## 2017-07-12 DIAGNOSIS — N3001 Acute cystitis with hematuria: Secondary | ICD-10-CM | POA: Diagnosis not present

## 2017-07-12 DIAGNOSIS — R8290 Unspecified abnormal findings in urine: Secondary | ICD-10-CM | POA: Diagnosis not present

## 2017-07-12 LAB — POCT URINALYSIS DIPSTICK
Bilirubin, UA: NEGATIVE
Glucose, UA: NEGATIVE
Ketones, UA: NEGATIVE
Nitrite, UA: NEGATIVE
Spec Grav, UA: 1.025 (ref 1.010–1.025)
Urobilinogen, UA: 0.2 E.U./dL
pH, UA: 6 (ref 5.0–8.0)

## 2017-07-12 MED ORDER — CIPROFLOXACIN HCL 500 MG PO TABS: 500.0000 mg | ORAL_TABLET | Freq: Two times a day (BID) | ORAL | 0 refills | Status: DC

## 2017-07-12 NOTE — Progress Notes (Signed)
Patient ID: Robin Delgado, female    DOB: Jun 07, 1981, 37 y.o.   MRN: [id number]  Chief Complaint  Patient presents with  . Dysuria    Allergies Macrobid [nitrofurantoin macrocrystal]  Subjective:   Robin Delgado is a 37 y.o. female who presents to [facility] today.  HPI Patient presents today with a complaint that she has not been feeling good for the past several days.  Reports that she has been feeling tired and overall very poorly.  Reports that her urine has been a dark color and it has had a bad odor.  Small amount of vaginal discharge but nothing abnormal.  Is not currently sexually active.  Denies any pelvic pain.  Has never really had a urinary tract infection in the past.  Reports she has not had sexually transmitted infections but has had bacterial vaginosis in the past.  Reports she does not feel well.  Denies any fevers, chills, nausea, vomiting, or diarrhea.  Bowel movements are normal.  Is urinating without difficulty.  May be some slight increase in frequency.  Reports that she is not really having any pain with urination.  Appetite slightly decreased.  Drinking normal.  Reports she has been drinking lots of Pepsi.  Urinary Tract Infection   This is a new problem. The current episode started in the past 7 days. The problem occurs intermittently. The problem has been unchanged. The pain is at a severity of 0/10. The patient is experiencing no pain. There has been no fever. She is not sexually active. There is no history of pyelonephritis. Associated symptoms include hematuria. Pertinent negatives include no chills, discharge, flank pain, frequency, hesitancy, nausea, possible pregnancy, sweats, urgency or vomiting. She has tried nothing for the symptoms. There is no history of kidney stones, recurrent UTIs, a single kidney or urinary stasis.    Past Medical History:  Diagnosis Date  . Anemia   . CIN II (cervical intraepithelial neoplasia II) 2008     Colpo/Leep   . Depression   . STD (sexually transmitted disease)    Trichomonas,     Past Surgical History:  Procedure Laterality Date  . CHOLECYSTECTOMY N/A 09/25/2013   Procedure: LAPAROSCOPIC CHOLECYSTECTOMY;  Surgeon: Jamesetta So, MD;  Location: [facility] ORS;  Service: General;  Laterality: N/A;  . DILATION AND CURETTAGE OF UTERUS N/A 07/09/1998   and 02/07/2003  . ESOPHAGOGASTRODUODENOSCOPY N/A 09/15/2013   Procedure: ESOPHAGOGASTRODUODENOSCOPY (EGD);  Surgeon: Danie Binder, MD;  Location: [facility] END[facility] SU[facility];  Service: Endoscopy;  Laterality: N/A;  11:30-moved to [place] notified pt  . LAPAROSCOPIC BILATERAL SALPINGECTOMY Bilateral 11/10/2014   Procedure: LAPAROSCOPIC BILATERAL SALPINGECTOMY;  Surgeon: Florian Buff, MD;  Location: [facility] ORS;  Service: Gynecology;  Laterality: Bilateral;  . TUBAL LIGATION      Family History  Problem Relation Age of Onset  . Hypertension Mother   . Alcohol abuse Mother   . Stomach cancer Father        stomach   . Hypertension Brother   . Alcohol abuse Brother   . ADD / ADHD Son   . Anxiety disorder Brother   . Depression Brother   . Diabetes Maternal Grandmother   . Drug abuse Neg Hx   . Bipolar disorder Neg Hx   . Dementia Neg Hx   . OCD Neg Hx   . Paranoid behavior Neg Hx   . Schizophrenia Neg Hx   . Seizures Neg Hx   . Sexual abuse Neg  Hx   . Physical abuse Neg Hx   . Colon cancer Neg Hx      Social History   Socioeconomic History  . Marital status: Single    Spouse name: None  . Number of children: None  . Years of education: None  . Highest education level: None  Social Needs  . Financial resource strain: None  . Food insecurity - worry: None  . Food insecurity - inability: None  . Transportation needs - medical: None  . Transportation needs - non-medical: None  Occupational History  . Occupation: Home Health  Tobacco Use  . Smoking status: Never Smoker  . Smokeless tobacco: Never Used  Substance and Sexual Activity   . Alcohol use: No    Frequency: Never    Comment: Drinks occasionally - glass of wine  . Drug use: No  . Sexual activity: Not Currently    Birth control/protection: Patch, Surgical  Other Topics Concern  . None  Social History Narrative   In school for medical office/nursing.    Live in [place].   Has 3 children, 17, 76, 11 ages.    No tobacco use.    Works 7 days a week.    Review of Systems  Constitutional: Positive for fatigue. Negative for chills, diaphoresis, fever and unexpected weight change.  Respiratory: Negative for cough, choking and chest tightness.   Cardiovascular: Negative for chest pain, palpitations and leg swelling.  Gastrointestinal: Negative for nausea and vomiting.  Genitourinary: Positive for hematuria. Negative for difficulty urinating, flank pain, frequency, hesitancy, pelvic pain, urgency, vaginal bleeding and vaginal discharge.       Is on birth control.  Not scheduled to start her menses for another 2 weeks.  Menses have been regular without problems.  Musculoskeletal: Negative for myalgias.  Skin: Negative for rash.  Neurological: Negative for dizziness, tremors, weakness and headaches.  Hematological: Negative for adenopathy. Does not bruise/bleed easily.     Objective:   BP 130/80 (BP Location: Left Arm, Patient Position: Sitting, Cuff Size: Normal)   Pulse 77   Temp 97.9 F (36.6 C) (Other (Comment))   Resp 16   Ht '5\' 3"'$  (1.6 m)   Wt 148 lb 12 oz (67.5 kg)   SpO2 99%   BMI 26.35 kg/m   Physical Exam  Constitutional: She appears well-developed and well-nourished.  Patient appears that she does not feel well.  Eyes: EOM are normal. Pupils are equal, round, and reactive to light.  Abdominal: Soft. Bowel sounds are normal. She exhibits no distension. There is no tenderness. There is no rebound and no guarding.  No CVA tenderness to palpation.  Skin: Skin is warm and dry. No rash noted.  Psychiatric: She has a normal mood and affect. Her  behavior is normal.     Assessment and Plan    1. Acute cystitis with hematuria Treat patient for UTI at this time.  Counseled concerning worrisome signs and symptoms and if those develop return to clinic or seek medical advice at ED.  Patient was told to increase her fluid intake.  She was told to drink water.  In addition, she was told that if she develops any fevers, chills, back pain, nausea, or vomiting, or abdominal pain to return to clinic or go to the emergency department. - ciprofloxacin (CIPRO) 500 MG tablet; Take 1 tablet (500 mg total) by mouth 2 (two) times daily.  Dispense: 10 tablet; Refill: 0 - POCT Urinalysis Dipstick She is to return to the lab  in 3 weeks for a repeat urinalysis with reflex to microscopy if abnormal.  I did discuss with patient that we needed to make sure that the blood that was in her urine did clear. - Urinalysis, Routine w reflex microscopic -Patient was given a note to miss work for the next 2 days if needed. Patient was encouraged to schedule a complete physical examination. Return if symptoms worsen or fail to improve. Caren Macadam, MD 07/12/2017

## 2017-07-15 ENCOUNTER — Other Ambulatory Visit (HOSPITAL_COMMUNITY)
Admission: RE | Admit: 2017-07-15 | Discharge: 2017-07-15 | Disposition: A | Discharge status: Home / Self Care | Payer: BLUE CROSS/BLUE SHIELD | Source: Other Acute Inpatient Hospital | Attending: Family Medicine | Admitting: Family Medicine

## 2017-07-15 DIAGNOSIS — R8290 Unspecified abnormal findings in urine: Secondary | ICD-10-CM | POA: Insufficient documentation

## 2017-09-09 ENCOUNTER — Other Ambulatory Visit: Payer: Self-pay | Admitting: Family Medicine

## 2017-09-10 ENCOUNTER — Encounter: Payer: Self-pay | Admitting: Family Medicine

## 2017-09-27 ENCOUNTER — Ambulatory Visit (INDEPENDENT_AMBULATORY_CARE_PROVIDER_SITE_OTHER): Payer: BLUE CROSS/BLUE SHIELD | Admitting: Family Medicine

## 2017-09-27 ENCOUNTER — Other Ambulatory Visit: Payer: Self-pay

## 2017-09-27 ENCOUNTER — Encounter: Payer: Self-pay | Admitting: Family Medicine

## 2017-09-27 VITALS — BP 118/72 | HR 66 | Temp 98.6°F | Resp 16 | Ht 64.0 in | Wt 150.0 lb

## 2017-09-27 DIAGNOSIS — F419 Anxiety disorder, unspecified: Secondary | ICD-10-CM

## 2017-09-27 DIAGNOSIS — F329 Major depressive disorder, single episode, unspecified: Secondary | ICD-10-CM | POA: Diagnosis not present

## 2017-09-27 DIAGNOSIS — F32A Depression, unspecified: Secondary | ICD-10-CM

## 2017-09-27 DIAGNOSIS — R300 Dysuria: Secondary | ICD-10-CM

## 2017-09-27 LAB — POCT URINALYSIS DIPSTICK
Bilirubin, UA: NEGATIVE
Glucose, UA: NEGATIVE
Nitrite, UA: NEGATIVE
Protein, UA: NEGATIVE
Spec Grav, UA: >=1.03 — AB (ref 1.010–1.025)
Urobilinogen, UA: 1 E.U./dL
pH, UA: 5.5 (ref 5.0–8.0)

## 2017-09-27 MED ORDER — SULFAMETHOXAZOLE-TRIMETHOPRIM 800-160 MG PO TABS: 1.0000 | ORAL_TABLET | Freq: Two times a day (BID) | ORAL | 0 refills | Status: DC

## 2017-09-27 NOTE — Progress Notes (Signed)
Chief Complaint  Patient presents with  . Flank Pain    she thinks maybe UTI   Patient is here for back pain and difficulty urinating.  She states she gets a sensation she needs to urinate, when she sits down nothing comes out.  At other times she urinates normally.  She feels like she is emptying her bladder normally.  No hematuria.  At times her urine looks cloudy.  No vaginal discharge.  No history of urinary tract infections.  No history of kidney stones.  She also has some low back pressure.  This only happens at the time she needs to urinate.  She is certain she is not pregnant.  Notes dysuria.  No skin rash or discomfort external genitalia. She does not think she has been exposed to any STDs. No fever or chills. No nausea or vomiting.  She tells me that she is off all of her psychiatric medicines.  She is has been sober for over a month.  She does not feel well.  She is anxious.  At times she feels depressed.  She would like to get back on her medications.  I offered her referral back to her psychologist for consultation.  I am not comfortable refilling her psychiatric medication she is here for a walk-in visit.  Patient Active Problem List   Diagnosis Date Noted  . Mood disorder (HCC) 06/12/2017  . Acne vulgaris 06/12/2017  . Fatigue 06/12/2017  . Anemia 06/12/2017  . MDD (major depressive disorder) 01/31/2016  . Unspecified contraceptive management 05/21/2013  . BV (bacterial vaginosis) 03/14/2013  . Anemia, iron deficiency 11/11/2012  . Generalized anxiety disorder 09/17/2012  . OCD (obsessive compulsive disorder) 09/17/2012  . Bipolar I disorder, most recent episode depressed (HCC) 08/13/2012  . Insomnia 08/13/2012    Outpatient Encounter Medications as of 09/27/2017  Medication Sig  . clindamycin-benzoyl peroxide (BENZACLIN) gel Apply topically 2 (two) times daily.  . clotrimazole-betamethasone (LOTRISONE) cream Apply 1 application topically 2 (two) times daily.  .  norelgestromin-ethinyl estradiol Burr Medico(XULANE) 150-35 MCG/24HR transdermal patch Place 1 patch onto the skin once a week.  . sulfamethoxazole-trimethoprim (BACTRIM DS,SEPTRA DS) 800-160 MG tablet Take 1 tablet by mouth 2 (two) times daily.   No facility-administered encounter medications on file as of 09/27/2017.     Allergies  Allergen Reactions  . Macrobid [Nitrofurantoin Macrocrystal] Itching    Review of Systems  Constitutional: Negative for activity change, appetite change, chills, fever and unexpected weight change.  HENT: Negative for congestion, dental problem, postnasal drip and rhinorrhea.   Eyes: Negative for redness and visual disturbance.  Respiratory: Negative for cough and shortness of breath.   Cardiovascular: Negative for chest pain, palpitations and leg swelling.  Gastrointestinal: Negative for abdominal pain, constipation, diarrhea, nausea and vomiting.  Genitourinary: Positive for difficulty urinating and flank pain. Negative for frequency and menstrual problem.  Musculoskeletal: Negative for arthralgias and back pain.  Neurological: Negative for dizziness and headaches.  Psychiatric/Behavioral: Positive for dysphoric mood and sleep disturbance. Negative for self-injury and suicidal ideas. The patient is nervous/anxious.     BP 118/72 (BP Location: Left Arm, Patient Position: Sitting, Cuff Size: Normal)   Pulse 66   Temp 98.6 F (37 C) (Temporal)   Resp 16   Ht 5\' 4"  (1.626 m)   Wt 150 lb (68 kg)   SpO2 98%   BMI 25.75 kg/m   Physical Exam  Constitutional: She is oriented to person, place, and time. She appears well-developed and well-nourished.  Appears tired  HENT:  Head: Normocephalic and atraumatic.  Mouth/Throat: Oropharynx is clear and moist.  Eyes: Pupils are equal, round, and reactive to light. Conjunctivae are normal.  Neck: Normal range of motion. Neck supple. No thyromegaly present.  Cardiovascular: Normal rate, regular rhythm and normal heart  sounds.  Pulmonary/Chest: Effort normal and breath sounds normal. No respiratory distress.  Abdominal: Soft. Bowel sounds are normal.  Abdomen soft and nontender.  Musculoskeletal: Normal range of motion. She exhibits no edema.  Lumbar spine is straight and symmetric. Full range of motion. No tenderness or muscle spasm. Strength, sensation, range of motion, and reflexes are normal in both lower extremities. Straight leg raise is negative bilateral.   Lymphadenopathy:    She has no cervical adenopathy.  Neurological: She is alert and oriented to person, place, and time.  Gait normal  Skin: Skin is warm and dry.  Psychiatric: She has a normal mood and affect. Her behavior is normal. Thought content normal.  Nursing note and vitals reviewed.   ASSESSMENT/PLAN:  1. Dysuria Patient has had negative urine culture when last evaluated.  She states she has had leukocytes on more than one urinalysis.  Her urine is not really that suspicious, but we will send it for both urine culture and urine cytology. - POCT Urinalysis Dipstick - Urine Culture - Urinalysis, Routine w reflex microscopic - Urine cytology ancillary only  2. Anxiety and depression Referred back to behavioral health here in [place].  Patient prefers to go back to [facility], her prior counselor.  Chart review indicates she has not seen her since 2016.  I did place the referral. - Ambulatory referral to Psychiatry   Patient Instructions  Ibuprofen for back pain Push fluids Take the septra 2x a day  We will let you know the urine test results  I will place referral for anxiety   Eustace Moore, MD

## 2017-09-27 NOTE — Patient Instructions (Addendum)
Ibuprofen for back pain Push fluids Take the septra 2x a day  We will let you know the urine test results  I will place referral for anxiety

## 2017-09-29 ENCOUNTER — Encounter: Payer: Self-pay | Admitting: Family Medicine

## 2017-09-29 LAB — URINE CULTURE
MICRO NUMBER:: 90368184
SPECIMEN QUALITY:: ADEQUATE

## 2017-09-29 LAB — URINALYSIS, ROUTINE W REFLEX MICROSCOPIC
Bilirubin Urine: NEGATIVE
Glucose, UA: NEGATIVE
Hgb urine dipstick: NEGATIVE
Ketones, ur: NEGATIVE
Leukocytes, UA: NEGATIVE
Nitrite: NEGATIVE
Protein, ur: NEGATIVE
Specific Gravity, Urine: 1.027 (ref 1.001–1.03)
pH: 6 (ref 5.0–8.0)

## 2017-09-30 ENCOUNTER — Other Ambulatory Visit (HOSPITAL_COMMUNITY)
Admission: RE | Admit: 2017-09-30 | Discharge: 2017-09-30 | Disposition: A | Discharge status: Home / Self Care | Payer: BLUE CROSS/BLUE SHIELD | Source: Other Acute Inpatient Hospital | Attending: Family Medicine | Admitting: Family Medicine

## 2017-09-30 ENCOUNTER — Encounter: Payer: Self-pay | Admitting: Women's Health

## 2017-09-30 DIAGNOSIS — R300 Dysuria: Secondary | ICD-10-CM | POA: Diagnosis present

## 2017-10-02 LAB — URINE CULTURE: Culture: <10000 — AB

## 2017-10-23 ENCOUNTER — Encounter: Payer: Self-pay | Admitting: Women's Health

## 2017-10-23 ENCOUNTER — Other Ambulatory Visit (HOSPITAL_COMMUNITY)
Admission: RE | Admit: 2017-10-23 | Discharge: 2017-10-23 | Disposition: A | Discharge status: Home / Self Care | Payer: BLUE CROSS/BLUE SHIELD | Source: Ambulatory Visit | Attending: Obstetrics & Gynecology | Admitting: Obstetrics & Gynecology

## 2017-10-23 ENCOUNTER — Ambulatory Visit (INDEPENDENT_AMBULATORY_CARE_PROVIDER_SITE_OTHER): Payer: BLUE CROSS/BLUE SHIELD | Admitting: Women's Health

## 2017-10-23 VITALS — BP 126/70 | HR 72 | Ht 64.0 in | Wt 150.5 lb

## 2017-10-23 DIAGNOSIS — Z01419 Encounter for gynecological examination (general) (routine) without abnormal findings: Secondary | ICD-10-CM

## 2017-10-23 NOTE — Progress Notes (Signed)
   WELL-WOMAN EXAMINATION Patient name: Robin Delgado MRN [id number]  Date of birth: 06/01/1981 Chief Complaint:   Gynecologic Exam  History of Present Illness:   Robin Delgado is a 37 y.o. G3P3 African American female being seen today for a routine well-woman exam.  Current complaints: wants to know if we have something to decrease her sex drive, not in a relationship/doesn't want to be, has sexual urges she wishes she didn't have.   PCP: Tracie HarrierHagler      does not desire labs Patient's last menstrual period was 10/14/2017 (exact date). The current method of family planning is tubal ligation, uses Xulane patches for period management Last pap 10/11/14 w/ PCP. Results were: normal. Has h/o abnormal pap x 1 in past, had some procedure done in office, doesn't remember what it was Last mammogram: never. Results were: n/a Last colonoscopy: never. Results were: n/a  Review of Systems:   Pertinent items are noted in HPI Denies any headaches, blurred vision, fatigue, shortness of breath, chest pain, abdominal pain, abnormal vaginal discharge/itching/odor/irritation, problems with periods, bowel movements, urination, or intercourse unless otherwise stated above. Pertinent History Reviewed:  Reviewed past medical,surgical, social and family history.  Reviewed problem list, medications and allergies. Physical Assessment:   Vitals:   10/23/17 1446  BP: 126/70  Pulse: 72  Weight: 150 lb 8 oz (68.3 kg)  Height: 5\' 4"  (1.626 m)  Body mass index is 25.83 kg/m.        Physical Examination:   General appearance - well appearing, and in no distress  Mental status - alert, oriented to person, place, and time  Psych:  She has a normal mood and affect  Skin - warm and dry, normal color, no suspicious lesions noted  Chest - effort normal, all lung fields clear to auscultation bilaterally  Heart - normal rate and regular rhythm  Neck:  midline trachea, no thyromegaly or nodules  Breasts -  breasts appear normal, no suspicious masses, no skin or nipple changes or  axillary nodes  Abdomen - soft, nontender, nondistended, no masses or organomegaly  Pelvic - VULVA: normal appearing vulva with no masses, tenderness or lesions  VAGINA: normal appearing vagina with normal color and discharge, no lesions  CERVIX: stenotic, barely visible os, friable- co-exam w/ JVF who states looks like she has had cryo in past, no CMT  Thin prep pap is done w/ HR HPV cotesting  UTERUS: uterus is felt to be normal size, shape, consistency and nontender   ADNEXA: No adnexal masses or tenderness noted.  Extremities:  No swelling or varicosities noted  No results found for this or any previous visit (from the past 24 hour(s)).  Assessment & Plan:  1) Well-Woman Exam  2) Desire to decrease libido> discussed there is nothing that I am aware of that can do this for her. She does have h/o dep/anx, not currently on meds, goes to therapy, has been on meds in past- but did not decrease her libido then  3) Stenotic cx> poss h/o cryo in past  Labs/procedures today: pap  Mammogram @38yo  or sooner if problems Colonoscopy @45 -50yo or sooner if problems  No orders of the defined types were placed in this encounter.   Follow-up: Return in about 1 year (around 10/24/2018) for Physical.  Cheral MarkerKimberly R James Lafalce CNM, [facility]NP-BC 10/23/2017 4:41 PM

## 2017-10-25 ENCOUNTER — Encounter: Payer: Self-pay | Admitting: Family Medicine

## 2017-10-25 LAB — CYTOLOGY - PAP
Chlamydia: NEGATIVE
Diagnosis: NEGATIVE
HPV: NOT DETECTED
Neisseria Gonorrhea: NEGATIVE

## 2017-11-14 ENCOUNTER — Ambulatory Visit (INDEPENDENT_AMBULATORY_CARE_PROVIDER_SITE_OTHER): Payer: BLUE CROSS/BLUE SHIELD | Admitting: Family Medicine

## 2017-11-14 ENCOUNTER — Other Ambulatory Visit: Payer: Self-pay

## 2017-11-14 ENCOUNTER — Encounter: Payer: Self-pay | Admitting: Family Medicine

## 2017-11-14 VITALS — BP 140/86 | HR 79 | Temp 97.6°F | Resp 12 | Ht 64.0 in | Wt 152.1 lb

## 2017-11-14 DIAGNOSIS — Z23 Encounter for immunization: Secondary | ICD-10-CM | POA: Diagnosis not present

## 2017-11-14 DIAGNOSIS — Z113 Encounter for screening for infections with a predominantly sexual mode of transmission: Secondary | ICD-10-CM | POA: Diagnosis not present

## 2017-11-14 DIAGNOSIS — L700 Acne vulgaris: Secondary | ICD-10-CM

## 2017-11-14 DIAGNOSIS — D509 Iron deficiency anemia, unspecified: Secondary | ICD-10-CM

## 2017-11-14 DIAGNOSIS — R5383 Other fatigue: Secondary | ICD-10-CM | POA: Diagnosis not present

## 2017-11-14 DIAGNOSIS — Z0000 Encounter for general adult medical examination without abnormal findings: Secondary | ICD-10-CM

## 2017-11-14 DIAGNOSIS — R739 Hyperglycemia, unspecified: Secondary | ICD-10-CM

## 2017-11-14 DIAGNOSIS — E559 Vitamin D deficiency, unspecified: Secondary | ICD-10-CM | POA: Diagnosis not present

## 2017-11-14 MED ORDER — CLINDAMYCIN PHOS-BENZOYL PEROX 1-5 % EX GEL: Freq: Two times a day (BID) | CUTANEOUS | 3 refills | Status: DC

## 2017-11-14 MED ORDER — VITAMIN D (ERGOCALCIFEROL) 1.25 MG (50000 UNIT) PO CAPS: 50000.0000 [IU] | ORAL_CAPSULE | ORAL | 0 refills | Status: DC

## 2017-11-14 NOTE — Progress Notes (Signed)
Patient ID: Robin Delgado, female    DOB: 03/09/1981, 37 y.o.   MRN: [id number]  Chief Complaint  Patient presents with  . Annual Exam    Allergies Macrobid [nitrofurantoin macrocrystal]  Subjective:   Robin Delgado is a 37 y.o. female who presents to [facility] today.  HPI Robin Delgado presents today for complete physical examination.  Is followed at [facility] for her gynecologic needs.  She reports that she recently had a Pap, pelvic, and clinical breast exam.  They are following her for her hormonal contraception.  She reports that she has an upcoming appointment with behavioral health.  She has previously been seen by [facility] for anxiety, depression, and bipolar disorder.  She reports that feels down and stressed. Related to work and life. Feels worried and never rested. Has taken mirtazepine in the past for mood.  Reports that she has also been on Lexapro, Wellbutrin, Focalin, and mirtazapine.  She reports that her appetite is good. Energy is low. Concentration is fair. Seems forgetful. Feel like cannot relax and is always on the go.  Reports that these issues have been going on for a long period of time.  She wishes to not start any medication today but await her appointment with behavioral health.  She reports she is not suicidal.  She denies any suicidal or homicidal ideations.  She reports that she would not hurt herself.  She reports that her life is just somewhat stressful.  She is getting ready to graduate in June and is also working as a Programmer, applications.  She reports that she rarely has time for herself.  She reports that her energy has been low for a long period of time.  She reports she does have a family history of diabetes and has had an elevated blood sugar in the past.  She reports that she did take her iron pills for several months and then she ran out of them.  She reports her vitamin D has been low in the past but she has not been on  medication for this.  She reports that she does not exercise and does not believe in exercising.  She denies any problems with her vision.  She reports that she does not believe in going to the dentist and she brushes her teeth well.  She denies any chest pain, shortness of breath or swelling in her extremities.  No palpitations.   Past Medical History:  Diagnosis Date  . Anemia   . CIN II (cervical intraepithelial neoplasia II) 2008   Colpo/Leep   . Depression   . STD (sexually transmitted disease)    Trichomonas,     Past Surgical History:  Procedure Laterality Date  . CHOLECYSTECTOMY N/A 09/25/2013   Procedure: LAPAROSCOPIC CHOLECYSTECTOMY;  Surgeon: Jamesetta So, MD;  Location: [facility]S;  Service: General;  Laterality: N/A;  . DILATION AND CURETTAGE OF UTERUS N/A 07/09/1998   and 02/07/2003  . ESOPHAGOGASTRODUODENOSCOPY N/A 09/15/2013   Procedure: ESOPHAGOGASTRODUODENOSCOPY (EGD);  Surgeon: Danie Binder, MD;  Location: [facility] END[facility] SUITE;  Service: Endoscopy;  Laterality: N/A;  11:30-moved to [place] notified pt  . LAPAROSCOPIC BILATERAL SALPINGECTOMY Bilateral 11/10/2014   Procedure: LAPAROSCOPIC BILATERAL SALPINGECTOMY;  Surgeon: Florian Buff, MD;  Location: [facility];  Service: Gynecology;  Laterality: Bilateral;  . TUBAL LIGATION      Family History  Problem Relation Age of Onset  . Hypertension Mother   . Alcohol abuse Mother   .  Stomach cancer Father        stomach   . Hypertension Brother   . Alcohol abuse Brother   . ADD / ADHD Son   . Anxiety disorder Brother   . Depression Brother   . Diabetes Maternal Grandmother   . Drug abuse Neg Hx   . Bipolar disorder Neg Hx   . Dementia Neg Hx   . OCD Neg Hx   . Paranoid behavior Neg Hx   . Schizophrenia Neg Hx   . Seizures Neg Hx   . Sexual abuse Neg Hx   . Physical abuse Neg Hx   . Colon cancer Neg Hx      Social History   Socioeconomic History  . Marital status: Single    Spouse name: Not on file  . Number of  children: Not on file  . Years of education: Not on file  . Highest education level: Not on file  Occupational History  . Occupation: [employer]  . Financial resource strain: Not on file  . Food insecurity:    Worry: Not on file    Inability: Not on file  . Transportation needs:    Medical: Not on file    Non-medical: Not on file  Tobacco Use  . Smoking status: Never Smoker  . Smokeless tobacco: Never Used  Substance and Sexual Activity  . Alcohol use: No    Frequency: Never    Comment: Drinks occasionally - glass of wine  . Drug use: No  . Sexual activity: Not Currently    Birth control/protection: Patch, Surgical  Lifestyle  . Physical activity:    Days per week: 0 days    Minutes per session: 0 min  . Stress: Very much  Relationships  . Social connections:    Talks on phone: Not on file    Gets together: Not on file    Attends religious service: Not on file    Active member of club or organization: Not on file    Attends meetings of clubs or organizations: Not on file    Relationship status: Not on file  Other Topics Concern  . Not on file  Social History Narrative   In school for medical office/nursing.    Live in [place].   Has 3 children, 17, 63, 11 ages.    No tobacco use.    Works 7 days a week.   Current Outpatient Medications on File Prior to Visit  Medication Sig Dispense Refill  . clotrimazole-betamethasone (LOTRISONE) cream Apply 1 application topically 2 (two) times daily. 30 g 3  . norelgestromin-ethinyl estradiol Robin Delgado) 150-35 MCG/24HR transdermal patch Place 1 patch onto the skin once a week. 9 patch 6   No current facility-administered medications on file prior to visit.     Review of Systems  Constitutional: Positive for fatigue. Negative for activity change, appetite change and fever.       Reports her weight is been the same.  She has not gained or lost weight.  Suffering from seasonal allergies.  HENT: Negative for dental  problem, ear pain, facial swelling, nosebleeds, rhinorrhea, sinus pressure, sinus pain, sore throat, tinnitus and voice change.   Eyes: Negative for visual disturbance.  Respiratory: Negative for cough, chest tightness, shortness of breath and wheezing.   Cardiovascular: Negative for chest pain, palpitations and leg swelling.  Gastrointestinal: Negative for abdominal distention, abdominal pain, blood in stool, constipation, nausea and vomiting.  Endocrine: Negative for polyphagia and polyuria.  Genitourinary:  Negative for dysuria, frequency, hematuria and urgency.       Reports that she did get checked for any vaginal infections while she was at the gynecologist.  However she reports she has not been checked for HIV syphilis, or hepatitis.  She reports that she would like to be checked for these today when she gets her blood work.  Musculoskeletal: Negative for back pain, gait problem and neck pain.  Skin: Negative for rash.       Ports that she would like a refill on the acne medicine.  She reports that it helps with her skin and she is seen improvement.  Neurological: Negative for dizziness, syncope and light-headedness.  Hematological: Negative for adenopathy.  Psychiatric/Behavioral: Positive for decreased concentration, dysphoric mood and sleep disturbance. Negative for behavioral problems, confusion, self-injury and suicidal ideas. The patient is nervous/anxious.      Objective:   BP 140/86 (BP Location: Left Arm, Patient Position: Sitting, Cuff Size: Normal)   Pulse 79   Temp 97.6 F (36.4 C) (Oral)   Resp 12   Ht '5\' 4"'$  (1.626 m)   Wt 152 lb 1.3 oz (69 kg)   SpO2 99%   BMI 26.10 kg/m   Physical Exam  Constitutional: She is oriented to person, place, and time. She appears well-developed and well-nourished. No distress.  HENT:  Head: Normocephalic and atraumatic.  Right Ear: External ear normal.  Left Ear: External ear normal.  Nose: Nose normal.  Mouth/Throat: Oropharynx is  clear and moist. No oropharyngeal exudate.  Eyes: Pupils are equal, round, and reactive to light. Conjunctivae and EOM are normal. No scleral icterus.  Neck: Normal range of motion. Neck supple. No JVD present. No tracheal deviation present. No thyromegaly present.  Cardiovascular: Normal rate, regular rhythm, normal heart sounds and intact distal pulses.  Pulmonary/Chest: Effort normal and breath sounds normal. No respiratory distress.  Abdominal: Soft. Bowel sounds are normal. She exhibits no distension. There is no tenderness. There is no guarding.  Musculoskeletal: Normal range of motion. She exhibits no edema.  Lymphadenopathy:    She has no cervical adenopathy.  Neurological: She is alert and oriented to person, place, and time. No cranial nerve deficit.  Skin: Skin is warm and dry. Capillary refill takes less than 2 seconds.  Psychiatric: She has a normal mood and affect. Her behavior is normal. Judgment and thought content normal.  Nursing note and vitals reviewed.   Depression screen [facility] 2/9 11/14/2017 10/23/2017 07/12/2017 06/12/2017 12/11/2016  Decreased Interest 1 0 0 0 2  Down, Depressed, Hopeless '1 2 1 '$ 0 1  PHQ - 2 Score '2 2 1 '$ 0 3  Altered sleeping 2 0 - - 0  Tired, decreased energy 3 1 - - 2  Change in appetite 2 0 - - 0  Feeling bad or failure about yourself  0 0 - - 0  Trouble concentrating 0 0 - - 1  Moving slowly or fidgety/restless 0 0 - - 0  Suicidal thoughts 0 0 - - 0  PHQ-9 Score 9 3 - - 6  Difficult doing work/chores Somewhat difficult - - - Not difficult at all    Assessment and Plan  1. Well adult exam Age-appropriate anticipatory guidance was given and discussed with patient. Dentist visits recommended every 6 months. Vision exam recommended. Screening cholesterol performed. Keep scheduled visits with OB/GYN for routine well woman care and management of contraception. Immunization records requested. Suspect blood pressure elevated today secondary to stress.   Will recheck  in 1 month. - Lipid panel  2. Acne vulgaris Refill of medication. - clindamycin-benzoyl peroxide (BENZACLIN) gel; Apply topically 2 (two) times daily.  Dispense: 25 g; Refill: 3  3. Fatigue, unspecified type Suspect fatigue is secondary to mood disorder and lifestyle.  Relaxation and follow-up with mental health recommended.  Will rule out other metabolic conditions causing fatigue today. - Vitamin B12 - COMPLETE METABOLIC PANEL WITH GFR  4. Iron deficiency anemia, unspecified iron deficiency anemia type We will check iron studies and see if she is anemic.  If she has we will plan to continue iron supplementation. - Iron, TIBC and Ferritin Panel - CBC with Differential/Platelet  5. Immunization due Vaccination given.  Patient asked to bring in her immunization records.  She reports that she has had her hepatitis A and B vaccinations. - Tdap vaccine greater than or equal to 7yo IM  6. Hyperglycemia Evidence of hyperglycemia on lab results with a family history of diabetes.  Screen for diabetes today. - Hemoglobin A1c  7. Vitamin D deficiency Refill at this time.  Check for improvement - Vitamin D, Ergocalciferol, (DRISDOL) 50000 units CAPS capsule; Take 1 capsule (50,000 Units total) by mouth every 7 (seven) days.  Dispense: 12 capsule; Refill: 0  8. Screen for STD (sexually transmitted disease) Screening performed today.  Safe sex recommended. - Hepatitis panel, acute - HIV antibody - RPR - TSH Patient believes blood pressure is elevated today secondary to stress.  We will plan on rechecking at follow-up in 1 month.  She was told to call with any questions or concerns.  Encouraged to keep her scheduled visit with psychiatry.  She was told if her mood was to worsen or she was to develop any suicidal or homicidal ideations or any problems to please call our office or go to the emergency department.  She voiced understanding. Return in about 1 month (around 12/12/2017) for  follow up. Caren Macadam, MD 11/14/2017
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
## 2017-11-25 ENCOUNTER — Encounter (HOSPITAL_COMMUNITY): Payer: Self-pay | Admitting: Psychiatry

## 2017-11-25 ENCOUNTER — Ambulatory Visit (INDEPENDENT_AMBULATORY_CARE_PROVIDER_SITE_OTHER): Payer: BLUE CROSS/BLUE SHIELD | Admitting: Psychiatry

## 2017-11-25 VITALS — BP 133/86 | HR 85 | Ht 64.0 in | Wt 150.0 lb

## 2017-11-25 DIAGNOSIS — Z811 Family history of alcohol abuse and dependence: Secondary | ICD-10-CM | POA: Diagnosis not present

## 2017-11-25 DIAGNOSIS — F331 Major depressive disorder, recurrent, moderate: Secondary | ICD-10-CM | POA: Diagnosis not present

## 2017-11-25 DIAGNOSIS — F411 Generalized anxiety disorder: Secondary | ICD-10-CM | POA: Diagnosis not present

## 2017-11-25 DIAGNOSIS — Z818 Family history of other mental and behavioral disorders: Secondary | ICD-10-CM

## 2017-11-25 DIAGNOSIS — Z79899 Other long term (current) drug therapy: Secondary | ICD-10-CM | POA: Diagnosis not present

## 2017-11-25 MED ORDER — MIRTAZAPINE 30 MG PO TABS: 30.0000 mg | ORAL_TABLET | Freq: Every day | ORAL | 0 refills | Status: DC

## 2017-11-25 NOTE — Patient Instructions (Signed)
1. Increase mirtazapine 30 mg at night  2. Return to clinic in one month for 30 mins 

## 2017-11-26 ENCOUNTER — Encounter: Payer: Self-pay | Admitting: Family Medicine

## 2017-11-26 ENCOUNTER — Telehealth: Payer: Self-pay | Admitting: Family Medicine

## 2017-11-26 LAB — COMPLETE METABOLIC PANEL WITH GFR
AG Ratio: 1.1 (calc) (ref 1.0–2.5)
ALT: 10 U/L (ref 6–29)
AST: 13 U/L (ref 10–30)
Albumin: 3.9 g/dL (ref 3.6–5.1)
Alkaline phosphatase (APISO): 106 U/L (ref 33–115)
BUN: 13 mg/dL (ref 7–25)
CO2: 27 mmol/L (ref 20–32)
Calcium: 9 mg/dL (ref 8.6–10.2)
Chloride: 107 mmol/L (ref 98–110)
Creat: 0.87 mg/dL (ref 0.50–1.10)
GFR, Est African American: 99 mL/min/{1.73_m2} (ref >=60)
GFR, Est Non African American: 85 mL/min/{1.73_m2} (ref >=60)
Globulin: 3.5 g/dL (calc) (ref 1.9–3.7)
Glucose, Bld: 103 mg/dL (ref 65–139)
Potassium: 3.7 mmol/L (ref 3.5–5.3)
Sodium: 140 mmol/L (ref 135–146)
Total Bilirubin: 0.3 mg/dL (ref 0.2–1.2)
Total Protein: 7.4 g/dL (ref 6.1–8.1)

## 2017-11-26 LAB — LIPID PANEL
Cholesterol: 144 mg/dL (ref <200)
HDL: 57 mg/dL (ref >50)
LDL Cholesterol (Calc): 70 mg/dL (calc)
Non-HDL Cholesterol (Calc): 87 mg/dL (calc) (ref <130)
Total CHOL/HDL Ratio: 2.5 (calc) (ref <5.0)
Triglycerides: 83 mg/dL (ref <150)

## 2017-11-26 LAB — CBC WITH DIFFERENTIAL/PLATELET
Basophils Absolute: 31 cells/uL (ref 0–200)
Basophils Relative: 0.3 %
Eosinophils Absolute: 73 cells/uL (ref 15–500)
Eosinophils Relative: 0.7 %
HCT: 35.6 % (ref 35.0–45.0)
Hemoglobin: 11.4 g/dL — ABNORMAL LOW (ref 11.7–15.5)
Lymphs Abs: 2818 cells/uL (ref 850–3900)
MCH: 23 pg — ABNORMAL LOW (ref 27.0–33.0)
MCHC: 32 g/dL (ref 32.0–36.0)
MCV: 71.8 fL — ABNORMAL LOW (ref 80.0–100.0)
MPV: 9.5 fL (ref 7.5–12.5)
Monocytes Relative: 4.1 %
Neutro Abs: 7051 cells/uL (ref 1500–7800)
Neutrophils Relative %: 67.8 %
Platelets: 334 10*3/uL (ref 140–400)
RBC: 4.96 10*6/uL (ref 3.80–5.10)
RDW: 14.5 % (ref 11.0–15.0)
Total Lymphocyte: 27.1 %
WBC mixed population: 426 cells/uL (ref 200–950)
WBC: 10.4 10*3/uL (ref 3.8–10.8)

## 2017-11-26 LAB — VITAMIN B12: Vitamin B-12: 464 pg/mL (ref 200–1100)

## 2017-11-26 LAB — RPR: RPR Ser Ql: NONREACTIVE

## 2017-11-26 LAB — HEMOGLOBIN A1C
Hgb A1c MFr Bld: 5.9 % of total Hgb — ABNORMAL HIGH (ref <5.7)
Mean Plasma Glucose: 123 (calc)
eAG (mmol/L): 6.8 (calc)

## 2017-11-26 LAB — HEPATITIS PANEL, ACUTE
Hep A IgM: NONREACTIVE
Hep B C IgM: NONREACTIVE
Hepatitis B Surface Ag: NONREACTIVE
Hepatitis C Ab: NONREACTIVE
SIGNAL TO CUT-OFF: 0.05 (ref <1.00)

## 2017-11-26 LAB — IRON,TIBC AND FERRITIN PANEL
%SAT: 17 % (calc) (ref 11–50)
Ferritin: 55 ng/mL (ref 10–154)
Iron: 66 ug/dL (ref 40–190)
TIBC: 385 mcg/dL (calc) (ref 250–450)

## 2017-11-26 LAB — TSH: TSH: 1.34 mIU/L

## 2017-11-26 LAB — HIV ANTIBODY (ROUTINE TESTING W REFLEX): HIV 1&2 Ab, 4th Generation: NONREACTIVE

## 2017-11-26 NOTE — Telephone Encounter (Signed)
Call patient and work in for an office visit.

## 2017-11-26 NOTE — Telephone Encounter (Signed)
Patient called in this morning to schedule an appointment to followup after hospital ER visit this morning for high blood pressure. I offered her an appt for June 4th at 10 am, the schedule is very limited for the next month, she said she will have to call me back. I tried to explain the protocol for 2 week followup after hospital, she said she isnt taking off work. I faxed a medical records request form to [facility] for her ER notes.

## 2017-11-28 ENCOUNTER — Encounter: Payer: Self-pay | Admitting: Family Medicine

## 2017-12-10 ENCOUNTER — Other Ambulatory Visit: Payer: Self-pay

## 2017-12-10 ENCOUNTER — Encounter: Payer: Self-pay | Admitting: Family Medicine

## 2017-12-10 ENCOUNTER — Ambulatory Visit: Payer: BLUE CROSS/BLUE SHIELD | Admitting: Family Medicine

## 2017-12-10 ENCOUNTER — Telehealth: Payer: Self-pay

## 2017-12-10 VITALS — BP 130/88 | HR 81 | Temp 98.3°F | Resp 14 | Ht 63.0 in | Wt 152.1 lb

## 2017-12-10 DIAGNOSIS — F32A Depression, unspecified: Secondary | ICD-10-CM

## 2017-12-10 DIAGNOSIS — R030 Elevated blood-pressure reading, without diagnosis of hypertension: Secondary | ICD-10-CM

## 2017-12-10 DIAGNOSIS — F329 Major depressive disorder, single episode, unspecified: Secondary | ICD-10-CM

## 2017-12-10 DIAGNOSIS — F419 Anxiety disorder, unspecified: Secondary | ICD-10-CM | POA: Diagnosis not present

## 2017-12-10 NOTE — Progress Notes (Signed)
Patient ID: Robin Delgado, female    DOB: 10/20/1980, 37 y.o.   MRN: [id number]  Chief Complaint  Patient presents with  . Hypertension    review lab results    Allergies Macrobid [nitrofurantoin macrocrystal]  Subjective:   Robin PigeonCynthia N Arens is a 37 y.o. female who [facility].  HPI Aram BeechamCynthia presents here for follow up to reportedly discuss labs.  However, she reports that she is not interested in taking any medication for her blood pressure or to help prevent diabetes.  She reports that she is not motivated to make changes in her diet or to exercise.  She reports she is not planning on changing the way she eats and plans on eating whatever foods she would like to eat.  She reports that we need to hurry up with the visit because she is headed to [employer] to get a burger.  Does not want to see a therapist. Just wants to eat and sleep.  She reports that she is not taking the remeron on a daily basis. Reports that is taking it several times a week just at night. Reports that took zoloft in the past and it made her heart race. Reports that has been on the remeron for over five years. Reports that is stressed to the max and feels like needs to be in the hospital to get her mood straightened out but cannot because she has kids.  She reports that she is the provider for her children and the father of her children is not actively involved in their care. She reports that she feels frustrated and irritable.  She reports that she was off from work this weekend and all she did with sleep. Wants to sleep and not deal with work or her family. Has three children, 7518, 3114, 828 years old. Reports that would go to an inpatient hospital to get help but cannot due to kids.  She reports that she has had issues with her mood for years but has not been able to get them sorted out because she has to take care of her children.  She reports that she does not feel good about taking a  higher level of medication and she does not plan on doubling the dose of the Remeron that Dr. Vanetta ShawlHisada had recommended.  She reports that she does not want to talk to a therapist today.  She reports that she is going to a graduation ceremony today and then she is going to go to work.  She does tell me that she would not hurt herself.  She denies any suicidal or homicidal ideations.  She reports that she finds it hard to believe that she is overweight.  She reports that for years everyone told her that she needed to gain weight.  She reports that she eats a very unhealthy diet full of junk and fast food.  She reports she was recently seen in the emergency department and had a high blood pressure.  She reports that she does believe she will develop high blood pressure down the road.  She denies any chest pain, shortness of breath, or swelling in her extremities.  I did review her labs with her today, she would like to discuss her prediabetes.  She does not want to take a medication for diabetes prevention but she also does not want to see a dietitian or change her dietary patterns.   Past Medical History:  Diagnosis Date  . Anemia   .  CIN II (cervical intraepithelial neoplasia II) 2008   Colpo/Leep   . Depression   . STD (sexually transmitted disease)    Trichomonas,     Past Surgical History:  Procedure Laterality Date  . CHOLECYSTECTOMY N/A 09/25/2013   Procedure: LAPAROSCOPIC CHOLECYSTECTOMY;  Surgeon: Dalia Heading, MD;  Location: [facility];  Service: General;  Laterality: N/A;  . DILATION AND CURETTAGE OF UTERUS N/A 07/09/1998   and 02/07/2003  . ESOPHAGOGASTRODUODENOSCOPY N/A 09/15/2013   Procedure: ESOPHAGOGASTRODUODENOSCOPY (EGD);  Surgeon: West Bali, MD;  Location: [facility];  Service: Endoscopy;  Laterality: N/A;  11:30-moved to [address] [place] notified pt  . LAPAROSCOPIC BILATERAL SALPINGECTOMY Bilateral 11/10/2014   Procedure: LAPAROSCOPIC BILATERAL SALPINGECTOMY;  Surgeon: Lazaro Arms, MD;  Location: [facility];  Service: Gynecology;  Laterality: Bilateral;  . TUBAL LIGATION      Family History  Problem Relation Age of Onset  . Hypertension Mother   . Alcohol abuse Mother   . Depression Mother   . Stomach cancer Father        stomach   . Hypertension Brother   . Alcohol abuse Brother   . ADD / ADHD Son   . Anxiety disorder Brother   . Depression Brother   . Bipolar disorder Sister   . Diabetes Maternal Grandmother   . Drug abuse Neg Hx   . Dementia Neg Hx   . OCD Neg Hx   . Paranoid behavior Neg Hx   . Schizophrenia Neg Hx   . Seizures Neg Hx   . Sexual abuse Neg Hx   . Physical abuse Neg Hx   . Colon cancer Neg Hx      Social History   Socioeconomic History  . Marital status: Single    Spouse name: Not on file  . Number of children: Not on file  . Years of education: Not on file  . Highest education level: Not on file  Occupational History  . Occupation: Home Health  Social Needs  . Financial resource strain: Not on file  . Food insecurity:    Worry: Not on file    Inability: Not on file  . Transportation needs:    Medical: Not on file    Non-medical: Not on file  Tobacco Use  . Smoking status: Never Smoker  . Smokeless tobacco: Never Used  Substance and Sexual Activity  . Alcohol use: No    Frequency: Never    Comment: Drinks occasionally - glass of wine  . Drug use: No  . Sexual activity: Not Currently    Birth control/protection: Patch, Surgical  Lifestyle  . Physical activity:    Days per week: 0 days    Minutes per session: 0 min  . Stress: Very much  Relationships  . Social connections:    Talks on phone: Not on file    Gets together: Not on file    Attends religious service: Not on file    Active member of club or organization: Not on file    Attends meetings of clubs or organizations: Not on file    Relationship status: Not on file  Other Topics Concern  . Not on file  Social History Narrative   In school for  medical office/nursing.    Live in [place].   Has 3 children, 17, 13, 11 ages.    No tobacco use.    Works 7 days a week.   Current Outpatient Medications on File Prior to Visit  Medication  Sig Dispense Refill  . clindamycin-benzoyl peroxide (BENZACLIN) gel Apply topically 2 (two) times daily. 25 g 3  . clotrimazole-betamethasone (LOTRISONE) cream Apply 1 application topically 2 (two) times daily. 30 g 3  . mirtazapine (REMERON) 30 MG tablet Take 1 tablet (30 mg total) by mouth at bedtime. 30 tablet 0  . norelgestromin-ethinyl estradiol Burr Medico) 150-35 MCG/24HR transdermal patch Place 1 patch onto the skin once a week. 9 patch 6  . Vitamin D, Ergocalciferol, (DRISDOL) 50000 units CAPS capsule Take 1 capsule (50,000 Units total) by mouth every 7 (seven) days. 12 capsule 0   No current facility-administered medications on file prior to visit.     Review of Systems  Constitutional: Positive for fatigue. Negative for activity change, appetite change, fever and unexpected weight change.  Eyes: Negative for visual disturbance.  Respiratory: Negative for cough, chest tightness and shortness of breath.   Cardiovascular: Negative for chest pain, palpitations and leg swelling.  Gastrointestinal: Negative for abdominal pain, nausea and vomiting.  Genitourinary: Negative for dysuria, frequency and urgency.  Neurological: Negative for dizziness, syncope and light-headedness.  Hematological: Negative for adenopathy.  Psychiatric/Behavioral: Positive for agitation, dysphoric mood and sleep disturbance. Negative for confusion, decreased concentration, hallucinations, self-injury and suicidal ideas. The patient is not nervous/anxious.      Objective:   BP 130/88 (BP Location: Left Arm, Patient Position: Sitting, Cuff Size: Normal)   Pulse 81   Temp 98.3 F (36.8 C) (Temporal)   Resp 14   Ht 5\' 3"  (1.6 m)   Wt 152 lb 1.3 oz (69 kg)   SpO2 99%   BMI 26.94 kg/m   Physical Exam  Constitutional:  She is oriented to person, place, and time. She appears well-developed and well-nourished. No distress.  HENT:  Head: Normocephalic and atraumatic.  Eyes: Pupils are equal, round, and reactive to light. Conjunctivae are normal. No scleral icterus.  Cardiovascular: Normal rate, regular rhythm and normal heart sounds.  Pulmonary/Chest: Effort normal and breath sounds normal. No respiratory distress.  Neurological: She is alert and oriented to person, place, and time. No cranial nerve deficit.  Skin: Skin is warm and dry.  Psychiatric: Her speech is normal and behavior is normal. Thought content normal. Her affect is angry and labile. Cognition and memory are normal. She exhibits a depressed mood. She expresses no homicidal and no suicidal ideation. She expresses no suicidal plans and no homicidal plans.  Nursing note and vitals reviewed.   Depression screen [facility] 2/9 12/10/2017 11/14/2017 10/23/2017 07/12/2017 06/12/2017  Decreased Interest 3 1 0 0 0  Down, Depressed, Hopeless 3 1 2 1  0  PHQ - 2 Score 6 2 2 1  0  Altered sleeping 2 2 0 - -  Tired, decreased energy 3 3 1  - -  Change in appetite 2 2 0 - -  Feeling bad or failure about yourself  1 0 0 - -  Trouble concentrating 2 0 0 - -  Moving slowly or fidgety/restless 0 0 0 - -  Suicidal thoughts 0 0 0 - -  PHQ-9 Score 16 9 3  - -  Difficult doing work/chores Somewhat difficult Somewhat difficult - - -   GAD 7 : Generalized Anxiety Score 12/10/2017  Nervous, Anxious, on Edge 3  Control/stop worrying 3  Worry too much - different things 3  Trouble relaxing 3  Restless 2  Easily annoyed or irritable 3  Afraid - awful might happen 2  Total GAD 7 Score 19  Anxiety Difficulty Somewhat difficult  Assessment and Plan  1. Anxiety and depression Long discussion today with patient regarding her mood and mood disorder.  We discussed mirtazapine and the recommendations made by Dr. Vanetta Shawl.  She is not interested in taking the Remeron.  She reports  that if her mood gets worse that she will call Dr. Bing Matter office and get in for sooner appointment.    [facility] referral was placed today. I did call the crisis line and speak with the behavioral health therapist.  She spoke with the patient today.  She reports that the patient is not an acute threat to herself.  She reported to me that the patient is agreeable to following up on an outpatient basis but does not need any criteria for voluntary commitment.  She reports that patient does not display any current behavioral suicide risks.  Patient does admit to being depressed on a long-term basis and would like improvement of her mood.  However we did discuss that she is not compliant with her recommended treatment.  She plans to discuss this with Dr. Vanetta Shawl.  Upon leaving after our discussion, she was agreeable to talking with therapist.  Patient has protective factors of family and community support.  Patient reports that family believes is behaving rationally. Patient displays problem solving skills.   Patient specifically denies suicide ideation. Patient has access/information to healthcare contacts if situation or mood changes where patient is a risk to self or others or mood becomes unstable.   During the encounter, the patient had good eye contact and firm handshake regarding safety contract and agreement to seek help if mood worsens and not to harm self.   Patient understands the treatment plan and is in agreement. Agrees to keep follow up and call prior or return to clinic if needed.    2. Elevated BP without diagnosis of hypertension Patient's blood pressure was prehypertensive today but not necessitating medication.  Patient defers any medication for her blood pressure.  We had a long discussion about blood pressure, weight, and prediabetes.  She reports she might try to make some improvements in her diet.  She reports that she will start by not getting fast food today and trying to eat  healthier.  I discussed with patient how diet and sodium intake affect blood pressure.  She will follow-up in 1 month or sooner if needed.  She also was also agreeable to follow-up with behavioral health and speak with the behavioral health therapist.  Return in about 1 month (around 01/07/2018), or if symptoms worsen or fail to improve, for follow up. Aliene Beams, MD 12/10/2017

## 2017-12-10 NOTE — Patient Instructions (Signed)
DASH Eating Plan DASH stands for "Dietary Approaches to Stop Hypertension." The DASH eating plan is a healthy eating plan that has been shown to reduce high blood pressure (hypertension). It may also reduce your risk for type 2 diabetes, heart disease, and stroke. The DASH eating plan may also help with weight loss. What are tips for following this plan? General guidelines  Avoid eating more than 2,300 mg (milligrams) of salt (sodium) a day. If you have hypertension, you may need to reduce your sodium intake to 1,500 mg a day.  Limit alcohol intake to no more than 1 drink a day for nonpregnant women and 2 drinks a day for men. One drink equals 12 oz of beer, 5 oz of wine, or 1 oz of hard liquor.  Work with your health care provider to maintain a healthy body weight or to lose weight. Ask what an ideal weight is for you.  Get at least 30 minutes of exercise that causes your heart to beat faster (aerobic exercise) most days of the week. Activities may include walking, swimming, or biking.  Work with your health care provider or diet and nutrition specialist (dietitian) to adjust your eating plan to your individual calorie needs. Reading food labels  Check food labels for the amount of sodium per serving. Choose foods with less than 5 percent of the Daily Value of sodium. Generally, foods with less than 300 mg of sodium per serving fit into this eating plan.  To find whole grains, look for the word "whole" as the first word in the ingredient list. Shopping  Buy products labeled as "low-sodium" or "no salt added."  Buy fresh foods. Avoid canned foods and premade or frozen meals. Cooking  Avoid adding salt when cooking. Use salt-free seasonings or herbs instead of table salt or sea salt. Check with your health care provider or pharmacist before using salt substitutes.  Do not fry foods. Cook foods using healthy methods such as baking, boiling, grilling, and broiling instead.  Cook with  heart-healthy oils, such as olive, canola, soybean, or sunflower oil. Meal planning   Eat a balanced diet that includes: ? 5 or more servings of fruits and vegetables each day. At each meal, try to fill half of your plate with fruits and vegetables. ? Up to 6-8 servings of whole grains each day. ? Less than 6 oz of lean meat, poultry, or fish each day. A 3-oz serving of meat is about the same size as a deck of cards. One egg equals 1 oz. ? 2 servings of low-fat dairy each day. ? A serving of nuts, seeds, or beans 5 times each week. ? Heart-healthy fats. Healthy fats called Omega-3 fatty acids are found in foods such as flaxseeds and coldwater fish, like sardines, salmon, and mackerel.  Limit how much you eat of the following: ? Canned or prepackaged foods. ? Food that is high in trans fat, such as fried foods. ? Food that is high in saturated fat, such as fatty meat. ? Sweets, desserts, sugary drinks, and other foods with added sugar. ? Full-fat dairy products.  Do not salt foods before eating.  Try to eat at least 2 vegetarian meals each week.  Eat more home-cooked food and less restaurant, buffet, and fast food.  When eating at a restaurant, ask that your food be prepared with less salt or no salt, if possible. What foods are recommended? The items listed may not be a complete list. Talk with your dietitian about what   dietary choices are best for you. Grains Whole-grain or whole-wheat bread. Whole-grain or whole-wheat pasta. Brown rice. Oatmeal. Quinoa. Bulgur. Whole-grain and low-sodium cereals. Pita bread. Low-fat, low-sodium crackers. Whole-wheat flour tortillas. Vegetables Fresh or frozen vegetables (raw, steamed, roasted, or grilled). Low-sodium or reduced-sodium tomato and vegetable juice. Low-sodium or reduced-sodium tomato sauce and tomato paste. Low-sodium or reduced-sodium canned vegetables. Fruits All fresh, dried, or frozen fruit. Canned fruit in natural juice (without  added sugar). Meat and other protein foods Skinless chicken or turkey. Ground chicken or turkey. Pork with fat trimmed off. Fish and seafood. Egg whites. Dried beans, peas, or lentils. Unsalted nuts, nut butters, and seeds. Unsalted canned beans. Lean cuts of beef with fat trimmed off. Low-sodium, lean deli meat. Dairy Low-fat (1%) or fat-free (skim) milk. Fat-free, low-fat, or reduced-fat cheeses. Nonfat, low-sodium ricotta or cottage cheese. Low-fat or nonfat yogurt. Low-fat, low-sodium cheese. Fats and oils Soft margarine without trans fats. Vegetable oil. Low-fat, reduced-fat, or light mayonnaise and salad dressings (reduced-sodium). Canola, safflower, olive, soybean, and sunflower oils. Avocado. Seasoning and other foods Herbs. Spices. Seasoning mixes without salt. Unsalted popcorn and pretzels. Fat-free sweets. What foods are not recommended? The items listed may not be a complete list. Talk with your dietitian about what dietary choices are best for you. Grains Baked goods made with fat, such as croissants, muffins, or some breads. Dry pasta or rice meal packs. Vegetables Creamed or fried vegetables. Vegetables in a cheese sauce. Regular canned vegetables (not low-sodium or reduced-sodium). Regular canned tomato sauce and paste (not low-sodium or reduced-sodium). Regular tomato and vegetable juice (not low-sodium or reduced-sodium). Pickles. Olives. Fruits Canned fruit in a light or heavy syrup. Fried fruit. Fruit in cream or butter sauce. Meat and other protein foods Fatty cuts of meat. Ribs. Fried meat. Bacon. Sausage. Bologna and other processed lunch meats. Salami. Fatback. Hotdogs. Bratwurst. Salted nuts and seeds. Canned beans with added salt. Canned or smoked fish. Whole eggs or egg yolks. Chicken or turkey with skin. Dairy Whole or 2% milk, cream, and half-and-half. Whole or full-fat cream cheese. Whole-fat or sweetened yogurt. Full-fat cheese. Nondairy creamers. Whipped toppings.  Processed cheese and cheese spreads. Fats and oils Butter. Stick margarine. Lard. Shortening. Ghee. Bacon fat. Tropical oils, such as coconut, palm kernel, or palm oil. Seasoning and other foods Salted popcorn and pretzels. Onion salt, garlic salt, seasoned salt, table salt, and sea salt. Worcestershire sauce. Tartar sauce. Barbecue sauce. Teriyaki sauce. Soy sauce, including reduced-sodium. Steak sauce. Canned and packaged gravies. Fish sauce. Oyster sauce. Cocktail sauce. Horseradish that you find on the shelf. Ketchup. Mustard. Meat flavorings and tenderizers. Bouillon cubes. Hot sauce and Tabasco sauce. Premade or packaged marinades. Premade or packaged taco seasonings. Relishes. Regular salad dressings. Where to find more information:  National Heart, Lung, and Blood Institute: www.nhlbi.nih.gov  American Heart Association: www.heart.org Summary  The DASH eating plan is a healthy eating plan that has been shown to reduce high blood pressure (hypertension). It may also reduce your risk for type 2 diabetes, heart disease, and stroke.  With the DASH eating plan, you should limit salt (sodium) intake to 2,300 mg a day. If you have hypertension, you may need to reduce your sodium intake to 1,500 mg a day.  When on the DASH eating plan, aim to eat more fresh fruits and vegetables, whole grains, lean proteins, low-fat dairy, and heart-healthy fats.  Work with your health care provider or diet and nutrition specialist (dietitian) to adjust your eating plan to your individual   calorie needs. This information is not intended to replace advice given to you by your health care provider. Make sure you discuss any questions you have with your health care provider. Document Released: 06/14/2011 Document Revised: 06/18/2016 Document Reviewed: 06/18/2016 Elsevier Interactive Patient Education  2018 Elsevier Inc.  

## 2017-12-10 NOTE — Telephone Encounter (Signed)
[facility] - Patient asked if I couldn callm after 2:30pm becausen she is goint to a graduation ceremony. .  

## 2017-12-13 ENCOUNTER — Encounter: Payer: Self-pay | Admitting: Family Medicine

## 2017-12-19 NOTE — Progress Notes (Deleted)
[facility] MD/PA/NP OP Progress Note  12/19/2017 11:57 AM TIYE JIN  MRN:  [id number]  Chief Complaint:  HPI:  - Per chart review, [facility] is called given patient voiced vague interest in psychiatry admission and also non adherence to treatment.     Visit Diagnosis: No diagnosis found.  Past Psychiatric History:  Outpatient:  Psychiatry admission:  Previous suicide attempt:  Past trials of medication:  History of violence:   Past Medical History:  Past Medical History:  Diagnosis Date  . Anemia   . CIN II (cervical intraepithelial neoplasia II) 2008   Colpo/Leep   . Depression   . STD (sexually transmitted disease)    Trichomonas,     Past Surgical History:  Procedure Laterality Date  . CHOLECYSTECTOMY N/A 09/25/2013   Procedure: LAPAROSCOPIC CHOLECYSTECTOMY;  Surgeon: Jamesetta So, MD;  Location: [facility];  Service: General;  Laterality: N/A;  . DILATION AND CURETTAGE OF UTERUS N/A 07/09/1998   and 02/07/2003  . ESOPHAGOGASTRODUODENOSCOPY N/A 09/15/2013   Procedure: ESOPHAGOGASTRODUODENOSCOPY (EGD);  Surgeon: Danie Binder, MD;  Location: [facility];  Service: Endoscopy;  Laterality: N/A;  11:30-moved to [place] notified pt  . LAPAROSCOPIC BILATERAL SALPINGECTOMY Bilateral 11/10/2014   Procedure: LAPAROSCOPIC BILATERAL SALPINGECTOMY;  Surgeon: Florian Buff, MD;  Location: [facility];  Service: Gynecology;  Laterality: Bilateral;  . TUBAL LIGATION      Family Psychiatric History: ***  Family History:  Family History  Problem Relation Age of Onset  . Hypertension Mother   . Alcohol abuse Mother   . Depression Mother   . Stomach cancer Father        stomach   . Hypertension Brother   . Alcohol abuse Brother   . ADD / ADHD Son   . Anxiety disorder Brother   . Depression Brother   . Bipolar disorder Sister   . Diabetes Maternal Grandmother   . Drug abuse Neg Hx   . Dementia Neg Hx   . OCD Neg Hx   . Paranoid behavior Neg Hx   . Schizophrenia Neg Hx   .  Seizures Neg Hx   . Sexual abuse Neg Hx   . Physical abuse Neg Hx   . Colon cancer Neg Hx     Social History:  Social History   Socioeconomic History  . Marital status: Single    Spouse name: Not on file  . Number of children: Not on file  . Years of education: Not on file  . Highest education level: Not on file  Occupational History  . Occupation: [employer]  . Financial resource strain: Not on file  . Food insecurity:    Worry: Not on file    Inability: Not on file  . Transportation needs:    Medical: Not on file    Non-medical: Not on file  Tobacco Use  . Smoking status: Never Smoker  . Smokeless tobacco: Never Used  Substance and Sexual Activity  . Alcohol use: No    Frequency: Never    Comment: Drinks occasionally - glass of wine  . Drug use: No  . Sexual activity: Not Currently    Birth control/protection: Patch, Surgical  Lifestyle  . Physical activity:    Days per week: 0 days    Minutes per session: 0 min  . Stress: Very much  Relationships  . Social connections:    Talks on phone: Not on file    Gets together: Not on file  Attends religious service: Not on file    Active member of club or organization: Not on file    Attends meetings of clubs or organizations: Not on file    Relationship status: Not on file  Other Topics Concern  . Not on file  Social History Narrative   In school for medical office/nursing.    Live in [place].   Has 3 children, 17, 108, 11 ages.    No tobacco use.    Works 7 days a week.    Allergies:  Allergies  Allergen Reactions  . Macrobid [Nitrofurantoin Macrocrystal] Itching    Metabolic Disorder Labs: Lab Results  Component Value Date   HGBA1C 5.9 (H) 11/25/2017   MPG 123 11/25/2017   MPG 114 09/21/2016   No results found for: PROLACTIN Lab Results  Component Value Date   CHOL 144 11/25/2017   TRIG 83 11/25/2017   HDL 57 11/25/2017   CHOLHDL 2.5 11/25/2017   VLDL 15 09/21/2016   LDLCALC 70  11/25/2017   LDLCALC 78 09/21/2016   Lab Results  Component Value Date   TSH 1.34 11/25/2017   TSH 1.21 09/21/2016    Therapeutic Level Labs: No results found for: LITHIUM No results found for: VALPROATE No components found for:  CBMZ  Current Medications: Current Outpatient Medications  Medication Sig Dispense Refill  . clindamycin-benzoyl peroxide (BENZACLIN) gel Apply topically 2 (two) times daily. 25 g 3  . clotrimazole-betamethasone (LOTRISONE) cream Apply 1 application topically 2 (two) times daily. 30 g 3  . mirtazapine (REMERON) 30 MG tablet Take 1 tablet (30 mg total) by mouth at bedtime. 30 tablet 0  . norelgestromin-ethinyl estradiol Marilu Favre) 150-35 MCG/24HR transdermal patch Place 1 patch onto the skin once a week. 9 patch 6  . Vitamin D, Ergocalciferol, (DRISDOL) 50000 units CAPS capsule Take 1 capsule (50,000 Units total) by mouth every 7 (seven) days. 12 capsule 0   No current facility-administered medications for this visit.      Musculoskeletal: Strength & Muscle Tone: within normal limits Gait & Station: normal Patient leans: N/A  Psychiatric Specialty Exam: ROS  There were no vitals taken for this visit.There is no height or weight on file to calculate BMI.  General Appearance: Fairly Groomed  Eye Contact:  Good  Speech:  Clear and Coherent  Volume:  Normal  Mood:  {BHH MOOD:22306}  Affect:  {Affect (PAA):22687}  Thought Process:  Coherent  Orientation:  Full (Time, Place, and Person)  Thought Content: Logical   Suicidal Thoughts:  {ST/HT (PAA):22692}  Homicidal Thoughts:  {ST/HT (PAA):22692}  Memory:  Immediate;   Good  Judgement:  {Judgement (PAA):22694}  Insight:  {Insight (PAA):22695}  Psychomotor Activity:  Normal  Concentration:  Concentration: Good and Attention Span: Good  Recall:  Good  Fund of Knowledge: Good  Language: Good  Akathisia:  No  Handed:  Right  AIMS (if indicated): not done  Assets:  Communication Skills Desire for  Improvement  ADL's:  Intact  Cognition: WNL  Sleep:  {BHH GOOD/FAIR/POOR:22877}   Screenings: GAD-7     Office Visit from 12/10/2017 in [facility]  Total GAD-7 Score  19    PHQ2-9     Office Visit from 12/10/2017 in [facility] Office Visit from 11/14/2017 in [facility] Office Visit from 10/23/2017 in [facility] Office Visit from 07/12/2017 in [facility] Office Visit from 06/12/2017 in [facility]  PHQ-2 Total Score  '6  2  2  '$ 1  0  PHQ-9 Total Score  '16  9  3  '$ --  --       Assessment and Plan:  MARCHEL DRUM is a 37 y.o. year old female with a history of depression, anxiety, bipolar disorder per chart, who presents for follow up appointment for No diagnosis found.   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   # Generalized anxiety disorder # MDD, moderate, recurrent without psychotic features # r/o PTSD Patient endorses anxiety and neurovegetative symptoms, which she has been experiencing for the past several years. Will uptitrate mirtazapine to target mood symptoms, insomnia and appetite loss. Discussed risk of oversedation and weight gain, although it is expected to be less on higher dose. Noted that she does have negative appraisal of trauma, secondary to the previous relationship with the father of her children.  although she will greatly benefit from CBT, she is not interested in this option.  We will continue to discuss as needed.   # Inattention It is multifactorial given her mood symptoms. She has no known history of ADHD. She agrees to prioritize management of her mood symptoms.    Plan 1. Increase mirtazapine 30 mg at night  2. Return to clinic in one month for 30 mins 3. Obtain record from [facility]  The patient demonstrates the following risk factors for suicide: Chronic risk factors for suicide include: psychiatric disorder of anxiety. Acute risk factors for suicide include: family or  marital conflict. Protective factors for this patient include: responsibility to others (children, family), coping skills and hope for the future. Considering these factors, the overall suicide risk at this point appears to be low. Patient is appropriate for outpatient follow up.    Norman Clay, MD 12/19/2017, 11:57 AM

## 2017-12-24 ENCOUNTER — Ambulatory Visit (HOSPITAL_COMMUNITY): Payer: BLUE CROSS/BLUE SHIELD | Admitting: Psychiatry

## 2017-12-31 ENCOUNTER — Telehealth: Payer: Self-pay

## 2017-12-31 NOTE — Telephone Encounter (Signed)
[facility] - Inactive list - due to receiving face to face appts with Dr. Vanetta ShawlHisada.  Routed information ton PCP and Dr. Vanetta ShawlHisada

## 2018-01-03 ENCOUNTER — Telehealth: Payer: Self-pay | Admitting: Family Medicine

## 2018-01-03 NOTE — Telephone Encounter (Signed)
[facility] Janine Limboachel H. Tracie HarrierHagler, MD

## 2018-01-03 NOTE — Telephone Encounter (Signed)
Spoke with patient and advised her of Dr.Hagler's recommendations with verbal understanding.  

## 2018-01-03 NOTE — Telephone Encounter (Signed)
Patient states she is having lower back pain, she thinks it is coming from her kidneys. No available appts until august. Cb# [phone]

## 2018-01-17 ENCOUNTER — Ambulatory Visit: Payer: Self-pay | Admitting: Family Medicine

## 2018-02-04 ENCOUNTER — Other Ambulatory Visit: Payer: Self-pay | Admitting: Family Medicine

## 2018-02-10 ENCOUNTER — Other Ambulatory Visit: Payer: Self-pay | Admitting: Women's Health

## 2018-04-29 ENCOUNTER — Ambulatory Visit: Payer: BLUE CROSS/BLUE SHIELD | Admitting: Advanced Practice Midwife

## 2018-07-08 ENCOUNTER — Ambulatory Visit: Payer: BLUE CROSS/BLUE SHIELD | Admitting: Adult Health

## 2020-09-07 ENCOUNTER — Other Ambulatory Visit: Payer: BLUE CROSS/BLUE SHIELD

## 2020-10-10 ENCOUNTER — Other Ambulatory Visit (HOSPITAL_COMMUNITY)
Admission: RE | Admit: 2020-10-10 | Discharge: 2020-10-10 | Disposition: A | Discharge status: Home / Self Care | Payer: 59 | Source: Ambulatory Visit | Attending: Obstetrics & Gynecology | Admitting: Obstetrics & Gynecology

## 2020-10-10 ENCOUNTER — Other Ambulatory Visit: Payer: Self-pay

## 2020-10-10 ENCOUNTER — Other Ambulatory Visit (INDEPENDENT_AMBULATORY_CARE_PROVIDER_SITE_OTHER): Payer: 59 | Admitting: *Deleted

## 2020-10-10 DIAGNOSIS — Z113 Encounter for screening for infections with a predominantly sexual mode of transmission: Secondary | ICD-10-CM

## 2020-10-10 NOTE — Progress Notes (Signed)
   NURSE VISIT- VAGINITIS/STD/POC  SUBJECTIVE:  Robin Delgado is a 40 y.o. G3P0 GYN patientfemale here for a vaginal swab for STD screen.  She reports the following symptoms: none for 0 days. Denies abnormal vaginal bleeding, significant pelvic pain, fever, or UTI symptoms.  OBJECTIVE:  There were no vitals taken for this visit.  Appears well, in no apparent distress  ASSESSMENT: Vaginal swab for STD screen  PLAN: Self-collected vaginal probe for Gonorrhea, Chlamydia, Trichomonas, Bacterial Vaginosis, Yeast sent to lab Treatment: to be determined once results are received Follow-up as needed if symptoms persist/worsen, or new symptoms develop  Annamarie Dawley  10/10/2020 9:30 AM

## 2020-10-10 NOTE — Progress Notes (Signed)
Chart reviewed for nurse visit. Agree with plan of care.  Adline Potter, NP 10/10/2020 9:41 AM

## 2020-10-11 ENCOUNTER — Telehealth: Payer: Self-pay | Admitting: Adult Health

## 2020-10-11 ENCOUNTER — Telehealth: Payer: Self-pay | Admitting: *Deleted

## 2020-10-11 ENCOUNTER — Other Ambulatory Visit: Payer: Self-pay | Admitting: Adult Health

## 2020-10-11 LAB — CERVICOVAGINAL ANCILLARY ONLY
Bacterial Vaginitis (gardnerella): POSITIVE — AB
Candida Glabrata: NEGATIVE
Candida Vaginitis: NEGATIVE
Chlamydia: NEGATIVE
Comment: NEGATIVE
Comment: NEGATIVE
Comment: NEGATIVE
Comment: NEGATIVE
Comment: NEGATIVE
Comment: NORMAL
Neisseria Gonorrhea: NEGATIVE
Trichomonas: NEGATIVE

## 2020-10-11 MED ORDER — METRONIDAZOLE 0.75 % VA GEL: 1.0000 | Freq: Every day | VAGINAL | 0 refills | Status: DC

## 2020-10-11 MED ORDER — METRONIDAZOLE 500 MG PO TABS: 500.0000 mg | ORAL_TABLET | Freq: Two times a day (BID) | ORAL | 0 refills | Status: DC

## 2020-10-11 NOTE — Telephone Encounter (Signed)
Pt states that rx was sent to [facility] and she doesn't use that pharmacy. Would like it sent to [facility]. I called and gave verbal order per original rx.

## 2020-10-11 NOTE — Telephone Encounter (Signed)
Left message that metrogel sent

## 2020-10-11 NOTE — Telephone Encounter (Signed)
Pt prefers Metrogel, please order at [place] call pt when done (she doesn't use MyChart & has not logged in since 2019)

## 2020-10-11 NOTE — Progress Notes (Signed)
+  BV on vaginal swab, will rx flagyl 

## 2020-11-29 ENCOUNTER — Encounter: Payer: Self-pay | Admitting: Adult Health

## 2020-11-29 ENCOUNTER — Other Ambulatory Visit (HOSPITAL_COMMUNITY)
Admission: RE | Admit: 2020-11-29 | Discharge: 2020-11-29 | Disposition: A | Discharge status: Home / Self Care | Payer: 59 | Source: Ambulatory Visit | Attending: Adult Health | Admitting: Adult Health

## 2020-11-29 ENCOUNTER — Other Ambulatory Visit: Payer: Self-pay

## 2020-11-29 ENCOUNTER — Ambulatory Visit (INDEPENDENT_AMBULATORY_CARE_PROVIDER_SITE_OTHER): Payer: 59 | Admitting: Adult Health

## 2020-11-29 VITALS — BP 147/87 | HR 67 | Ht 63.0 in | Wt 149.5 lb

## 2020-11-29 DIAGNOSIS — Z01419 Encounter for gynecological examination (general) (routine) without abnormal findings: Secondary | ICD-10-CM | POA: Insufficient documentation

## 2020-11-29 DIAGNOSIS — Z1231 Encounter for screening mammogram for malignant neoplasm of breast: Secondary | ICD-10-CM

## 2020-11-29 DIAGNOSIS — Z7689 Persons encountering health services in other specified circumstances: Secondary | ICD-10-CM

## 2020-11-29 DIAGNOSIS — Z1211 Encounter for screening for malignant neoplasm of colon: Secondary | ICD-10-CM

## 2020-11-29 DIAGNOSIS — F419 Anxiety disorder, unspecified: Secondary | ICD-10-CM | POA: Insufficient documentation

## 2020-11-29 LAB — HEMOCCULT GUIAC POC 1CARD (OFFICE): Fecal Occult Blood, POC: NEGATIVE

## 2020-11-29 MED ORDER — BUSPIRONE HCL 5 MG PO TABS: 5.0000 mg | ORAL_TABLET | Freq: Three times a day (TID) | ORAL | 6 refills | Status: DC

## 2020-11-29 MED ORDER — XULANE 150-35 MCG/24HR TD PTWK: MEDICATED_PATCH | TRANSDERMAL | 3 refills | Status: DC

## 2020-11-29 MED ORDER — METRONIDAZOLE 0.75 % VA GEL: 1.0000 | Freq: Every day | VAGINAL | 0 refills | Status: DC

## 2020-11-29 NOTE — Progress Notes (Signed)
Patient ID: Robin Delgado, female   DOB: 12/16/1980, 40 y.o.   MRN: [id number] History of Present Illness: Robin Delgado is a 40 year old black female,single, G3P3 in for a well woman gyn exam and pap. PCP is [facility].   Current Medications, Allergies, Past Medical History, Past Surgical History, Family History and Social History were reviewed in [employer] record.     Review of Systems: Patient denies any headaches, hearing loss, fatigue, blurred vision, shortness of breath, chest pain, abdominal pain, problems with bowel movements, urination, or intercourse. No joint pain or mood swings. She hates a period and uses xulane patches to control her period     Physical Exam:BP (!) 147/87 (BP Location: Left Arm, Patient Position: Sitting, Cuff Size: Normal)   Pulse 67   Ht 5\' 3"  (1.6 m)   Wt 149 lb 8 oz (67.8 kg)   LMP 11/01/2020 (Approximate)   BMI 26.48 kg/m  General:  Well developed, well nourished, no acute distress Skin:  Warm and dry Neck:  Midline trachea, normal thyroid, good ROM, no lymphadenopathy Lungs; Clear to auscultation bilaterally Breast:  No dominant palpable mass, retraction, or nipple discharge Cardiovascular: Regular rate and rhythm Abdomen:  Soft, non tender, no hepatosplenomegaly Pelvic:  External genitalia is normal in appearance, no lesions.  The vagina is normal in appearance. Urethra has no lesions or masses. The cervix is bulbous. Pap with GC/CHL and HR HPV genotyping performed. Uterus is felt to be normal size, shape, and contour.  No adnexal masses or tenderness noted.Bladder is non tender, no masses felt. Rectal: Good sphincter tone, no polyps, or hemorrhoids felt.  Hemoccult negative. Extremities/musculoskeletal:  No swelling or varicosities noted, no clubbing or cyanosis Psych:  No mood changes, alert and cooperative,seems happy AA is 0 Fall risk is low Depression screen [facility] 2/9 11/29/2020 12/10/2017 11/14/2017  Decreased Interest 1 3 1    Down, Depressed, Hopeless 2 3 1   PHQ - 2 Score 3 6 2   Altered sleeping 1 2 2   Tired, decreased energy 3 3 3   Change in appetite 1 2 2   Feeling bad or failure about yourself  0 1 0  Trouble concentrating 1 2 0  Moving slowly or fidgety/restless 1 0 0  Suicidal thoughts 0 0 0  PHQ-9 Score 10 16 9   Difficult doing work/chores - Somewhat difficult Somewhat difficult  Some recent data might be hidden   GAD 7 : Generalized Anxiety Score 11/29/2020 12/10/2017  Nervous, Anxious, on Edge 3 3  Control/stop worrying 3 3  Worry too much - different things 3 3  Trouble relaxing 3 3  Restless 0 2  Easily annoyed or irritable 2 3  Afraid - awful might happen 3 2  Total GAD 7 Score 17 19  Anxiety Difficulty - Somewhat difficult    Upstream - 11/29/20 1039      Pregnancy Intention Screening   Does the patient want to become pregnant in the next year? No    Does the patient's partner want to become pregnant in the next year? No    Would the patient like to discuss contraceptive options today? No      Contraception Wrap Up   Current Method Female Sterilization    End Method Female Sterilization    Contraception Counseling Provided No          Examination chaperoned by LPN  Impression and Plan: 1. Encounter for gynecological examination with Papanicolaou smear of cervix Pap sent Physical in 1  year Pap in 3 if normal Had labs at PCP She requests refill on metrogel   2. Encounter for menstrual regulation Will refill xulane, and gave handout on endometrial ablation  3. Encounter for screening fecal occult blood testing   4. Screening mammogram for breast cancer Mammogram scheduled for her 12/02/20  [facility] at 11:30 am   5. Anxiety Will try buspar Follow up in 3 months or sooner if needed    Meds ordered this encounter  Medications  . norelgestromin-ethinyl estradiol Burr Medico) 150-35 MCG/24HR transdermal patch    Sig: APPLY 1 PATCH ONCE EVERY WEEK AS DIRECTED     Dispense:  9 patch    Refill:  3    Order Specific Question:   Supervising Provider    Answer:   EURE, LUTHER H [[id number]]  . metroNIDAZOLE (METROGEL VAGINAL) 0.75 % vaginal gel    Sig: Place 1 Applicatorful vaginally at bedtime.    Dispense:  70 g    Refill:  0    Order Specific Question:   Supervising Provider    Answer:   EURE, LUTHER H [[id number]]  . busPIRone (BUSPAR) 5 MG tablet    Sig: Take 1 tablet (5 mg total) by mouth 3 (three) times daily.    Dispense:  90 tablet    Refill:  6    Order Specific Question:   Supervising Provider    Answer:   Duane Lope H [[id number]]

## 2020-12-02 ENCOUNTER — Ambulatory Visit (HOSPITAL_COMMUNITY)
Admission: RE | Admit: 2020-12-02 | Discharge: 2020-12-02 | Disposition: A | Discharge status: Home / Self Care | Payer: 59 | Source: Ambulatory Visit | Attending: Adult Health | Admitting: Adult Health

## 2020-12-02 ENCOUNTER — Other Ambulatory Visit: Payer: Self-pay

## 2020-12-02 DIAGNOSIS — Z1231 Encounter for screening mammogram for malignant neoplasm of breast: Secondary | ICD-10-CM | POA: Diagnosis present

## 2020-12-02 LAB — CYTOLOGY - PAP
Adequacy: ABSENT
Chlamydia: NEGATIVE
Comment: NEGATIVE
Comment: NEGATIVE
Comment: NORMAL
Diagnosis: NEGATIVE
Diagnosis: REACTIVE
High risk HPV: NEGATIVE
Neisseria Gonorrhea: NEGATIVE

## 2020-12-07 ENCOUNTER — Other Ambulatory Visit (HOSPITAL_COMMUNITY): Payer: Self-pay | Admitting: Adult Health

## 2020-12-07 ENCOUNTER — Telehealth: Payer: Self-pay | Admitting: Adult Health

## 2020-12-07 ENCOUNTER — Other Ambulatory Visit: Payer: Self-pay | Admitting: Adult Health

## 2020-12-07 DIAGNOSIS — R928 Other abnormal and inconclusive findings on diagnostic imaging of breast: Secondary | ICD-10-CM

## 2020-12-07 NOTE — Telephone Encounter (Signed)
Pt aware that mammogram was abnormal needs follow up on left breast, she will call them for appt

## 2020-12-13 ENCOUNTER — Other Ambulatory Visit: Payer: Self-pay

## 2020-12-13 ENCOUNTER — Ambulatory Visit (HOSPITAL_COMMUNITY)
Admission: RE | Admit: 2020-12-13 | Discharge: 2020-12-13 | Disposition: A | Discharge status: Home / Self Care | Payer: 59 | Source: Ambulatory Visit | Attending: Adult Health | Admitting: Adult Health

## 2020-12-13 DIAGNOSIS — R928 Other abnormal and inconclusive findings on diagnostic imaging of breast: Secondary | ICD-10-CM | POA: Diagnosis present

## 2021-03-01 ENCOUNTER — Ambulatory Visit: Payer: 59 | Admitting: Adult Health

## 2021-04-05 ENCOUNTER — Emergency Department (HOSPITAL_COMMUNITY)
Admission: EM | Admit: 2021-04-05 | Discharge: 2021-04-05 | Disposition: A | Discharge status: Home / Self Care | Payer: 59 | Attending: Emergency Medicine | Admitting: Emergency Medicine

## 2021-04-05 ENCOUNTER — Other Ambulatory Visit: Payer: Self-pay

## 2021-04-05 ENCOUNTER — Encounter (HOSPITAL_COMMUNITY): Payer: Self-pay | Admitting: Emergency Medicine

## 2021-04-05 DIAGNOSIS — R112 Nausea with vomiting, unspecified: Secondary | ICD-10-CM

## 2021-04-05 DIAGNOSIS — U071 COVID-19: Secondary | ICD-10-CM | POA: Insufficient documentation

## 2021-04-05 DIAGNOSIS — M791 Myalgia, unspecified site: Secondary | ICD-10-CM

## 2021-04-05 DIAGNOSIS — J029 Acute pharyngitis, unspecified: Secondary | ICD-10-CM | POA: Diagnosis present

## 2021-04-05 DIAGNOSIS — Z20822 Contact with and (suspected) exposure to covid-19: Secondary | ICD-10-CM

## 2021-04-05 LAB — RESP PANEL BY RT-PCR (FLU A&B, COVID) ARPGX2
Influenza A by PCR: NEGATIVE
Influenza B by PCR: NEGATIVE
SARS Coronavirus 2 by RT PCR: POSITIVE — AB

## 2021-04-05 MED ORDER — ONDANSETRON 8 MG PO TBDP
8.0000 mg | ORAL_TABLET | Freq: Once | ORAL | Status: AC
  Administered 2021-04-05: 8 mg via ORAL
  Filled 2021-04-05: qty 1

## 2021-04-05 MED ORDER — ONDANSETRON HCL 4 MG PO TABS: 4.0000 mg | ORAL_TABLET | Freq: Three times a day (TID) | ORAL | 0 refills | Status: DC | PRN

## 2021-04-05 NOTE — ED Triage Notes (Signed)
Pt to the ED with a covid exposure last Thursday. Pt took an at home test that was negative.

## 2021-04-05 NOTE — ED Provider Notes (Signed)
[facility] EMERGENCY DEPARTMENT Provider Note   CSN: [id number] Arrival date & time: 04/05/21  1147     History Chief Complaint  Patient presents with   Covid Exposure    Robin Delgado is a 40 y.o. female.  She is care with complaint of 1 day of body aches nausea vomited x1 sore throat in the setting of multiple contacts at home positive for COVID.  She tested herself at home and was negative.  Continues to have symptoms.  Feels better after taking some ibuprofen.  Is going to need a note for work if she test positive for COVID.  She is COVID vaccinated and boosted x1  The history is provided by the patient.  Influenza Presenting symptoms: fatigue, fever, myalgias, nausea, sore throat and vomiting   Presenting symptoms: no headaches and no shortness of breath   Myalgias:    Location:  Generalized   Quality:  Aching   Severity:  Moderate   Onset quality:  Gradual   Duration:  2 days   Timing:  Constant   Progression:  Improving Associated symptoms: no mental status change and no neck stiffness   Risk factors: sick contacts       Past Medical History:  Diagnosis Date   Anemia    CIN II (cervical intraepithelial neoplasia II) 2008   Colpo/Leep    Depression    STD (sexually transmitted disease)    Trichomonas,    Vaginal Pap smear, abnormal     Patient Active Problem List   Diagnosis Date Noted   Encounter for gynecological examination with Papanicolaou smear of cervix 11/29/2020   Encounter for menstrual regulation 11/29/2020   Encounter for screening fecal occult blood testing 11/29/2020   Screening mammogram for breast cancer 11/29/2020   Anxiety 11/29/2020   Moderate episode of recurrent major depressive disorder (HCC) 11/25/2017   Acne vulgaris 06/12/2017   Fatigue 06/12/2017   Anemia 06/12/2017   BV (bacterial vaginosis) 03/14/2013   Anemia, iron deficiency 11/11/2012   Generalized anxiety disorder 09/17/2012   Insomnia 08/13/2012    Past  Surgical History:  Procedure Laterality Date   CHOLECYSTECTOMY N/A 09/25/2013   Procedure: LAPAROSCOPIC CHOLECYSTECTOMY;  Surgeon: Dalia Heading, MD;  Location: [facility];  Service: General;  Laterality: N/A;   DILATION AND CURETTAGE OF UTERUS N/A 07/09/1998   and 02/07/2003   ESOPHAGOGASTRODUODENOSCOPY N/A 09/15/2013   Procedure: ESOPHAGOGASTRODUODENOSCOPY (EGD);  Surgeon: West Bali, MD;  Location: [facility];  Service: Endoscopy;  Laterality: N/A;  11:30-moved to 1030 [address] [place] notified pt   LAPAROSCOPIC BILATERAL SALPINGECTOMY Bilateral 11/10/2014   Procedure: LAPAROSCOPIC BILATERAL SALPINGECTOMY;  Surgeon: Lazaro Arms, MD;  Location: [facility];  Service: Gynecology;  Laterality: Bilateral;   TUBAL LIGATION       OB History     Gravida  3   Para  3   Term      Preterm      AB      Living  3      SAB      IAB      Ectopic      Multiple      Live Births              Family History  Problem Relation Age of Onset   Hypertension Mother    Alcohol abuse Mother    Depression Mother    Stomach cancer Father        stomach    Hypertension Brother  Alcohol abuse Brother    ADD / ADHD Son    Anxiety disorder Brother    Depression Brother    Bipolar disorder Sister    Diabetes Maternal Grandmother    Drug abuse Neg Hx    Dementia Neg Hx    OCD Neg Hx    Paranoid behavior Neg Hx    Schizophrenia Neg Hx    Seizures Neg Hx    Sexual abuse Neg Hx    Physical abuse Neg Hx    Colon cancer Neg Hx     Social History   Tobacco Use   Smoking status: Never   Smokeless tobacco: Never  Vaping Use   Vaping Use: Never used  Substance Use Topics   Alcohol use: No   Drug use: No    Home Medications Prior to Admission medications   Medication Sig Start Date End Date Taking? Authorizing Provider  busPIRone (BUSPAR) 5 MG tablet Take 1 tablet (5 mg total) by mouth 3 (three) times daily. 11/29/20   Adline Potter, NP  metroNIDAZOLE (METROGEL VAGINAL) 0.75 %  vaginal gel Place 1 Applicatorful vaginally at bedtime. 11/29/20   Adline Potter, NP  norelgestromin-ethinyl estradiol [facility]) 150-35 MCG/24HR transdermal patch APPLY 1 PATCH ONCE EVERY WEEK AS DIRECTED 11/29/20   Adline Potter, NP    Allergies    Macrobid [nitrofurantoin macrocrystal]  Review of Systems   Review of Systems  Constitutional:  Positive for fatigue and fever.  HENT:  Positive for sore throat.   Eyes:  Negative for visual disturbance.  Respiratory:  Negative for shortness of breath.   Cardiovascular:  Negative for chest pain.  Gastrointestinal:  Positive for nausea and vomiting.  Genitourinary:  Negative for dysuria.  Musculoskeletal:  Positive for myalgias. Negative for neck stiffness.  Skin:  Negative for rash.  Neurological:  Negative for headaches.   Physical Exam Updated Vital Signs BP (!) 166/94 (BP Location: Right Arm)   Pulse (!) 108   Temp 99 F (37.2 C)   Resp 18   Ht 5\' 3"  (1.6 m)   Wt 67.8 kg   LMP  (LMP Unknown)   SpO2 99%   BMI 26.48 kg/m   Physical Exam Vitals and nursing note reviewed.  Constitutional:      General: She is not in acute distress.    Appearance: Normal appearance. She is well-developed.  HENT:     Head: Normocephalic and atraumatic.  Eyes:     Conjunctiva/sclera: Conjunctivae normal.  Cardiovascular:     Rate and Rhythm: Normal rate and regular rhythm.     Heart sounds: No murmur heard. Pulmonary:     Effort: Pulmonary effort is normal. No respiratory distress.     Breath sounds: Normal breath sounds.  Abdominal:     Palpations: Abdomen is soft.     Tenderness: There is no abdominal tenderness. There is no guarding or rebound.  Musculoskeletal:        General: No deformity or signs of injury. Normal range of motion.     Cervical back: Neck supple.  Skin:    General: Skin is warm and dry.  Neurological:     General: No focal deficit present.     Mental Status: She is alert.     Gait: Gait normal.     ED Results / Procedures / Treatments   Labs (all labs ordered are listed, but only abnormal results are displayed) Labs Reviewed  RESP PANEL BY RT-PCR (FLU A&B, COVID) ARPGX2  EKG None  Radiology No results found.  Procedures Procedures   Medications Ordered in ED Medications - No data to display  ED Course  I have reviewed the triage vital signs and the nursing notes.  Pertinent labs & imaging results that were available during my care of the patient were reviewed by me and considered in my medical decision making (see chart for details).    MDM Rules/Calculators/A&P                          Robin Delgado was evaluated in Emergency Department on 04/05/2021 for the symptoms described in the history of present illness. She was evaluated in the context of the global COVID-19 pandemic, which necessitated consideration that the patient might be at risk for infection with the SARS-CoV-2 virus that causes COVID-19. Institutional protocols and algorithms that pertain to the evaluation of patients at risk for COVID-19 are in a state of rapid change based on information released by regulatory bodies including the CDC and federal and state organizations. These policies and algorithms were followed during the patient's care in the ED.   Final Clinical Impression(s) / ED Diagnoses Final diagnoses:  Myalgia  Non-intractable vomiting with nausea, unspecified vomiting type  Person under investigation for COVID-19    Rx / DC Orders ED Discharge Orders          Ordered    ondansetron (ZOFRAN) 4 MG tablet  Every 8 hours PRN        04/05/21 1208             Terrilee Files, MD 04/05/21 1739

## 2021-04-05 NOTE — Discharge Instructions (Signed)
You were seen in the emergency department for body aches nausea vomiting after COVID exposure.  You had a COVID and flu swab that is pending at time of discharge.  You can follow-up your results on MyChart.  If you are positive for COVID you will need to isolate for up to 10 days from the beginning of your symptoms.  Drink plenty of fluids, Tylenol and ibuprofen for fever and pain.  We are prescribing some nausea medication.  Follow-up with your doctor.  Return to the emergency department if any worsening or concerning symptoms

## 2021-05-01 ENCOUNTER — Other Ambulatory Visit (HOSPITAL_COMMUNITY): Payer: Self-pay | Admitting: Adult Health

## 2021-05-01 DIAGNOSIS — R928 Other abnormal and inconclusive findings on diagnostic imaging of breast: Secondary | ICD-10-CM

## 2021-05-12 ENCOUNTER — Other Ambulatory Visit: Payer: Self-pay | Admitting: Adult Health

## 2021-05-12 MED ORDER — METRONIDAZOLE 0.75 % VA GEL: 1.0000 | Freq: Every day | VAGINAL | 0 refills | Status: DC

## 2021-05-12 NOTE — Progress Notes (Signed)
Refill metrogel  °

## 2021-06-20 ENCOUNTER — Other Ambulatory Visit: Payer: Self-pay

## 2021-06-20 ENCOUNTER — Ambulatory Visit (HOSPITAL_COMMUNITY)
Admission: RE | Admit: 2021-06-20 | Discharge: 2021-06-20 | Disposition: A | Discharge status: Home / Self Care | Payer: 59 | Source: Ambulatory Visit | Attending: Adult Health | Admitting: Adult Health

## 2021-06-20 DIAGNOSIS — R928 Other abnormal and inconclusive findings on diagnostic imaging of breast: Secondary | ICD-10-CM | POA: Insufficient documentation

## 2021-07-21 ENCOUNTER — Other Ambulatory Visit (INDEPENDENT_AMBULATORY_CARE_PROVIDER_SITE_OTHER): Payer: 59

## 2021-07-21 ENCOUNTER — Other Ambulatory Visit (HOSPITAL_COMMUNITY)
Admission: RE | Admit: 2021-07-21 | Discharge: 2021-07-21 | Disposition: A | Discharge status: Home / Self Care | Payer: 59 | Source: Ambulatory Visit | Attending: Obstetrics & Gynecology | Admitting: Obstetrics & Gynecology

## 2021-07-21 DIAGNOSIS — R399 Unspecified symptoms and signs involving the genitourinary system: Secondary | ICD-10-CM | POA: Diagnosis not present

## 2021-07-21 DIAGNOSIS — N923 Ovulation bleeding: Secondary | ICD-10-CM | POA: Diagnosis present

## 2021-07-21 DIAGNOSIS — Z113 Encounter for screening for infections with a predominantly sexual mode of transmission: Secondary | ICD-10-CM | POA: Diagnosis present

## 2021-07-21 NOTE — Progress Notes (Addendum)
° °  NURSE VISIT- VAGINITIS/STD  SUBJECTIVE:  Robin Delgado is a 41 y.o. G3P3 GYN patientfemale here for a vaginal swab for vaginitis screening, STD screen.  She reports the following symptoms: abnormal bleeding: regular every 28 days without intermenstrual spotting, odor, and urinary symptoms of flank pain on the left for 3 days. Denies abnormal vaginal bleeding, significant pelvic pain, fever, or UTI symptoms.  OBJECTIVE:  There were no vitals taken for this visit.  Appears well, in no apparent distress  ASSESSMENT: Vaginal swab for  vaginitis & STD screening  PLAN: Self-collected vaginal probe for Gonorrhea, Chlamydia, Trichomonas, Bacterial Vaginosis, Yeast sent to lab Urine to be sent for culture Treatment: to be determined once results are received Follow-up as needed if symptoms persist/worsen, or new symptoms develop  Robin Delgado A Gage Treiber  07/21/2021 12:28 PM  Chart reviewed for nurse visit. Agree with plan of care.  Estill Dooms, NP 07/21/2021 1:42 PM

## 2021-07-22 LAB — URINALYSIS, ROUTINE W REFLEX MICROSCOPIC
Bilirubin, UA: NEGATIVE
Glucose, UA: NEGATIVE
Ketones, UA: NEGATIVE
Nitrite, UA: NEGATIVE
Specific Gravity, UA: 1.028 (ref 1.005–1.030)
Urobilinogen, Ur: 1 mg/dL (ref 0.2–1.0)
pH, UA: 6 (ref 5.0–7.5)

## 2021-07-22 LAB — MICROSCOPIC EXAMINATION
Bacteria, UA: NONE SEEN
Casts: NONE SEEN /lpf
WBC, UA: NONE SEEN /hpf (ref 0–5)

## 2021-07-23 LAB — URINE CULTURE

## 2021-07-24 LAB — CERVICOVAGINAL ANCILLARY ONLY
Bacterial Vaginitis (gardnerella): POSITIVE — AB
Candida Glabrata: NEGATIVE
Candida Vaginitis: NEGATIVE
Chlamydia: NEGATIVE
Comment: NEGATIVE
Comment: NEGATIVE
Comment: NEGATIVE
Comment: NEGATIVE
Comment: NEGATIVE
Comment: NORMAL
Neisseria Gonorrhea: NEGATIVE
Trichomonas: NEGATIVE

## 2021-07-25 ENCOUNTER — Other Ambulatory Visit: Payer: Self-pay | Admitting: Adult Health

## 2021-07-25 MED ORDER — METRONIDAZOLE 500 MG PO TABS: 500.0000 mg | ORAL_TABLET | Freq: Two times a day (BID) | ORAL | 0 refills | Status: DC

## 2021-07-25 NOTE — Progress Notes (Signed)
+  BV on vaginal swab rx flagyl  

## 2021-08-01 ENCOUNTER — Other Ambulatory Visit: Payer: Self-pay | Admitting: Adult Health

## 2021-10-05 ENCOUNTER — Inpatient Hospital Stay (HOSPITAL_COMMUNITY): Payer: 59

## 2021-10-05 ENCOUNTER — Inpatient Hospital Stay (HOSPITAL_COMMUNITY): Payer: 59 | Attending: Hematology | Admitting: Hematology

## 2021-10-05 ENCOUNTER — Encounter (HOSPITAL_COMMUNITY): Payer: Self-pay | Admitting: Hematology

## 2021-10-05 DIAGNOSIS — R718 Other abnormality of red blood cells: Secondary | ICD-10-CM | POA: Insufficient documentation

## 2021-10-05 DIAGNOSIS — Z79899 Other long term (current) drug therapy: Secondary | ICD-10-CM | POA: Diagnosis not present

## 2021-10-05 DIAGNOSIS — F32A Depression, unspecified: Secondary | ICD-10-CM | POA: Insufficient documentation

## 2021-10-05 LAB — CBC WITH DIFFERENTIAL/PLATELET
Abs Immature Granulocytes: 0.01 10*3/uL (ref 0.00–0.07)
Basophils Absolute: 0 10*3/uL (ref 0.0–0.1)
Basophils Relative: 0 %
Eosinophils Absolute: 0.1 10*3/uL (ref 0.0–0.5)
Eosinophils Relative: 1 %
HCT: 40.8 % (ref 36.0–46.0)
Hemoglobin: 12.5 g/dL (ref 12.0–15.0)
Immature Granulocytes: 0 %
Lymphocytes Relative: 33 %
Lymphs Abs: 2.7 10*3/uL (ref 0.7–4.0)
MCH: 23.5 pg — ABNORMAL LOW (ref 26.0–34.0)
MCHC: 30.6 g/dL (ref 30.0–36.0)
MCV: 76.8 fL — ABNORMAL LOW (ref 80.0–100.0)
Monocytes Absolute: 0.3 10*3/uL (ref 0.1–1.0)
Monocytes Relative: 4 %
Neutro Abs: 5 10*3/uL (ref 1.7–7.7)
Neutrophils Relative %: 62 %
Platelets: 287 10*3/uL (ref 150–400)
RBC: 5.31 MIL/uL — ABNORMAL HIGH (ref 3.87–5.11)
RDW: 16 % — ABNORMAL HIGH (ref 11.5–15.5)
WBC: 8.1 10*3/uL (ref 4.0–10.5)
nRBC: 0 % (ref 0.0–0.2)

## 2021-10-05 LAB — IRON AND TIBC
Iron: 74 ug/dL (ref 28–170)
Saturation Ratios: 18 % (ref 10.4–31.8)
TIBC: 416 ug/dL (ref 250–450)
UIBC: 342 ug/dL

## 2021-10-05 LAB — FOLATE: Folate: 4.8 ng/mL — ABNORMAL LOW (ref >5.9)

## 2021-10-05 LAB — RETICULOCYTES
Immature Retic Fract: 8.7 % (ref 2.3–15.9)
RBC.: 5.25 MIL/uL — ABNORMAL HIGH (ref 3.87–5.11)
Retic Count, Absolute: 121.3 10*3/uL (ref 19.0–186.0)
Retic Ct Pct: 2.3 % (ref 0.4–3.1)

## 2021-10-05 LAB — TSH: TSH: 1.402 u[IU]/mL (ref 0.350–4.500)

## 2021-10-05 LAB — FERRITIN: Ferritin: 32 ng/mL (ref 11–307)

## 2021-10-05 LAB — VITAMIN B12: Vitamin B-12: 267 pg/mL (ref 180–914)

## 2021-10-05 LAB — LACTATE DEHYDROGENASE: LDH: 130 U/L (ref 98–192)

## 2021-10-05 MED ORDER — FOLIC ACID 1 MG PO TABS: 1.0000 mg | ORAL_TABLET | Freq: Every day | ORAL | 3 refills | Status: DC

## 2021-10-05 NOTE — Progress Notes (Signed)
[facility] [address], [place] [address]   CLINIC:  Medical Oncology/Hematology  Patient Care Team: Abran Richard, MD as PCP - General (Internal Medicine) Danie Binder, MD (Inactive) as Consulting Physician (Gastroenterology)  CHIEF COMPLAINTS/PURPOSE OF CONSULTATION:  Evaluation for microcytosis, elevated RBC count and reticulocyte count  HISTORY OF PRESENTING ILLNESS:  Robin Delgado 41 y.o. female is here because of evaluation of abnormal CBC, at the request of [facility].  Today she reports feeling well. She reports fatigue which has been occurring over the past 9 years. She is still having menses which occur for 5 days every 28 days with moderate bleeding. She reports she has lost 20 lbs since September; and her appetite is poor; she denies unintentional weight loss. In 2014 she was placed on Remeron to gain weight; at that time she gained about 30 lbs. She has been taking Remeron irregularly since September, and she now takes it 2 times a week as she reports it causes her to oversleep.   She currently lives with her 3 children, and she works as a Quarry manager. She denies smoking history. Her sister, father, and niece have mild thalassemia. Her father passed from bone cancer, and her maternal aunt passed at age 47 from leukemia. She denies family history of sickle cell disease.   MEDICAL HISTORY:  Past Medical History:  Diagnosis Date   Anemia    CIN II (cervical intraepithelial neoplasia II) 2008   Colpo/Leep    Depression    STD (sexually transmitted disease)    Trichomonas,    Vaginal Pap smear, abnormal     SURGICAL HISTORY: Past Surgical History:  Procedure Laterality Date   CHOLECYSTECTOMY N/A 09/25/2013   Procedure: LAPAROSCOPIC CHOLECYSTECTOMY;  Surgeon: Jamesetta So, MD;  Location: [facility];  Service: General;  Laterality: N/A;   DILATION AND CURETTAGE OF UTERUS N/A 07/09/1998   and 02/07/2003   ESOPHAGOGASTRODUODENOSCOPY N/A  09/15/2013   Procedure: ESOPHAGOGASTRODUODENOSCOPY (EGD);  Surgeon: Danie Binder, MD;  Location: [facility];  Service: Endoscopy;  Laterality: N/A;  11:30-moved to [place] notified pt   LAPAROSCOPIC BILATERAL SALPINGECTOMY Bilateral 11/10/2014   Procedure: LAPAROSCOPIC BILATERAL SALPINGECTOMY;  Surgeon: Florian Buff, MD;  Location: [facility];  Service: Gynecology;  Laterality: Bilateral;   TUBAL LIGATION      SOCIAL HISTORY: Social History   Socioeconomic History   Marital status: Single    Spouse name: Not on file   Number of children: Not on file   Years of education: Not on file   Highest education level: Not on file  Occupational History   Occupation: Home Health  Tobacco Use   Smoking status: Never   Smokeless tobacco: Never  Vaping Use   Vaping Use: Never used  Substance and Sexual Activity   Alcohol use: No   Drug use: No   Sexual activity: Yes    Birth control/protection: Patch, Surgical    Comment: tubal  Other Topics Concern   Not on file  Social History Narrative   In school for medical office/nursing.    Live in [place].   Has 3 children, 17, 39, 11 ages.    No tobacco use.    Works 7 days a week.   Social Determinants of Health   Financial Resource Strain: High Risk   Difficulty of Paying Living Expenses: Hard  Food Insecurity: No Food Insecurity   Worried About Running Out of Food in the Last Year: Never  true   Ran Out of Food in the Last Year: Never true  Transportation Needs: No Transportation Needs   Lack of Transportation (Medical): No   Lack of Transportation (Non-Medical): No  Physical Activity: Inactive   Days of Exercise per Week: 0 days   Minutes of Exercise per Session: 0 min  Stress: Stress Concern Present   Feeling of Stress : Rather much  Social Connections: Socially Isolated   Frequency of Communication with Friends and Family: More than three times a week   Frequency of Social Gatherings with Friends and Family: Once a week    Attends Religious Services: Never   Marine scientist or Organizations: No   Attends Music therapist: Never   Marital Status: Never married  Human resources officer Violence: Not At Risk   Fear of Current or Ex-Partner: No   Emotionally Abused: No   Physically Abused: No   Sexually Abused: No    FAMILY HISTORY: Family History  Problem Relation Age of Onset   Hypertension Mother    Alcohol abuse Mother    Depression Mother    Stomach cancer Father        stomach    Hypertension Brother    Alcohol abuse Brother    ADD / ADHD Son    Anxiety disorder Brother    Depression Brother    Bipolar disorder Sister    Diabetes Maternal Grandmother    Drug abuse Neg Hx    Dementia Neg Hx    OCD Neg Hx    Paranoid behavior Neg Hx    Schizophrenia Neg Hx    Seizures Neg Hx    Sexual abuse Neg Hx    Physical abuse Neg Hx    Colon cancer Neg Hx     ALLERGIES:  is allergic to macrobid [nitrofurantoin macrocrystal].  MEDICATIONS:  Current Outpatient Medications  Medication Sig Dispense Refill   busPIRone (BUSPAR) 5 MG tablet Take 1 tablet (5 mg total) by mouth 3 (three) times daily. (Patient not taking: Reported on 07/21/2021) 90 tablet 6   metroNIDAZOLE (FLAGYL) 500 MG tablet Take 1 tablet (500 mg total) by mouth 2 (two) times daily. 14 tablet 0   mirtazapine (REMERON) 15 MG tablet Take 15 mg by mouth at bedtime. (Patient not taking: Reported on 07/21/2021)     ZAFEMY 150-35 MCG/24HR transdermal patch APPLY 1 PATCH TO THE SKIN ONCE WEEKLY. CONTINUOUS USE NO BREAK FROM PATCH. 9 patch 1   No current facility-administered medications for this visit.    REVIEW OF SYSTEMS:   Review of Systems  Constitutional:  Positive for appetite change, fatigue and unexpected weight change (-20 lbs).  Cardiovascular:  Positive for palpitations.  Genitourinary:  Positive for frequency. Negative for menstrual problem.   Psychiatric/Behavioral:  Positive for depression and sleep disturbance.  The patient is nervous/anxious.   All other systems reviewed and are negative.   PHYSICAL EXAMINATION: ECOG PERFORMANCE STATUS: 0 - Asymptomatic  Vitals:   10/05/21 0852  BP: (!) 145/93  Pulse: 85  Resp: 16  Temp: 98.6 F (37 C)  SpO2: 100%   Filed Weights   10/05/21 0852  Weight: 132 lb 7.9 oz (60.1 kg)   Physical Exam Vitals reviewed.  Constitutional:      Appearance: Normal appearance.  Cardiovascular:     Rate and Rhythm: Normal rate and regular rhythm.     Pulses: Normal pulses.     Heart sounds: Normal heart sounds.  Pulmonary:     Effort:  Pulmonary effort is normal.     Breath sounds: Normal breath sounds.  Abdominal:     Palpations: Abdomen is soft. There is no hepatomegaly, splenomegaly or mass.     Tenderness: There is no abdominal tenderness.  Musculoskeletal:     Right lower leg: No edema.     Left lower leg: No edema.  Lymphadenopathy:     Cervical: Cervical adenopathy present.     Right cervical: No superficial cervical adenopathy.    Left cervical: Superficial cervical adenopathy (lower - sub cm) present.     Upper Body:     Right upper body: No supraclavicular adenopathy.     Left upper body: Supraclavicular adenopathy (sub cm) present.     Lower Body: No right inguinal adenopathy. No left inguinal adenopathy.  Neurological:     General: No focal deficit present.     Mental Status: She is alert and oriented to person, place, and time.  Psychiatric:        Mood and Affect: Mood normal.        Behavior: Behavior normal.     LABORATORY DATA:  I have reviewed the data as listed Recent Results (from the past 2160 hour(s))  Cervicovaginal ancillary only     Status: Abnormal   Collection Time: 07/21/21 12:28 PM  Result Value Ref Range   Neisseria Gonorrhea Negative    Chlamydia Negative    Trichomonas Negative    Bacterial Vaginitis (gardnerella) Positive (A)    Candida Vaginitis Negative    Candida Glabrata Negative    Comment      Normal  Reference Range Bacterial Vaginosis - Negative   Comment Normal Reference Range Candida Species - Negative    Comment Normal Reference Range Candida Galbrata - Negative    Comment Normal Reference Range Trichomonas - Negative    Comment Normal Reference Ranger Chlamydia - Negative    Comment      Normal Reference Range Neisseria Gonorrhea - Negative  Urinalysis, Routine w reflex microscopic     Status: Abnormal   Collection Time: 07/21/21  2:00 PM  Result Value Ref Range   Specific Gravity, UA 1.028 1.005 - 1.030   pH, UA 6.0 5.0 - 7.5   Color, UA Yellow Yellow   Appearance Ur Clear Clear   Leukocytes,UA Trace (A) Negative   Protein,UA Trace Negative/Trace   Glucose, UA Negative Negative   Ketones, UA Negative Negative   RBC, UA Trace (A) Negative   Bilirubin, UA Negative Negative   Urobilinogen, Ur 1.0 0.2 - 1.0 mg/dL   Nitrite, UA Negative Negative   Microscopic Examination See below:     Comment: Microscopic was indicated and was performed.  Urine Culture     Status: None   Collection Time: 07/21/21  2:00 PM   Specimen: Urine   UR  Result Value Ref Range   Urine Culture, Routine Final report    Organism ID, Bacteria Comment     Comment: Mixed urogenital flora 10,000-25,000 colony forming units per mL   Microscopic Examination     Status: None   Collection Time: 07/21/21  2:00 PM  Result Value Ref Range   WBC, UA None seen 0 - 5 /hpf   RBC 0-2 0 - 2 /hpf   Epithelial Cells (non renal) 0-10 0 - 10 /hpf   Casts None seen None seen /lpf   Bacteria, UA None seen None seen/Few    RADIOGRAPHIC STUDIES: I have personally reviewed the radiological images as listed and  agreed with the findings in the report. No results found.  ASSESSMENT:  Elevated RBC count, microcytosis and high reticulocyte count: - CBC on 08/24/2021: RBC count 5.38 million, MCV 74, hemoglobin 12.3, low MCH and MCHC, normal platelet count.  Reticulocyte percent was 2.4.  Absolute reticulocyte count 129120  (20000-80000) - She reports tiredness for the last 9 years, gradually worsening.  Never had blood transfusion. - She has menstrual bleeding 5/28, moderate. - She was on mirtazapine 15 mg half tablet daily since 2014, gained about 32 pounds.  Since September, she has not been taking mirtazapine on a regular basis and lost about 18 pounds.  She is taking half tablet about twice weekly due to sedation it causes in the mornings.   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  She has 3 older children.  She is a non-smoker. - Sister and her niece have thalassemia trait.  Father died of bone cancer but could have thalassemia trait.  Maternal aunt died of leukemia.   PLAN:  Elevated RBC count/microcytosis/elevated reticulocyte count: - Recommend repeat CBC today. - Findings are highly suggestive of thalassemia trait. - Recommend hemoglobin electrophoresis. - If thalassemia trait confirmed, no further testing needed.  RTC 2 weeks for follow-up to discuss results.  2.  Tiredness: - We will check TSH, 123456, folic acid, MMA, ferritin and iron panel.   All questions were answered. The patient knows to call the clinic with any problems, questions or concerns.  Derek Jack, MD 10/05/21 8:56 AM  [facility] [phone]   I, Thana Ates, am acting as a scribe for Dr. Derek Jack.  I, Derek Jack MD, have reviewed the above documentation for accuracy and completeness, and I agree with the above.

## 2021-10-05 NOTE — Patient Instructions (Addendum)
[facility] ?Discharge Instructions ? ? ?You were seen and examined today by Dr. Delton Coombes. He is a blood specialist who is see you today at request of your primary care physician for an abnormal CBC and fatigue. ? ?We will obtain some blood work on you today to investigate this further.  ? ?We will see you back in a couple weeks to review the results.  ? ? ?Thank you for choosing [facility] to provide your oncology and hematology care.  To afford each patient quality time with our provider, please arrive at least 15 minutes before your scheduled appointment time.  ? ?If you have a lab appointment with the [facility] please come in thru the Main Entrance and check in at the main information desk. ? ?You need to re-schedule your appointment should you arrive 10 or more minutes late.  We strive to give you quality time with our providers, and arriving late affects you and other patients whose appointments are after yours.  Also, if you no show three or more times for appointments you may be dismissed from the clinic at the providers discretion.     ?Again, thank you for choosing [facility].  Our hope is that these requests will decrease the amount of time that you wait before being seen by our physicians.       ?_____________________________________________________________ ? ?Should you have questions after your visit to [facility], please contact our office at [phone] and follow the prompts.  Our office hours are 8:00 a.m. and 4:30 p.m. Monday - Friday.  Please note that voicemails left after 4:00 p.m. may not be returned until the following business day.  We are closed weekends and major holidays.  You do have access to a nurse 24-7, just call the main number to the clinic [phone] and do not press any options, hold on the line and a nurse will answer the phone.   ? ?For prescription refill requests,  have your pharmacy contact our office and allow 72 hours.   ? ?Due to Covid, you will need to wear a mask upon entering the hospital. If you do not have a mask, a mask will be given to you at the Main Entrance upon arrival. For doctor visits, patients may have 1 support person age 37 or older with them. For treatment visits, patients can not have anyone with them due to social distancing guidelines and our immunocompromised population.  ? ?   ?

## 2021-10-09 LAB — HGB FRACTIONATION CASCADE
Hgb A2: 2.4 % (ref 1.8–3.2)
Hgb A: 97.6 % (ref 96.4–98.8)
Hgb F: 0 % (ref 0.0–2.0)
Hgb S: 0 %

## 2021-10-09 LAB — METHYLMALONIC ACID, SERUM: Methylmalonic Acid, Quantitative: 175 nmol/L (ref 0–378)

## 2021-11-22 NOTE — Progress Notes (Signed)
[facility] [address] [place], [place] [address]   CLINIC:  Medical Oncology/Hematology  PCP:  Alvina FilbertHunter, Denise, MD [address] [place] [address] [phone]   REASON FOR VISIT:  Follow-up for microcytosis  PRIOR THERAPY: None  CURRENT THERAPY: Under work-up  INTERVAL HISTORY:  Robin Delgado 41 y.o. female returns for routine follow-up of her microcytosis.  She was seen for initial visit by Dr. Ellin SabaKatragadda on 10/05/2021.  At today's visit, she reports feeling fair apart from ongoing fatigue.  No recent hospitalizations, surgeries, or changes in baseline health status.  She is not currently on any iron supplementation, but she previously took iron pill in the past.  She denies any bright red blood per rectum or melena, but she does have moderate bleeding with menses 5 days out of every 28 days.  She has chronic fatigue with energy at about 40%.  She denies any pica, restless legs, headaches, chest pain, dyspnea on exertion, lightheadedness, or syncope.  She has 40% energy and 50% appetite. She endorses that she is maintaining a stable weight.   REVIEW OF SYSTEMS:  Review of Systems  Constitutional:  Positive for fatigue. Negative for appetite change, chills, diaphoresis, fever and unexpected weight change.  HENT:   Negative for lump/mass and nosebleeds.   Eyes:  Negative for eye problems.  Respiratory:  Negative for cough, hemoptysis and shortness of breath.   Cardiovascular:  Negative for chest pain, leg swelling and palpitations.  Gastrointestinal:  Negative for abdominal pain, blood in stool, constipation, diarrhea, nausea and vomiting.  Genitourinary:  Negative for hematuria.   Skin: Negative.   Neurological:  Negative for dizziness, headaches and light-headedness.  Hematological:  Does not bruise/bleed easily.  Psychiatric/Behavioral:  Positive for sleep disturbance. The patient is nervous/anxious.      PAST MEDICAL/SURGICAL HISTORY:   Past Medical History:  Diagnosis Date   Anemia    CIN II (cervical intraepithelial neoplasia II) 2008   Colpo/Leep    Depression    STD (sexually transmitted disease)    Trichomonas,    Vaginal Pap smear, abnormal    Past Surgical History:  Procedure Laterality Date   CHOLECYSTECTOMY N/A 09/25/2013   Procedure: LAPAROSCOPIC CHOLECYSTECTOMY;  Surgeon: Dalia HeadingMark A Jenkins, MD;  Location: [facility] ORS;  Service: General;  Laterality: N/A;   DILATION AND CURETTAGE OF UTERUS N/A 07/09/1998   and 02/07/2003   ESOPHAGOGASTRODUODENOSCOPY N/A 09/15/2013   Procedure: ESOPHAGOGASTRODUODENOSCOPY (EGD);  Surgeon: West BaliSandi L Fields, MD;  Location: [facility] END[facility] SUITE;  Service: Endoscopy;  Laterality: N/A;  11:30-moved to 1030 Leigh Ann notified pt   LAPAROSCOPIC BILATERAL SALPINGECTOMY Bilateral 11/10/2014   Procedure: LAPAROSCOPIC BILATERAL SALPINGECTOMY;  Surgeon: Lazaro ArmsLuther H Eure, MD;  Location: [facility] ORS;  Service: Gynecology;  Laterality: Bilateral;   TUBAL LIGATION       SOCIAL HISTORY:  Social History   Socioeconomic History   Marital status: Single    Spouse name: Not on file   Number of children: Not on file   Years of education: Not on file   Highest education level: Not on file  Occupational History   Occupation: Home Health  Tobacco Use   Smoking status: Never   Smokeless tobacco: Never  Vaping Use   Vaping Use: Never used  Substance and Sexual Activity   Alcohol use: No   Drug use: No   Sexual activity: Yes    Birth control/protection: Patch, Surgical    Comment: tubal  Other Topics Concern   Not  on file  Social History Narrative   In school for medical office/nursing.    Live in [place].   Has 3 children, 17, 13, 11 ages.    No tobacco use.    Works 7 days a week.   Social Determinants of Health   Financial Resource Strain: High Risk   Difficulty of Paying Living Expenses: Hard  Food Insecurity: No Food Insecurity   Worried About Programme researcher, broadcasting/film/video in the Last Year: Never true   Ran Out  of Food in the Last Year: Never true  Transportation Needs: No Transportation Needs   Lack of Transportation (Medical): No   Lack of Transportation (Non-Medical): No  Physical Activity: Inactive   Days of Exercise per Week: 0 days   Minutes of Exercise per Session: 0 min  Stress: Stress Concern Present   Feeling of Stress : Rather much  Social Connections: Socially Isolated   Frequency of Communication with Friends and Family: More than three times a week   Frequency of Social Gatherings with Friends and Family: Once a week   Attends Religious Services: Never   Database administrator or Organizations: No   Attends Engineer, structural: Never   Marital Status: Never married  Catering manager Violence: Not At Risk   Fear of Current or Ex-Partner: No   Emotionally Abused: No   Physically Abused: No   Sexually Abused: No    FAMILY HISTORY:  Family History  Problem Relation Age of Onset   Hypertension Mother    Alcohol abuse Mother    Depression Mother    Stomach cancer Father        stomach    Hypertension Brother    Alcohol abuse Brother    ADD / ADHD Son    Anxiety disorder Brother    Depression Brother    Bipolar disorder Sister    Diabetes Maternal Grandmother    Drug abuse Neg Hx    Dementia Neg Hx    OCD Neg Hx    Paranoid behavior Neg Hx    Schizophrenia Neg Hx    Seizures Neg Hx    Sexual abuse Neg Hx    Physical abuse Neg Hx    Colon cancer Neg Hx     CURRENT MEDICATIONS:  Outpatient Encounter Medications as of 11/23/2021  Medication Sig   busPIRone (BUSPAR) 5 MG tablet Take 1 tablet (5 mg total) by mouth 3 (three) times daily.   folic acid (FOLVITE) 1 MG tablet Take 1 tablet (1 mg total) by mouth daily.   metroNIDAZOLE (FLAGYL) 500 MG tablet Take 1 tablet (500 mg total) by mouth 2 (two) times daily.   mirtazapine (REMERON) 15 MG tablet Take 15 mg by mouth at bedtime.   ZAFEMY 150-35 MCG/24HR transdermal patch APPLY 1 PATCH TO THE SKIN ONCE WEEKLY.  CONTINUOUS USE NO BREAK FROM PATCH.   No facility-administered encounter medications on file as of 11/23/2021.    ALLERGIES:  Allergies  Allergen Reactions   Macrobid [Nitrofurantoin Macrocrystal] Itching     PHYSICAL EXAM:  ECOG PERFORMANCE STATUS: 1 - Symptomatic but completely ambulatory  There were no vitals filed for this visit. There were no vitals filed for this visit. Physical Exam Constitutional:      Appearance: Normal appearance.  HENT:     Head: Normocephalic and atraumatic.     Mouth/Throat:     Mouth: Mucous membranes are moist.  Eyes:     Extraocular Movements: Extraocular movements intact.  Pupils: Pupils are equal, round, and reactive to light.  Cardiovascular:     Rate and Rhythm: Normal rate and regular rhythm.     Pulses: Normal pulses.     Heart sounds: Normal heart sounds.  Pulmonary:     Effort: Pulmonary effort is normal.     Breath sounds: Normal breath sounds.  Abdominal:     General: Bowel sounds are normal.     Palpations: Abdomen is soft.     Tenderness: There is no abdominal tenderness.  Musculoskeletal:        General: No swelling.     Right lower leg: No edema.     Left lower leg: No edema.  Lymphadenopathy:     Cervical: No cervical adenopathy.  Skin:    General: Skin is warm and dry.  Neurological:     General: No focal deficit present.     Mental Status: She is alert and oriented to person, place, and time.  Psychiatric:        Mood and Affect: Mood normal.        Behavior: Behavior normal.     LABORATORY DATA:  I have reviewed the labs as listed.  CBC    Component Value Date/Time   WBC 8.1 10/05/2021 1033   RBC 5.25 (H) 10/05/2021 1033   RBC 5.31 (H) 10/05/2021 1033   HGB 12.5 10/05/2021 1033   HCT 40.8 10/05/2021 1033   PLT 287 10/05/2021 1033   MCV 76.8 (L) 10/05/2021 1033   MCH 23.5 (L) 10/05/2021 1033   MCHC 30.6 10/05/2021 1033   RDW 16.0 (H) 10/05/2021 1033   LYMPHSABS 2.7 10/05/2021 1033   MONOABS 0.3  10/05/2021 1033   EOSABS 0.1 10/05/2021 1033   BASOSABS 0.0 10/05/2021 1033      Latest Ref Rng & Units 11/25/2017    3:54 PM 09/21/2016   12:25 PM 03/23/2016    3:50 PM  CMP  Glucose 65 - 139 mg/dL 027   253   76    BUN 7 - 25 mg/dL 13   11   11     Creatinine 0.50 - 1.10 mg/dL   6.64   4.03    Sodium 135 - 146 mmol/L 140   139   139    Potassium 3.5 - 5.3 mmol/L 3.7   3.9   3.9    Chloride 98 - 110 mmol/L 107   106   105    CO2 20 - 32 mmol/L 27   23   27     Calcium 8.6 - 10.2 mg/dL 9.0   8.6   8.7    Total Protein 6.1 - 8.1 g/dL 7.4   7.1     Total Bilirubin 0.2 - 1.2 mg/dL 0.3   0.4     Alkaline Phos 33 - 115 U/L  84     AST 10 - 30 U/L 13   14     ALT 6 - 29 U/L 10   11       DIAGNOSTIC IMAGING:  I have independently reviewed the relevant imaging and discussed with the patient.  ASSESSMENT & PLAN: 1.  Elevated RBC count, microcytosis and high reticulocyte count: - CBC on 08/24/2021: RBC count 5.38 million, MCV 74, hemoglobin 12.3, low MCH and MCHC, normal platelet count.  Reticulocyte percent was 2.4.  Absolute reticulocyte count 129120 (20000-80000) - She has menstrual bleeding 5 of every 28 days, moderate. - No bright red blood per rectum  - Hemoglobin  fractionation cascade was within normal limits, but this does not exclude the possibility of alpha thalassemia trait - Additional labs (10/05/2021) show normal reticulocytes. - Moderate iron deficiency (10/05/2021) with ferritin 32, iron saturation 18%, and TIBC 416 - Differential diagnosis favors combination of suspected alpha thalassemia trait as well as moderate iron deficiency - PLAN: Recommend iron repletion with ferrous sulfate 325 mg daily.   - We will repeat CBC and iron panel and copper with RTC in 6 months  2.  Fatigue - She reports tiredness for the last 9 years, gradually worsening.  Never had blood transfusion. - She was on mirtazapine 15 mg half tablet daily since 2014, gained about 32 pounds.  Since  September, she has not been taking mirtazapine on a regular basis and lost about 18 pounds.  She is taking half tablet about twice weekly due to sedation it causes in the mornings. - Work-up revealed moderate iron deficiency (ferritin 30, iron saturation 18%) and folate deficiency (folate 4.8).  B12/methylmalonic acid normal.  Normal TSH. - PLAN: Iron repletion with ferrous sulfate 325 mg daily.  Folic acid 400 mcg daily.  3.  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  She has 3 older children.  She is a non-smoker. - Sister and her niece have thalassemia trait.  Father died of bone cancer but could have thalassemia trait.  Maternal aunt died of leukemia.    All questions were answered. The patient knows to call the clinic with any problems, questions or concerns.  Medical decision making: Moderate  Time spent on visit: I spent 20 minutes counseling the patient face to face. The total time spent in the appointment was 30 minutes and more than 50% was on counseling.   Carnella Guadalajara, PA-C  11/23/21 10:41 AM

## 2021-11-23 ENCOUNTER — Inpatient Hospital Stay (HOSPITAL_COMMUNITY): Payer: 59 | Admitting: Dietician

## 2021-11-23 ENCOUNTER — Inpatient Hospital Stay (HOSPITAL_COMMUNITY): Payer: 59 | Attending: Hematology | Admitting: Physician Assistant

## 2021-11-23 VITALS — BP 169/97 | HR 80 | Temp 97.5°F | Resp 16 | Ht 63.78 in | Wt 131.0 lb

## 2021-11-23 DIAGNOSIS — E611 Iron deficiency: Secondary | ICD-10-CM | POA: Diagnosis not present

## 2021-11-23 DIAGNOSIS — Z79899 Other long term (current) drug therapy: Secondary | ICD-10-CM | POA: Diagnosis not present

## 2021-11-23 DIAGNOSIS — E538 Deficiency of other specified B group vitamins: Secondary | ICD-10-CM

## 2021-11-23 DIAGNOSIS — R718 Other abnormality of red blood cells: Secondary | ICD-10-CM

## 2021-11-23 NOTE — Progress Notes (Signed)
Nutrition Assessment   Reason for Assessment: MST (poor appetite, wt loss)   ASSESSMENT: 41 year old female with RBC microcytosis.   Past medical history includes anemia, CIN II, depression, trichomonas.   Met with patient in clinic. She reports fatigue and poor appetite persists. Patient states she does not get hungry. She reports unable to eat when worried and has a lot of stressors. Yesterday she had one hot dog, cup of ice cream, and half bottle of water. Eating once daily is normal for her. Patient reports she and milk do not get along and will not drink nutrition supplements. Patient declined samples of Ensure Clear (juice type) offered today. She is not open to any nutrition interventions at this time. Patient is tired and ready to go.    Nutrition Focused Physical Exam: deferred    Medications: folvite, flagyl, remeron, buspar   Labs: labs pending at this time   Anthropometrics: Weights have decreased 13% (~20 lbs) in the last 12 months. This is insignificant for time frame, however concerning given ongoing poor appetite and dietary recall indicating poor intake of nutritious foods  Height: 5' 3.78" Weight: 130 lb 15.3 oz  UBW: 149 lb 8 oz (11/29/20) BMI: 22.63   NUTRITION DIAGNOSIS: Inadequate oral intake related to poor appetite as evidenced by reported social/environmental factors reducing patient desire to eat, dietary recall, 13% weight loss in the last 12 months; insignificant for time frame   INTERVENTION:  Encouraged small frequent meals/snacks - handout with ideas provided Suggested setting "snack time alarms" on cell phone q2h  Recommend daily MVI Educated on support services available at [facility] and encouraged to contact with nutrition questions/concerns Contact information provided    MONITORING, EVALUATION, GOAL: Patient will tolerate increased calories and protein to promote weight stability    Next Visit: No follow-up scheduled as pt is not interested  in nutrition services at this time

## 2021-11-23 NOTE — Patient Instructions (Signed)
[facility] at [facility] Discharge Instructions  You were seen today by Rojelio Brenner PA-C for your anemia and abnormal blood cells.  ABNORMAL BLOOD CELLS: Your red blood cells tend to be small.  Therefore, your body compensates by making an increased number of red blood cells. This is caused by a condition called "alpha thalassemia minor."  This means that you most likely have a single genetic mutation that causes your body to make abnormal hemoglobin (the main protein in your blood).  However, your body has another copy of the gene that makes normal hemoglobin, so that is able to compensate for the abnormality. This condition is not dangerous to you, and does not need to be treated.   This trait can be passed on to your children and grandchildren.  They should discuss any blood abnormalities with their own doctors if needed.    Your iron levels are slightly low.  I recommend that you start taking over-the-counter iron supplementation (FERROUS SULFATE 325 mg) once daily.  Take this with a glass of orange juice to help your body absorb it better.  If it causes any constipation, you can take an over-the-counter stool softener to keep your bowel movements regular.  Your folic acid levels are also low.  Recommend that you start taking over-the-counter folic acid supplement 400 mcg daily.   LABS: Return in 6 months for repeat labs  FOLLOW-UP APPOINTMENT: Office visit in 6 months, after labs   Thank you for choosing [facility] at [facility] to provide your oncology and hematology care.  To afford each patient quality time with our provider, please arrive at least 15 minutes before your scheduled appointment time.   If you have a lab appointment with the Cancer Center please come in thru the Main Entrance and check in at the main information desk.  You need to re-schedule your appointment should you arrive 10 or more minutes late.  We strive to  give you quality time with our providers, and arriving late affects you and other patients whose appointments are after yours.  Also, if you no show three or more times for appointments you may be dismissed from the clinic at the providers discretion.     Again, thank you for choosing [facility].  Our hope is that these requests will decrease the amount of time that you wait before being seen by our physicians.       _____________________________________________________________  Should you have questions after your visit to [facility], please contact our office at [phone] and follow the prompts.  Our office hours are 8:00 a.m. and 4:30 p.m. Monday - Friday.  Please note that voicemails left after 4:00 p.m. may not be returned until the following business day.  We are closed weekends and major holidays.  You do have access to a nurse 24-7, just call the main number to the clinic [phone] and do not press any options, hold on the line and a nurse will answer the phone.    For prescription refill requests, have your pharmacy contact our office and allow 72 hours.    Due to Covid, you will need to wear a mask upon entering the hospital. If you do not have a mask, a mask will be given to you at the Main Entrance upon arrival. For doctor visits, patients may have 1 support person age 21 or older with them. For treatment visits, patients can not have  anyone with them due to social distancing guidelines and our immunocompromised population.     

## 2021-11-30 ENCOUNTER — Encounter: Payer: Self-pay | Admitting: Obstetrics & Gynecology

## 2021-11-30 ENCOUNTER — Other Ambulatory Visit (HOSPITAL_COMMUNITY)
Admission: RE | Admit: 2021-11-30 | Discharge: 2021-11-30 | Disposition: A | Discharge status: Home / Self Care | Payer: 59 | Source: Ambulatory Visit | Attending: Obstetrics & Gynecology | Admitting: Obstetrics & Gynecology

## 2021-11-30 ENCOUNTER — Ambulatory Visit (INDEPENDENT_AMBULATORY_CARE_PROVIDER_SITE_OTHER): Payer: 59 | Admitting: Obstetrics & Gynecology

## 2021-11-30 VITALS — BP 135/82 | HR 69 | Ht 64.0 in | Wt 131.8 lb

## 2021-11-30 DIAGNOSIS — N760 Acute vaginitis: Secondary | ICD-10-CM | POA: Insufficient documentation

## 2021-11-30 DIAGNOSIS — N939 Abnormal uterine and vaginal bleeding, unspecified: Secondary | ICD-10-CM

## 2021-11-30 DIAGNOSIS — Z01411 Encounter for gynecological examination (general) (routine) with abnormal findings: Secondary | ICD-10-CM | POA: Diagnosis not present

## 2021-11-30 MED ORDER — ZAFEMY 150-35 MCG/24HR TD PTWK: MEDICATED_PATCH | TRANSDERMAL | 4 refills | Status: DC

## 2021-11-30 NOTE — Progress Notes (Signed)
WELL-WOMAN EXAMINATION Patient name: Robin Delgado MRN [id number]  Date of birth: [birth year] Chief Complaint:   Gynecologic Exam and Annual Exam  History of Present Illness:   Robin Delgado is a 41 y.o. G25P3003 female being seen today for a routine well-woman exam and follow up regarding:  -AUB:  Pt has been on the patch for 6-42yrs- menses regular each month last for 5 days.  3 pads per day Denies HMB or dysmenorrhea. Denies intermenstrual bleeding.  Doing well with the patch and wishes to continue  Recurrent BV: Typically notes vaginal odor and slight clear to watery discharge.  Sexually active with same partner.  If uses condoms, typically does not have symptoms. She notes she often doesn't come in when she feels like she has an infection.  Of note, she does feel symptomatic currently. Records reviewed- BV noted both in January 2023, April and May 2022  Patient's last menstrual period was 11/12/2021 (approximate). Denies issues with her menses The current method of family planning is tubal ligation.    Last pap 11/2020 negative.  Last mammogram: 06/2021- Cat. 3- benign. Last colonoscopy: n/a     11/30/2021    9:00 AM 11/29/2020   10:39 AM 12/10/2017   10:07 AM 11/14/2017    1:36 PM 10/23/2017    2:45 PM  Depression screen PHQ 2/9  Decreased Interest 2 1 3 1  0  Down, Depressed, Hopeless 0 2 3 1 2   PHQ - 2 Score 2 3 6 2 2   Altered sleeping 2 1 2 2  0  Tired, decreased energy 3 3 3 3 1   Change in appetite 2 1 2 2  0  Feeling bad or failure about yourself  0 0 1 0 0  Trouble concentrating 0 1 2 0 0  Moving slowly or fidgety/restless 0 1 0 0 0  Suicidal thoughts 0 0 0 0 0  PHQ-9 Score 9 10 16 9 3   Difficult doing work/chores   Somewhat difficult Somewhat difficult       Review of Systems:   Pertinent items are noted in HPI Denies any headaches, blurred vision, fatigue, shortness of breath, chest pain, abdominal pain, bowel movements, urination, or intercourse  unless otherwise stated above.  Pertinent History Reviewed:  Reviewed past medical,surgical, social and family history.  Reviewed problem list, medications and allergies. Physical Assessment:   Vitals:   11/30/21 0900  BP: 135/82  Pulse: 69  Weight: 131 lb 12.8 oz (59.8 kg)  Height: 5\' 4"  (1.626 m)  Body mass index is 22.62 kg/m.        Physical Examination:   General appearance - well appearing, and in no distress  Mental status - alert, oriented to person, place, and time  Psych:  She has a normal mood and affect  Skin - warm and dry, normal color, no suspicious lesions noted  Chest - effort normal, all lung fields clear to auscultation bilaterally  Heart - normal rate and regular rhythm  Neck:  midline trachea, no thyromegaly or nodules  Breasts - breasts appear normal, no suspicious masses, no skin or nipple changes or  axillary nodes  Abdomen - soft, nontender, nondistended, no masses or organomegaly  Pelvic - VULVA: normal appearing vulva with no masses, tenderness or lesions  VAGINA: normal appearing vagina with normal color and discharge- minimal white discharge noted, no lesions  CERVIX: normal appearing cervix without discharge or lesions, no CMT   UTERUS: uterus is felt to be normal size, shape, consistency and  nontender   ADNEXA: No adnexal masses or tenderness noted.  Extremities:  No swelling, no calf tenderness bilaterally  Chaperone: Airline pilot & Plan:  1) Well-Woman Exam -pap up to date -pt to call [facility] to schedule mammogram  2) AUB -doing well with patch and plan to continue  3) Recurrent BV -vaginitis panel collected today -if positive plan to treat with oral x 7 days then metrogel weekly and/or prn after intercourse -also encouraged regular condom use -start OTC probiotic -pending how treatment is going advised f/u in 19mos if needed   Meds:  Meds ordered this encounter  Medications   norelgestromin-ethinyl estradiol (ZAFEMY) 150-35  MCG/24HR transdermal patch    Sig: APPLY 1 PATCH TO THE SKIN ONCE WEEKLY. CONTINUOUS USE NO BREAK FROM PATCH.    Dispense:  9 patch    Refill:  4    Follow-up: Return in about 1 year (around 12/01/2022) for Annual.   Myna Hidalgo, DO Attending Obstetrician & Gynecologist, [facility] for [facility], [facility]

## 2021-12-05 LAB — CERVICOVAGINAL ANCILLARY ONLY
Bacterial Vaginitis (gardnerella): POSITIVE — AB
Candida Glabrata: NEGATIVE
Candida Vaginitis: NEGATIVE
Comment: NEGATIVE
Comment: NEGATIVE
Comment: NEGATIVE

## 2021-12-07 ENCOUNTER — Other Ambulatory Visit: Payer: Self-pay | Admitting: Obstetrics & Gynecology

## 2021-12-07 DIAGNOSIS — N760 Acute vaginitis: Secondary | ICD-10-CM

## 2021-12-07 MED ORDER — METRONIDAZOLE 0.75 % VA GEL: 1.0000 | VAGINAL | 2 refills | Status: AC

## 2021-12-07 MED ORDER — METRONIDAZOLE 500 MG PO TABS: 500.0000 mg | ORAL_TABLET | Freq: Two times a day (BID) | ORAL | 2 refills | Status: AC

## 2021-12-21 ENCOUNTER — Other Ambulatory Visit (HOSPITAL_COMMUNITY): Payer: Self-pay | Admitting: Adult Health

## 2021-12-21 ENCOUNTER — Other Ambulatory Visit (HOSPITAL_COMMUNITY): Payer: Self-pay | Admitting: Adult Health Nurse Practitioner

## 2021-12-21 DIAGNOSIS — R921 Mammographic calcification found on diagnostic imaging of breast: Secondary | ICD-10-CM

## 2022-01-11 ENCOUNTER — Ambulatory Visit (HOSPITAL_COMMUNITY)
Admission: RE | Admit: 2022-01-11 | Discharge: 2022-01-11 | Disposition: A | Discharge status: Home / Self Care | Payer: 59 | Source: Ambulatory Visit | Attending: Adult Health | Admitting: Adult Health

## 2022-01-11 DIAGNOSIS — R921 Mammographic calcification found on diagnostic imaging of breast: Secondary | ICD-10-CM | POA: Diagnosis not present

## 2022-06-05 ENCOUNTER — Encounter: Payer: Self-pay | Admitting: Obstetrics & Gynecology

## 2022-06-07 ENCOUNTER — Inpatient Hospital Stay: Payer: 59

## 2022-06-14 ENCOUNTER — Ambulatory Visit: Payer: 59 | Admitting: Physician Assistant

## 2022-07-04 DIAGNOSIS — R519 Headache, unspecified: Secondary | ICD-10-CM | POA: Diagnosis not present

## 2022-07-04 DIAGNOSIS — Z79899 Other long term (current) drug therapy: Secondary | ICD-10-CM | POA: Diagnosis not present

## 2022-07-04 DIAGNOSIS — R5383 Other fatigue: Secondary | ICD-10-CM | POA: Diagnosis not present

## 2022-07-04 DIAGNOSIS — G43009 Migraine without aura, not intractable, without status migrainosus: Secondary | ICD-10-CM | POA: Diagnosis not present

## 2022-07-04 DIAGNOSIS — D649 Anemia, unspecified: Secondary | ICD-10-CM | POA: Diagnosis not present

## 2022-07-04 DIAGNOSIS — R69 Illness, unspecified: Secondary | ICD-10-CM | POA: Diagnosis not present

## 2022-07-12 ENCOUNTER — Other Ambulatory Visit: Payer: 59

## 2022-07-12 ENCOUNTER — Other Ambulatory Visit (HOSPITAL_COMMUNITY)
Admission: RE | Admit: 2022-07-12 | Discharge: 2022-07-12 | Disposition: A | Discharge status: Home / Self Care | Payer: 59 | Source: Ambulatory Visit | Attending: Physician Assistant | Admitting: Physician Assistant

## 2022-07-12 DIAGNOSIS — R718 Other abnormality of red blood cells: Secondary | ICD-10-CM | POA: Diagnosis present

## 2022-07-12 DIAGNOSIS — R701 Abnormal plasma viscosity: Secondary | ICD-10-CM | POA: Diagnosis not present

## 2022-07-12 LAB — IRON AND TIBC
Iron: 76 ug/dL (ref 28–170)
Saturation Ratios: 20 % (ref 10.4–31.8)
TIBC: 375 ug/dL (ref 250–450)
UIBC: 299 ug/dL

## 2022-07-12 LAB — CBC WITH DIFFERENTIAL/PLATELET
Abs Immature Granulocytes: 0.02 10*3/uL (ref 0.00–0.07)
Basophils Absolute: 0 10*3/uL (ref 0.0–0.1)
Basophils Relative: 0 %
Eosinophils Absolute: 0.1 10*3/uL (ref 0.0–0.5)
Eosinophils Relative: 1 %
HCT: 37.2 % (ref 36.0–46.0)
Hemoglobin: 11.3 g/dL — ABNORMAL LOW (ref 12.0–15.0)
Immature Granulocytes: 0 %
Lymphocytes Relative: 34 %
Lymphs Abs: 3.1 10*3/uL (ref 0.7–4.0)
MCH: 23.1 pg — ABNORMAL LOW (ref 26.0–34.0)
MCHC: 30.4 g/dL (ref 30.0–36.0)
MCV: 75.9 fL — ABNORMAL LOW (ref 80.0–100.0)
Monocytes Absolute: 0.5 10*3/uL (ref 0.1–1.0)
Monocytes Relative: 5 %
Neutro Abs: 5.3 10*3/uL (ref 1.7–7.7)
Neutrophils Relative %: 60 %
Platelets: 292 10*3/uL (ref 150–400)
RBC: 4.9 MIL/uL (ref 3.87–5.11)
RDW: 15.6 % — ABNORMAL HIGH (ref 11.5–15.5)
WBC: 9 10*3/uL (ref 4.0–10.5)
nRBC: 0 % (ref 0.0–0.2)

## 2022-07-12 LAB — FOLATE: Folate: 6.2 ng/mL (ref >5.9)

## 2022-07-12 LAB — FERRITIN: Ferritin: 39 ng/mL (ref 11–307)

## 2022-07-16 LAB — COPPER, SERUM: Copper: 252 ug/dL — ABNORMAL HIGH (ref 80–158)

## 2022-07-19 ENCOUNTER — Other Ambulatory Visit: Payer: 59

## 2022-07-25 NOTE — Progress Notes (Signed)
[facility] [address], [place] [address]   CLINIC:  Medical Oncology/Hematology  PCP:  Alvina Filbert, MD [address] [place] [address] [phone]   REASON FOR VISIT:  Follow-up for microcytosis, iron deficiency, folic acid deficiency   PRIOR THERAPY: None   CURRENT THERAPY: Daily iron tablet + folic acid supplement  INTERVAL HISTORY:   Robin Delgado 42 y.o. female returns for routine follow-up of microcytosis, iron deficiency, and folic acid deficiency.  She was last seen by Rojelio Brenner PA-C on 11/23/2021.  At today's visit, she reports feeling fair apart from ongoing fatigue.  No recent hospitalizations, surgeries, or changes in baseline health status.  Patient did not remember to start taking her iron tablet or folic acid supplement.  Her menstrual cycles have been lighter for the past few months, only lasting 3 days as opposed to her usual 5 days of moderate bleeding.  She has worsening fatigue with energy at about 25%.  She denies any pica, restless legs, headaches, chest pain, dyspnea on exertion, lightheadedness, or syncope.  She is not sleeping well at home.  She has 25% energy and 75% appetite. She endorses that she is maintaining a stable weight.   ASSESSMENT & PLAN:  1.  Chronic microcytosis: - Microcytosis and elevated RBC since at least 2014, with MCV ranging from 69.7 to 76.8, iron proportion to hemoglobin - She reports that her sister and father have some sort of thalassemia - Hemoglobin fractionation cascade was within normal limits, but this does not exclude the possibility of alpha thalassemia trait - Most recent labs (07/12/2022): Hgb 11.3/RBC 4.90/MCV 75.9.  Ferritin 39, iron saturation 20%.  No evidence of copper deficiency. - Differential diagnosis favors combination of suspected alpha thalassemia trait as well as moderate iron deficiency (addressed below)   - PLAN: Check alpha thalassemia genotype.     2.   Iron deficiency and folate deficiency - Work-up of fatigue and microcytosis revealed moderate iron deficiency (ferritin 30, iron saturation 18%) and folate deficiency (folate 4.8).  B12/methylmalonic acid normal.  Normal TSH. - She has menstrual bleeding 5 of every 28 days, moderate. - No bright red blood per rectum - She forgot to start taking iron and folate supplements after visit in May 2023 - Most recent labs (07/12/2022): Hgb 11.3/RBC 4.90/MCV 75.9.  Ferritin 39, iron saturation 20%.  No evidence of copper deficiency.  Folate improved 6.2. - PLAN: Patient reminded to START taking her daily iron and folic acid supplements   - Repeat CBC/D, ferritin, iron/TIBC, folate in 6 months with PHONE visit after   3.  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  She has 3 older children.  She is a non-smoker. - Sister and her niece have thalassemia trait.  Father died of bone cancer but could have thalassemia trait.  Maternal aunt died of leukemia.  PLAN SUMMARY: >> Labs in 5 months (CBC/D, ferritin, iron/TIBC, folate, Alpha Thalassemia Genotype) >> PHONE visit in 6 months - THREE weeks after labs    REVIEW OF SYSTEMS:   Review of Systems  Constitutional:  Positive for fatigue. Negative for appetite change, chills, diaphoresis, fever and unexpected weight change.  HENT:   Negative for lump/mass and nosebleeds.   Eyes:  Negative for eye problems.  Respiratory:  Negative for cough, hemoptysis and shortness of breath.   Cardiovascular:  Negative for chest pain, leg swelling and palpitations.  Gastrointestinal:  Negative for abdominal pain, blood  in stool, constipation, diarrhea, nausea and vomiting.  Genitourinary:  Negative for hematuria.   Skin: Negative.   Neurological:  Negative for dizziness, headaches and light-headedness.  Hematological:  Does not bruise/bleed easily.  Psychiatric/Behavioral:  Positive for sleep disturbance. The patient is nervous/anxious.      PHYSICAL  EXAM:  ECOG PERFORMANCE STATUS: 0 - Asymptomatic  There were no vitals filed for this visit. There were no vitals filed for this visit. Physical Exam Constitutional:      Appearance: Normal appearance.  HENT:     Head: Normocephalic and atraumatic.     Mouth/Throat:     Mouth: Mucous membranes are moist.  Eyes:     Extraocular Movements: Extraocular movements intact.     Pupils: Pupils are equal, round, and reactive to light.  Cardiovascular:     Rate and Rhythm: Normal rate and regular rhythm.     Pulses: Normal pulses.     Heart sounds: Normal heart sounds.  Pulmonary:     Effort: Pulmonary effort is normal.     Breath sounds: Normal breath sounds.  Abdominal:     General: Bowel sounds are normal.     Palpations: Abdomen is soft.     Tenderness: There is no abdominal tenderness.  Musculoskeletal:        General: No swelling.     Right lower leg: No edema.     Left lower leg: No edema.  Lymphadenopathy:     Cervical: No cervical adenopathy.  Skin:    General: Skin is warm and dry.  Neurological:     General: No focal deficit present.     Mental Status: She is alert and oriented to person, place, and time.  Psychiatric:        Mood and Affect: Mood normal.        Behavior: Behavior normal.     PAST MEDICAL/SURGICAL HISTORY:  Past Medical History:  Diagnosis Date   Anemia    CIN II (cervical intraepithelial neoplasia II) 2008   Colpo/Leep    Depression    STD (sexually transmitted disease)    Trichomonas,    Vaginal Pap smear, abnormal    Past Surgical History:  Procedure Laterality Date   CHOLECYSTECTOMY N/A 09/25/2013   Procedure: LAPAROSCOPIC CHOLECYSTECTOMY;  Surgeon: Jamesetta So, MD;  Location: [facility];  Service: General;  Laterality: N/A;   DILATION AND CURETTAGE OF UTERUS N/A 07/09/1998   and 02/07/2003   ESOPHAGOGASTRODUODENOSCOPY N/A 09/15/2013   Procedure: ESOPHAGOGASTRODUODENOSCOPY (EGD);  Surgeon: Danie Binder, MD;  Location: [facility] SUITE;   Service: Endoscopy;  Laterality: N/A;  11:30-moved to [place] notified pt   LAPAROSCOPIC BILATERAL SALPINGECTOMY Bilateral 11/10/2014   Procedure: LAPAROSCOPIC BILATERAL SALPINGECTOMY;  Surgeon: Florian Buff, MD;  Location: [facility];  Service: Gynecology;  Laterality: Bilateral;   TUBAL LIGATION      SOCIAL HISTORY:  Social History   Socioeconomic History   Marital status: Single    Spouse name: Not on file   Number of children: Not on file   Years of education: Not on file   Highest education level: Not on file  Occupational History   Occupation: Home Health  Tobacco Use   Smoking status: Never   Smokeless tobacco: Never  Vaping Use   Vaping Use: Never used  Substance and Sexual Activity   Alcohol use: No   Drug use: No   Sexual activity: Yes    Birth control/protection: Patch, Surgical    Comment: tubal  Other Topics Concern   Not on file  Social History Narrative   In school for medical office/nursing.    Live in [place].   Has 3 children, 17, 13, 11 ages.    No tobacco use.    Works 7 days a week.   Social Determinants of Health   Financial Resource Strain: Low Risk  (11/30/2021)   Overall Financial Resource Strain (CARDIA)    Difficulty of Paying Living Expenses: Not hard at all  Food Insecurity: No Food Insecurity (11/30/2021)   Hunger Vital Sign    Worried About Running Out of Food in the Last Year: Never true    Ran Out of Food in the Last Year: Never true  Transportation Needs: No Transportation Needs (11/30/2021)   PRAPARE - Administrator, Civil Service (Medical): No    Lack of Transportation (Non-Medical): No  Physical Activity: Inactive (11/30/2021)   Exercise Vital Sign    Days of Exercise per Week: 0 days    Minutes of Exercise per Session: 0 min  Stress: Stress Concern Present (11/30/2021)   [employer] of Occupational Health - Occupational Stress Questionnaire    Feeling of Stress : Rather much  Social Connections: Socially  Isolated (11/30/2021)   Social Connection and Isolation Panel [NHANES]    Frequency of Communication with Friends and Family: More than three times a week    Frequency of Social Gatherings with Friends and Family: Once a week    Attends Religious Services: Never    Database administrator or Organizations: No    Attends Banker Meetings: Never    Marital Status: Never married  Intimate Partner Violence: Not At Risk (11/30/2021)   Humiliation, Afraid, Rape, and Kick questionnaire    Fear of Current or Ex-Partner: No    Emotionally Abused: No    Physically Abused: No    Sexually Abused: No    FAMILY HISTORY:  Family History  Problem Relation Age of Onset   Hypertension Mother    Alcohol abuse Mother    Depression Mother    Stomach cancer Father        stomach    Hypertension Brother    Alcohol abuse Brother    ADD / ADHD Son    Anxiety disorder Brother    Depression Brother    Bipolar disorder Sister    Diabetes Maternal Grandmother    Drug abuse Neg Hx    Dementia Neg Hx    OCD Neg Hx    Paranoid behavior Neg Hx    Schizophrenia Neg Hx    Seizures Neg Hx    Sexual abuse Neg Hx    Physical abuse Neg Hx    Colon cancer Neg Hx     CURRENT MEDICATIONS:  Outpatient Encounter Medications as of 07/26/2022  Medication Sig   busPIRone (BUSPAR) 5 MG tablet Take 1 tablet (5 mg total) by mouth 3 (three) times daily. (Patient not taking: Reported on 11/30/2021)   folic acid (FOLVITE) 1 MG tablet Take 1 tablet (1 mg total) by mouth daily.   mirtazapine (REMERON) 15 MG tablet Take 15 mg by mouth at bedtime.   norelgestromin-ethinyl estradiol (ZAFEMY) 150-35 MCG/24HR transdermal patch APPLY 1 PATCH TO THE SKIN ONCE WEEKLY. CONTINUOUS USE NO BREAK FROM PATCH.   No facility-administered encounter medications on file as of 07/26/2022.    ALLERGIES:  Allergies  Allergen Reactions   Macrobid [Nitrofurantoin Macrocrystal] Itching    LABORATORY DATA:  I have reviewed  the  labs as listed.  CBC    Component Value Date/Time   WBC 9.0 07/12/2022 1429   RBC 4.90 07/12/2022 1429   HGB 11.3 (L) 07/12/2022 1429   HCT 37.2 07/12/2022 1429   PLT 292 07/12/2022 1429   MCV 75.9 (L) 07/12/2022 1429   MCH 23.1 (L) 07/12/2022 1429   MCHC 30.4 07/12/2022 1429   RDW 15.6 (H) 07/12/2022 1429   LYMPHSABS 3.1 07/12/2022 1429   MONOABS 0.5 07/12/2022 1429   EOSABS 0.1 07/12/2022 1429   BASOSABS 0.0 07/12/2022 1429      Latest Ref Rng & Units 11/25/2017    3:54 PM 09/21/2016   12:25 PM 03/23/2016    3:50 PM  CMP  Glucose 65 - 139 mg/dL 103  122  76   BUN 7 - 25 mg/dL 13  11  11    Creatinine 0.50 - 1.10 mg/dL 0.87  0.66  0.81   Sodium 135 - 146 mmol/L 140  139  139   Potassium 3.5 - 5.3 mmol/L 3.7  3.9  3.9   Chloride 98 - 110 mmol/L 107  106  105   CO2 20 - 32 mmol/L 27  23  27    Calcium 8.6 - 10.2 mg/dL 9.0  8.6  8.7   Total Protein 6.1 - 8.1 g/dL 7.4  7.1    Total Bilirubin 0.2 - 1.2 mg/dL 0.3  0.4    Alkaline Phos 33 - 115 U/L  84    AST 10 - 30 U/L 13  14    ALT 6 - 29 U/L 10  11      DIAGNOSTIC IMAGING:  I have independently reviewed the relevant imaging and discussed with the patient.   WRAP UP:  All questions were answered. The patient knows to call the clinic with any problems, questions or concerns.  Medical decision making: Low  Time spent on visit: I spent 15 minutes counseling the patient face to face. The total time spent in the appointment was 22 minutes and more than 50% was on counseling.  Harriett Rush, PA-C  07/26/22 9:18 AM

## 2022-07-26 ENCOUNTER — Encounter: Payer: Self-pay | Admitting: Physician Assistant

## 2022-07-26 ENCOUNTER — Other Ambulatory Visit: Payer: Self-pay

## 2022-07-26 ENCOUNTER — Inpatient Hospital Stay: Payer: Medicaid Other | Attending: Physician Assistant | Admitting: Physician Assistant

## 2022-07-26 VITALS — BP 159/98 | HR 71 | Temp 97.6°F | Resp 18 | Wt 142.0 lb

## 2022-07-26 DIAGNOSIS — Z79899 Other long term (current) drug therapy: Secondary | ICD-10-CM | POA: Insufficient documentation

## 2022-07-26 DIAGNOSIS — D509 Iron deficiency anemia, unspecified: Secondary | ICD-10-CM | POA: Insufficient documentation

## 2022-07-26 DIAGNOSIS — R718 Other abnormality of red blood cells: Secondary | ICD-10-CM

## 2022-07-26 DIAGNOSIS — E538 Deficiency of other specified B group vitamins: Secondary | ICD-10-CM | POA: Diagnosis not present

## 2022-07-26 DIAGNOSIS — E611 Iron deficiency: Secondary | ICD-10-CM

## 2022-07-26 MED ORDER — FERROUS SULFATE 325 (65 FE) MG PO TBEC: 325.0000 mg | DELAYED_RELEASE_TABLET | Freq: Every day | ORAL | 3 refills | Status: DC

## 2022-07-26 MED ORDER — FOLIC ACID 1 MG PO TABS: 1.0000 mg | ORAL_TABLET | Freq: Every day | ORAL | 3 refills | Status: DC

## 2022-07-26 NOTE — Patient Instructions (Signed)
Robin Delgado at [facility] Discharge Instructions  You were seen today by Tarri Abernethy PA-C for your anemia and abnormal blood cells.  ABNORMAL BLOOD CELLS: Your red blood cells tend to be small.  Therefore, your body compensates by making an increased number of red blood cells. This is caused by a condition called "alpha thalassemia trait."  This means that you most likely have a single genetic mutation that causes your body to make abnormal hemoglobin (the main protein in your blood).  However, your body has another copy of the gene that makes normal hemoglobin, so that is able to compensate for the abnormality. This condition is not dangerous to you, and does not need to be treated.   This trait can be passed on to your children and grandchildren.  They should discuss any blood abnormalities with their own doctors if needed.   We will check a test at your next visit to confirm whether or not you have alpha thalassemia trait.  Your iron levels are slightly low.  I recommend that you start taking over-the-counter iron supplementation (FERROUS SULFATE 325 mg) once daily.  Take this with a glass of orange juice to help your body absorb it better.  If it causes any constipation, you can take an over-the-counter stool softener to keep your bowel movements regular.  Your folic acid levels are also low.  Recommend that you start taking over-the-counter folic acid supplement 500 mcg daily.   LABS: Return in 6 months for repeat labs  FOLLOW-UP APPOINTMENT: Office visit in 6 months, after labs   Thank you for choosing [facility] to provide your oncology and hematology care.  To afford each patient quality time with our provider, please arrive at least 15 minutes before your scheduled appointment time.   If you have a lab appointment with the [facility] please come in thru the Main Entrance and check in at the main information  desk.  You need to re-schedule your appointment should you arrive 10 or more minutes late.  We strive to give you quality time with our providers, and arriving late affects you and other patients whose appointments are after yours.  Also, if you no show three or more times for appointments you may be dismissed from the clinic at the providers discretion.     Again, thank you for choosing [facility].  Our hope is that these requests will decrease the amount of time that you wait before being seen by our physicians.       _____________________________________________________________  Should you have questions after your visit to [facility], please contact our office at [phone] and follow the prompts.  Our office hours are 8:00 a.m. and 4:30 p.m. Monday - Friday.  Please note that voicemails left after 4:00 p.m. may not be returned until the following business day.  We are closed weekends and major holidays.  You do have access to a nurse 24-7, just call the main number to the clinic [phone] and do not press any options, hold on the line and a nurse will answer the phone.    For prescription refill requests, have your pharmacy contact our office and allow 72 hours.    Due to Covid, you will need to wear a mask upon entering the hospital. If you do not have a mask, a mask will be given to you at the Main Entrance upon arrival. For doctor visits,  patients may have 1 support person age 43 or older with them. For treatment visits, patients can not have anyone with them due to social distancing guidelines and our immunocompromised population.

## 2022-08-02 ENCOUNTER — Encounter: Payer: Self-pay | Admitting: Obstetrics & Gynecology

## 2022-08-08 ENCOUNTER — Other Ambulatory Visit: Payer: Self-pay | Admitting: Obstetrics & Gynecology

## 2022-08-08 DIAGNOSIS — N760 Acute vaginitis: Secondary | ICD-10-CM

## 2022-08-08 MED ORDER — METRONIDAZOLE 1 % EX GEL: CUTANEOUS | 2 refills | Status: DC

## 2022-08-08 NOTE — Progress Notes (Signed)
Rx for metrogel

## 2022-08-10 ENCOUNTER — Other Ambulatory Visit: Payer: Self-pay | Admitting: Adult Health

## 2022-08-10 MED ORDER — METRONIDAZOLE 0.75 % VA GEL: 1.0000 | Freq: Every day | VAGINAL | 0 refills | Status: DC

## 2022-08-10 NOTE — Progress Notes (Signed)
Rx sent for Metrogel vaginal gel

## 2022-08-30 ENCOUNTER — Ambulatory Visit: Payer: Medicaid Other | Admitting: Family Medicine

## 2022-08-30 ENCOUNTER — Encounter: Payer: Self-pay | Admitting: Family Medicine

## 2022-08-30 VITALS — BP 136/80 | HR 84 | Temp 97.9°F | Ht 64.0 in | Wt 148.0 lb

## 2022-08-30 DIAGNOSIS — F411 Generalized anxiety disorder: Secondary | ICD-10-CM | POA: Diagnosis not present

## 2022-08-30 DIAGNOSIS — L819 Disorder of pigmentation, unspecified: Secondary | ICD-10-CM | POA: Insufficient documentation

## 2022-08-30 MED ORDER — SERTRALINE HCL 50 MG PO TABS: 50.0000 mg | ORAL_TABLET | Freq: Every day | ORAL | 1 refills | Status: DC

## 2022-08-30 NOTE — Progress Notes (Signed)
Subjective:  Patient ID: Robin Delgado, female    DOB: [birth year]  Age: 42 y.o. MRN: [id number]  CC: Chief Complaint  Patient presents with   Establish Care    Anxiety discuss medication and therapy referral Rash under R arm 6 months    HPI:  42 year old female presents to establish care.  Patient reports that she is suffering from ongoing anxiety.  Patient is currently on Remeron for depression.  She would like to see a therapist as well.  She is interested in additional medication for anxiety.  She states that her anxiety is predominantly due to work and home stress.  Patient reports that she has had a rash to her right axilla for the past 6 months.  It is not raised or itchy.  It is not particular troublesome.  She would like me to examine this today.  Patient Active Problem List   Diagnosis Date Noted   Hyperpigmentation 08/30/2022   Moderate episode of recurrent major depressive disorder ([place]) 11/25/2017   Anemia, iron deficiency 11/11/2012   Generalized anxiety disorder 09/17/2012   Insomnia 08/13/2012    Social Hx   Social History   Socioeconomic History   Marital status: Single    Spouse name: Not on file   Number of children: Not on file   Years of education: Not on file   Highest education level: Not on file  Occupational History   Occupation: Home Health  Tobacco Use   Smoking status: Never   Smokeless tobacco: Never  Vaping Use   Vaping Use: Never used  Substance and Sexual Activity   Alcohol use: No   Drug use: No   Sexual activity: Yes    Birth control/protection: Patch, Surgical    Comment: tubal  Other Topics Concern   Not on file  Social History Narrative   In school for medical office/nursing.    Live in [place].   Has 3 children, 17, 18, 11 ages.    No tobacco use.    Works 7 days a week.   Social Determinants of Health   Financial Resource Strain: Low Risk  (11/30/2021)   Overall Financial Resource Strain (CARDIA)    Difficulty  of Paying Living Expenses: Not hard at all  Food Insecurity: No Food Insecurity (11/30/2021)   Hunger Vital Sign    Worried About Running Out of Food in the Last Year: Never true    Ran Out of Food in the Last Year: Never true  Transportation Needs: No Transportation Needs (11/30/2021)   PRAPARE - Hydrologist (Medical): No    Lack of Transportation (Non-Medical): No  Physical Activity: Inactive (11/30/2021)   Exercise Vital Sign    Days of Exercise per Week: 0 days    Minutes of Exercise per Session: 0 min  Stress: Stress Concern Present (11/30/2021)   [place]    Feeling of Stress : Rather much  Social Connections: Socially Isolated (11/30/2021)   Social Connection and Isolation Panel [NHANES]    Frequency of Communication with Friends and Family: More than three times a week    Frequency of Social Gatherings with Friends and Family: Once a week    Attends Religious Services: Never    Marine scientist or Organizations: No    Attends Archivist Meetings: Never    Marital Status: Never married    Review of Systems Per HPI  Objective:  BP 136/80  Pulse 84   Temp 97.9 F (36.6 C)   Ht 5' 4"$  (1.626 m)   Wt 148 lb (67.1 kg)   LMP 08/07/2022   SpO2 99%   BMI 25.40 kg/m      08/30/2022    9:46 AM 08/30/2022    9:34 AM 07/26/2022    8:39 AM  BP/Weight  Systolic BP [id number]  Diastolic BP 80 86 98  Wt. (Lbs)  148 141.98  BMI  25.4 kg/m2 24.37 kg/m2    Physical Exam Vitals and nursing note reviewed.  Constitutional:      General: She is not in acute distress.    Appearance: Normal appearance.  HENT:     Head: Normocephalic and atraumatic.  Cardiovascular:     Rate and Rhythm: Normal rate and regular rhythm.  Pulmonary:     Effort: Pulmonary effort is normal.     Breath sounds: Normal breath sounds. No wheezing, rhonchi or rales.  Neurological:     Mental  Status: She is alert.  Psychiatric:        Mood and Affect: Mood normal.        Behavior: Behavior normal.     Lab Results  Component Value Date   WBC 9.0 07/12/2022   HGB 11.3 (L) 07/12/2022   HCT 37.2 07/12/2022   PLT 292 07/12/2022   GLUCOSE 103 11/25/2017   CHOL 144 11/25/2017   TRIG 83 11/25/2017   HDL 57 11/25/2017   LDLCALC 70 11/25/2017   ALT 10 11/25/2017   AST 13 11/25/2017   NA 140 11/25/2017   K 3.7 11/25/2017   CL 107 11/25/2017   CREATININE 0.87 11/25/2017   BUN 13 11/25/2017   CO2 27 11/25/2017   TSH 1.402 10/05/2021   HGBA1C 5.9 (H) 11/25/2017     Assessment & Plan:   Problem List Items Addressed This Visit       Musculoskeletal and Integument   Hyperpigmentation    Advised to watch closely.  Since this is not particular troublesome for her, there is no need for further intervention at this time.        Other   Generalized anxiety disorder - Primary (Chronic)    Referring to psychology.  Starting Zoloft.      Relevant Medications   sertraline (ZOLOFT) 50 MG tablet   Other Relevant Orders   Ambulatory referral to Psychology    Meds ordered this encounter  Medications   sertraline (ZOLOFT) 50 MG tablet    Sig: Take 1 tablet (50 mg total) by mouth daily.    Dispense:  90 tablet    Refill:  1    Follow-up:  3-6 months  [place]

## 2022-08-30 NOTE — Assessment & Plan Note (Signed)
Advised to watch closely.  Since this is not particular troublesome for her, there is no need for further intervention at this time.

## 2022-08-30 NOTE — Patient Instructions (Addendum)
Medication as directed.  No need to be concerned about the mild hyperpigmentation.  Follow up in 3-6 months.

## 2022-08-30 NOTE — Assessment & Plan Note (Addendum)
Referring to psychology.  Starting Zoloft.

## 2022-09-11 ENCOUNTER — Encounter: Payer: Self-pay | Admitting: Family Medicine

## 2022-10-11 ENCOUNTER — Encounter: Payer: Self-pay | Admitting: Family Medicine

## 2022-10-12 ENCOUNTER — Other Ambulatory Visit: Payer: Self-pay | Admitting: Nurse Practitioner

## 2022-10-13 ENCOUNTER — Other Ambulatory Visit: Payer: Self-pay | Admitting: Nurse Practitioner

## 2022-10-13 MED ORDER — MIRTAZAPINE 15 MG PO TABS: 15.0000 mg | ORAL_TABLET | Freq: Every day | ORAL | 0 refills | Status: AC

## 2022-11-09 ENCOUNTER — Ambulatory Visit: Payer: Medicaid Other | Admitting: Family Medicine

## 2022-11-09 ENCOUNTER — Encounter: Payer: Self-pay | Admitting: Family Medicine

## 2022-11-09 VITALS — BP 125/83 | Temp 98.4°F | Ht 64.0 in | Wt 155.0 lb

## 2022-11-09 DIAGNOSIS — Z13220 Encounter for screening for lipoid disorders: Secondary | ICD-10-CM

## 2022-11-09 DIAGNOSIS — F411 Generalized anxiety disorder: Secondary | ICD-10-CM | POA: Diagnosis not present

## 2022-11-09 DIAGNOSIS — R7303 Prediabetes: Secondary | ICD-10-CM | POA: Diagnosis not present

## 2022-11-09 DIAGNOSIS — R079 Chest pain, unspecified: Secondary | ICD-10-CM | POA: Diagnosis not present

## 2022-11-09 DIAGNOSIS — D508 Other iron deficiency anemias: Secondary | ICD-10-CM

## 2022-11-09 MED ORDER — SERTRALINE HCL 50 MG PO TABS: 50.0000 mg | ORAL_TABLET | Freq: Every day | ORAL | 3 refills | Status: DC

## 2022-11-09 NOTE — Patient Instructions (Signed)
Medication as prescribed.  Labs today.   We will call with results.

## 2022-11-10 LAB — CMP14+EGFR
ALT: 11 IU/L (ref 0–32)
AST: 16 IU/L (ref 0–40)
Albumin/Globulin Ratio: 1.1 — ABNORMAL LOW (ref 1.2–2.2)
Albumin: 4.2 g/dL (ref 3.9–4.9)
Alkaline Phosphatase: 101 IU/L (ref 44–121)
BUN/Creatinine Ratio: 13 (ref 9–23)
BUN: 12 mg/dL (ref 6–24)
Bilirubin Total: 0.2 mg/dL (ref 0.0–1.2)
CO2: 24 mmol/L (ref 20–29)
Calcium: 9.3 mg/dL (ref 8.7–10.2)
Chloride: 103 mmol/L (ref 96–106)
Creatinine, Ser: 0.92 mg/dL (ref 0.57–1.00)
Globulin, Total: 3.7 g/dL (ref 1.5–4.5)
Glucose: 97 mg/dL (ref 70–99)
Potassium: 4.2 mmol/L (ref 3.5–5.2)
Sodium: 139 mmol/L (ref 134–144)
Total Protein: 7.9 g/dL (ref 6.0–8.5)
eGFR: 80 mL/min/{1.73_m2} (ref >59)

## 2022-11-10 LAB — CBC
Hematocrit: 40.8 % (ref 34.0–46.6)
Hemoglobin: 12.7 g/dL (ref 11.1–15.9)
MCH: 23.2 pg — ABNORMAL LOW (ref 26.6–33.0)
MCHC: 31.1 g/dL — ABNORMAL LOW (ref 31.5–35.7)
MCV: 75 fL — ABNORMAL LOW (ref 79–97)
Platelets: 355 10*3/uL (ref 150–450)
RBC: 5.48 x10E6/uL — ABNORMAL HIGH (ref 3.77–5.28)
RDW: 15 % (ref 11.7–15.4)
WBC: 10.2 10*3/uL (ref 3.4–10.8)

## 2022-11-10 LAB — HEMOGLOBIN A1C
Est. average glucose Bld gHb Est-mCnc: 126 mg/dL
Hgb A1c MFr Bld: 6 % — ABNORMAL HIGH (ref 4.8–5.6)

## 2022-11-10 LAB — LIPID PANEL
Chol/HDL Ratio: 2.9 ratio (ref 0.0–4.4)
Cholesterol, Total: 175 mg/dL (ref 100–199)
HDL: 61 mg/dL (ref >39)
LDL Chol Calc (NIH): 90 mg/dL (ref 0–99)
Triglycerides: 139 mg/dL (ref 0–149)
VLDL Cholesterol Cal: 24 mg/dL (ref 5–40)

## 2022-11-12 DIAGNOSIS — R7303 Prediabetes: Secondary | ICD-10-CM | POA: Insufficient documentation

## 2022-11-12 DIAGNOSIS — R079 Chest pain, unspecified: Secondary | ICD-10-CM | POA: Insufficient documentation

## 2022-11-12 NOTE — Progress Notes (Signed)
Subjective:  Patient ID: Robin Delgado, female    DOB: 03-26-1981  Age: 42 y.o. MRN: [id number]  CC: Chief Complaint  Patient presents with   Chest Pain    Anxiety and stress - wants to have a stress test  Reports elevated BP readings 181/101   request labs to be checked    HPI:  42 year old female presents for evaluation of the above.  Patient reports that she has been experiencing recent chest pain.  She states that her most recent bout was Sunday.  Located on the right side of the chest.  Brief, lasting only 3 minutes.  She believes that this is related to anxiety.  Seems to occur with anxiety provoking situations and stress.  Patient is concerned that she needs a stress test.  Patient has considerable difficulty with anxiety.  Will discuss this today.  Patient Active Problem List   Diagnosis Date Noted   Chest pain 11/12/2022   Prediabetes 11/12/2022   Hyperpigmentation 08/30/2022   Moderate episode of recurrent major depressive disorder (HCC) 11/25/2017   Anemia, iron deficiency 11/11/2012   Generalized anxiety disorder 09/17/2012   Insomnia 08/13/2012    Social Hx   Social History   Socioeconomic History   Marital status: Single    Spouse name: Not on file   Number of children: Not on file   Years of education: Not on file   Highest education level: Not on file  Occupational History   Occupation: Home Health  Tobacco Use   Smoking status: Never   Smokeless tobacco: Never  Vaping Use   Vaping Use: Never used  Substance and Sexual Activity   Alcohol use: No   Drug use: No   Sexual activity: Yes    Birth control/protection: Patch, Surgical    Comment: tubal  Other Topics Concern   Not on file  Social History Narrative   ** Merged History Encounter **       In school for medical office/nursing.  Live in [place]. Has 3 children, 17, 13, 11 ages.  No tobacco use.  Works 7 days a week.    Social Determinants of Health   Financial Resource  Strain: Low Risk  (11/30/2021)   Overall Financial Resource Strain (CARDIA)    Difficulty of Paying Living Expenses: Not hard at all  Food Insecurity: No Food Insecurity (11/30/2021)   Hunger Vital Sign    Worried About Running Out of Food in the Last Year: Never true    Ran Out of Food in the Last Year: Never true  Transportation Needs: No Transportation Needs (11/30/2021)   PRAPARE - Administrator, Civil Service (Medical): No    Lack of Transportation (Non-Medical): No  Physical Activity: Inactive (11/30/2021)   Exercise Vital Sign    Days of Exercise per Week: 0 days    Minutes of Exercise per Session: 0 min  Stress: Stress Concern Present (11/30/2021)   [employer] of Occupational Health - Occupational Stress Questionnaire    Feeling of Stress : Rather much  Social Connections: Socially Isolated (11/30/2021)   Social Connection and Isolation Panel [NHANES]    Frequency of Communication with Friends and Family: More than three times a week    Frequency of Social Gatherings with Friends and Family: Once a week    Attends Religious Services: Never    Database administrator or Organizations: No    Attends Banker Meetings: Never    Marital Status: Never married  Review of Systems Per HPI  Objective:  BP 125/83   Temp 98.4 F (36.9 C)   Ht 5\' 4"  (1.626 m)   Wt 155 lb (70.3 kg)   SpO2 98%   BMI 26.61 kg/m      11/09/2022    3:23 PM 08/30/2022    9:46 AM 08/30/2022    9:34 AM  BP/Weight  Systolic BP 125 136 142  Diastolic BP 83 80 86  Wt. (Lbs) 155  148  BMI 26.61 kg/m2  25.4 kg/m2    Physical Exam Vitals and nursing note reviewed.  Constitutional:      General: She is not in acute distress.    Appearance: Normal appearance.  Eyes:     General:        Right eye: No discharge.        Left eye: No discharge.     Conjunctiva/sclera: Conjunctivae normal.  Cardiovascular:     Rate and Rhythm: Normal rate and regular rhythm.  Pulmonary:      Effort: Pulmonary effort is normal.     Breath sounds: Normal breath sounds. No wheezing, rhonchi or rales.  Neurological:     Mental Status: She is alert.  Psychiatric:     Comments: Anxious.     Lab Results  Component Value Date   WBC 10.2 11/09/2022   HGB 12.7 11/09/2022   HCT 40.8 11/09/2022   PLT 355 11/09/2022   GLUCOSE 97 11/09/2022   CHOL 175 11/09/2022   TRIG 139 11/09/2022   HDL 61 11/09/2022   LDLCALC 90 11/09/2022   ALT 11 11/09/2022   AST 16 11/09/2022   NA 139 11/09/2022   K 4.2 11/09/2022   CL 103 11/09/2022   CREATININE 0.92 11/09/2022   BUN 12 11/09/2022   CO2 24 11/09/2022   TSH 1.402 10/05/2021   HGBA1C 6.0 (H) 11/09/2022   EKG -independent interpretation: Normal sinus rhythm with a rate of 75.  Normal axis.  Normal intervals.  No ST or T wave changes.  Normal EKG.  Assessment & Plan:   Problem List Items Addressed This Visit       Other   Generalized anxiety disorder (Chronic)    Uncontrolled/worsening.  Starting on Zoloft.      Relevant Medications   sertraline (ZOLOFT) 50 MG tablet   Anemia, iron deficiency   Relevant Orders   CBC (Completed)   Chest pain - Primary    Atypical.  Low suspicion for underlying cardiac etiology.  Normal EKG today.  Labs today.  Supportive care.  Treating underlying anxiety.      Relevant Orders   EKG 12-Lead (Completed)   CBC (Completed)   CMP14+EGFR (Completed)   Prediabetes   Relevant Orders   Hemoglobin A1c (Completed)   Other Visit Diagnoses     Screening, lipid       Relevant Orders   Lipid panel (Completed)       Meds ordered this encounter  Medications   sertraline (ZOLOFT) 50 MG tablet    Sig: Take 1 tablet (50 mg total) by mouth daily.    Dispense:  90 tablet    Refill:  3   Kaisa Wofford DO [facility]

## 2022-11-12 NOTE — Assessment & Plan Note (Signed)
Uncontrolled/worsening. Starting on Zoloft.  

## 2022-11-12 NOTE — Assessment & Plan Note (Signed)
Atypical.  Low suspicion for underlying cardiac etiology.  Normal EKG today.  Labs today.  Supportive care.  Treating underlying anxiety.

## 2022-12-06 ENCOUNTER — Other Ambulatory Visit (HOSPITAL_COMMUNITY)
Admission: RE | Admit: 2022-12-06 | Discharge: 2022-12-06 | Disposition: A | Discharge status: Home / Self Care | Payer: Medicaid Other | Source: Ambulatory Visit | Attending: Adult Health | Admitting: Adult Health

## 2022-12-06 ENCOUNTER — Ambulatory Visit (INDEPENDENT_AMBULATORY_CARE_PROVIDER_SITE_OTHER): Payer: Medicaid Other | Admitting: Adult Health

## 2022-12-06 ENCOUNTER — Encounter: Payer: Self-pay | Admitting: Adult Health

## 2022-12-06 VITALS — BP 135/86 | HR 74 | Ht 64.0 in | Wt 153.0 lb

## 2022-12-06 DIAGNOSIS — Z01419 Encounter for gynecological examination (general) (routine) without abnormal findings: Secondary | ICD-10-CM | POA: Diagnosis not present

## 2022-12-06 DIAGNOSIS — N898 Other specified noninflammatory disorders of vagina: Secondary | ICD-10-CM | POA: Insufficient documentation

## 2022-12-06 DIAGNOSIS — Z113 Encounter for screening for infections with a predominantly sexual mode of transmission: Secondary | ICD-10-CM

## 2022-12-06 DIAGNOSIS — Z7689 Persons encountering health services in other specified circumstances: Secondary | ICD-10-CM

## 2022-12-06 DIAGNOSIS — N939 Abnormal uterine and vaginal bleeding, unspecified: Secondary | ICD-10-CM

## 2022-12-06 MED ORDER — METRONIDAZOLE 0.75 % VA GEL: 1.0000 | Freq: Every day | VAGINAL | 1 refills | Status: DC

## 2022-12-06 MED ORDER — NORELGESTROMIN-ETH ESTRADIOL 150-35 MCG/24HR TD PTWK: MEDICATED_PATCH | TRANSDERMAL | 4 refills | Status: DC

## 2022-12-06 NOTE — Progress Notes (Signed)
Patient ID: Robin Delgado, female   DOB: [birth year], 42 y.o.   MRN: [id number] History of Present Illness: Robin Delgado is a 42 year old black female, single, G3P3, in for a well woman gyn exam.  Last pap was negative HPV,NILM 11/29/20   Current Medications, Allergies, Past Medical History, Past Surgical History, Family History and Social History were reviewed in [employer] record.     Review of Systems: Patient denies any headaches, hearing loss, fatigue, blurred vision, shortness of breath, chest pain, abdominal pain, problems with bowel movements, urination, or intercourse. No joint pain or mood swings.  Has history  recurrent BV   Physical Exam:BP 135/86 (BP Location: Left Arm, Patient Position: Sitting, Cuff Size: Normal)   Pulse 74   Ht 5\' 4"  (1.626 m)   Wt 153 lb (69.4 kg)   LMP 11/03/2022   BMI 26.26 kg/m   General:  Well developed, well nourished, no acute distress Skin:  Warm and dry Neck:  Midline trachea, normal thyroid, good ROM, no lymphadenopathy Lungs; Clear to auscultation bilaterally Breast:  No dominant palpable mass, retraction, or nipple discharge, right nipple inverted,mildly,(chronic) Cardiovascular: Regular rate and rhythm Abdomen:  Soft, non tender, no hepatosplenomegaly Pelvic:  External genitalia is normal in appearance, no lesions.  The vagina is normal in appearance, has white discharge with slight odor. Urethra has no lesions or masses. The cervix is bulbous.  Uterus is felt to be normal size, shape, and contour.  No adnexal masses or tenderness noted.Bladder is non tender, no masses felt. CV swab obtained.  Rectal:pt declines exam Extremities/musculoskeletal:  No swelling or varicosities noted, no clubbing or cyanosis Psych:  No mood changes, alert and cooperative,seems happy AA is 0 Fall risk is low    12/06/2022   10:44 AM 11/09/2022    3:30 PM 08/30/2022    9:55 AM  Depression screen PHQ 2/9  Decreased Interest 1 2 1    Down, Depressed, Hopeless 0 0 0  PHQ - 2 Score 1 2 1   Altered sleeping 2 0 2  Tired, decreased energy 1 3 3   Change in appetite 0 0 0  Feeling bad or failure about yourself  0 0 0  Trouble concentrating 0 0 1  Moving slowly or fidgety/restless 0 0 2  Suicidal thoughts 0 0 0  PHQ-9 Score 4 5 9   Difficult doing work/chores  Somewhat difficult Not difficult at all   She is on remeron, not taking Zoloft    12/06/2022   10:44 AM 11/09/2022    3:30 PM 08/30/2022    9:55 AM 11/30/2021    9:01 AM  GAD 7 : Generalized Anxiety Score  Nervous, Anxious, on Edge 3 3 3 2   Control/stop worrying 3 3 3 3   Worry too much - different things 3 3 3 3   Trouble relaxing 3 3 2 3   Restless 0 0 1 3  Easily annoyed or irritable 3 3 3 3   Afraid - awful might happen 3 3 2 3   Total GAD 7 Score 18 18 17 20   Anxiety Difficulty  Somewhat difficult Not difficult at all   Discussed could try Buspar, she never gave answer     [place] - 12/06/22 1118       Pregnancy Intention Screening   Does the patient want to become pregnant in the next year? No    Does the patient's partner want to become pregnant in the next year? No    Would the patient like to discuss  contraceptive options today? No      Contraception Wrap Up   Current Method Contraceptive Patch;Female Sterilization   Simultaneous filing. User may not have seen previous data.   End Method Contraceptive Patch;Female Sterilization   Simultaneous filing. User may not have seen previous data.   Contraception Counseling Provided No            Examination chaperoned by Tish RN   Impression and plan:   1. Encounter for well woman exam with routine gynecological exam Pap and physical in 1 year Labs with PCP Mammogram was negative 01/11/22  2. Vaginal discharge + vaginal discharge with odor Will send CV swab Will rx Metrogel  - Cervicovaginal ancillary only( [place])  3. Screening examination for STD (sexually transmitted disease) CV swab  sent for GC/CHL,trich,BV and yeast  - Cervicovaginal ancillary only( [facility])  4. Encounter for menstrual regulation Uses zafemy, periods better  Meds ordered this encounter  Medications   norelgestromin-ethinyl estradiol (ZAFEMY) 150-35 MCG/24HR transdermal patch    Sig: APPLY 1 PATCH TO THE SKIN ONCE WEEKLY. CONTINUOUS USE NO BREAK FROM PATCH.    Dispense:  9 patch    Refill:  4    Order Specific Question:   Supervising Provider    Answer:   Duane Lope H [[id number]]   metroNIDAZOLE (METROGEL) 0.75 % vaginal gel    Sig: Place 1 Applicatorful vaginally at bedtime.    Dispense:  70 g    Refill:  1    Order Specific Question:   Supervising Provider    Answer:   EURE, LUTHER H [[id number]]     5. Abnormal uterine bleeding,history of.  Bleeding normal now on the patch

## 2022-12-07 ENCOUNTER — Other Ambulatory Visit: Payer: Self-pay | Admitting: Adult Health

## 2022-12-07 LAB — CERVICOVAGINAL ANCILLARY ONLY
Bacterial Vaginitis (gardnerella): POSITIVE — AB
Candida Glabrata: NEGATIVE
Candida Vaginitis: POSITIVE — AB
Chlamydia: NEGATIVE
Comment: NEGATIVE
Comment: NEGATIVE
Comment: NEGATIVE
Comment: NEGATIVE
Comment: NEGATIVE
Comment: NORMAL
Neisseria Gonorrhea: NEGATIVE
Trichomonas: NEGATIVE

## 2022-12-07 MED ORDER — FLUCONAZOLE 150 MG PO TABS: ORAL_TABLET | ORAL | 1 refills | Status: DC

## 2022-12-27 ENCOUNTER — Other Ambulatory Visit (HOSPITAL_COMMUNITY): Payer: Self-pay | Admitting: Adult Health

## 2022-12-27 DIAGNOSIS — R921 Mammographic calcification found on diagnostic imaging of breast: Secondary | ICD-10-CM

## 2023-01-02 ENCOUNTER — Inpatient Hospital Stay: Payer: Medicaid Other | Attending: Physician Assistant

## 2023-01-02 ENCOUNTER — Other Ambulatory Visit: Payer: Self-pay

## 2023-01-02 DIAGNOSIS — E538 Deficiency of other specified B group vitamins: Secondary | ICD-10-CM | POA: Diagnosis not present

## 2023-01-02 DIAGNOSIS — R718 Other abnormality of red blood cells: Secondary | ICD-10-CM

## 2023-01-02 DIAGNOSIS — Z79899 Other long term (current) drug therapy: Secondary | ICD-10-CM | POA: Insufficient documentation

## 2023-01-02 DIAGNOSIS — D509 Iron deficiency anemia, unspecified: Secondary | ICD-10-CM | POA: Diagnosis present

## 2023-01-02 DIAGNOSIS — E611 Iron deficiency: Secondary | ICD-10-CM

## 2023-01-02 LAB — IRON AND TIBC
Iron: 83 ug/dL (ref 28–170)
Saturation Ratios: 21 % (ref 10.4–31.8)
TIBC: 396 ug/dL (ref 250–450)
UIBC: 313 ug/dL

## 2023-01-02 LAB — CBC WITH DIFFERENTIAL/PLATELET
Abs Immature Granulocytes: 0.01 10*3/uL (ref 0.00–0.07)
Basophils Absolute: 0 10*3/uL (ref 0.0–0.1)
Basophils Relative: 0 %
Eosinophils Absolute: 0.1 10*3/uL (ref 0.0–0.5)
Eosinophils Relative: 1 %
HCT: 39 % (ref 36.0–46.0)
Hemoglobin: 11.9 g/dL — ABNORMAL LOW (ref 12.0–15.0)
Immature Granulocytes: 0 %
Lymphocytes Relative: 33 %
Lymphs Abs: 2.6 10*3/uL (ref 0.7–4.0)
MCH: 22.8 pg — ABNORMAL LOW (ref 26.0–34.0)
MCHC: 30.5 g/dL (ref 30.0–36.0)
MCV: 74.9 fL — ABNORMAL LOW (ref 80.0–100.0)
Monocytes Absolute: 0.3 10*3/uL (ref 0.1–1.0)
Monocytes Relative: 4 %
Neutro Abs: 4.8 10*3/uL (ref 1.7–7.7)
Neutrophils Relative %: 62 %
Platelets: 303 10*3/uL (ref 150–400)
RBC: 5.21 MIL/uL — ABNORMAL HIGH (ref 3.87–5.11)
RDW: 15.7 % — ABNORMAL HIGH (ref 11.5–15.5)
WBC: 7.8 10*3/uL (ref 4.0–10.5)
nRBC: 0 % (ref 0.0–0.2)

## 2023-01-02 LAB — FOLATE: Folate: 6 ng/mL (ref >5.9)

## 2023-01-02 LAB — FERRITIN: Ferritin: 38 ng/mL (ref 11–307)

## 2023-01-03 ENCOUNTER — Inpatient Hospital Stay: Payer: Medicaid Other

## 2023-01-15 ENCOUNTER — Ambulatory Visit (HOSPITAL_COMMUNITY): Admission: RE | Admit: 2023-01-15 | Payer: Medicaid Other | Source: Ambulatory Visit

## 2023-01-15 ENCOUNTER — Encounter (HOSPITAL_COMMUNITY): Payer: Self-pay

## 2023-01-15 ENCOUNTER — Ambulatory Visit (HOSPITAL_COMMUNITY)
Admission: RE | Admit: 2023-01-15 | Discharge: 2023-01-15 | Disposition: A | Discharge status: Home / Self Care | Payer: Medicaid Other | Source: Ambulatory Visit | Attending: Adult Health | Admitting: Adult Health

## 2023-01-15 DIAGNOSIS — R921 Mammographic calcification found on diagnostic imaging of breast: Secondary | ICD-10-CM | POA: Diagnosis not present

## 2023-01-21 LAB — ALPHA-THALASSEMIA GENOTYPR

## 2023-01-23 NOTE — Progress Notes (Signed)
VIRTUAL VISIT via TELEPHONE NOTE [facility]   I connected with Robin Delgado  on 01/24/23 at  2:56 PM by telephone and verified that I am speaking with the correct person using two identifiers.  Location: Patient: Home Provider: [facility]   I discussed the limitations, risks, security and privacy concerns of performing an evaluation and management service by telephone and the availability of in person appointments. I also discussed with the patient that there may be a patient responsible charge related to this service. The patient expressed understanding and agreed to proceed.  REASON FOR VISIT:  Follow-up for microcytosis, iron deficiency, folic acid deficiency   PRIOR THERAPY: None   CURRENT THERAPY: Daily iron tablet + folic acid supplement  INTERVAL HISTORY:  Ms. Robin Delgado is contacted today for follow-up of microcytosis, iron deficiency, and folic acid deficiency.  She was last seen by Rojelio Brenner PA-C on 07/26/2022.   At today's visit, she reports feeling fair apart from ongoing fatigue.  She tried taking iron tablet, but was unable to tolerate this due to nausea.  She denies any rectal bleeding or melena.  She continues to have moderate rectal bleeding for 5 out of every 28 days. She denies any pica, restless legs, headaches, chest pain, dyspnea on exertion, lightheadedness, or syncope.   She has little to no energy and 100% appetite. She endorses that she is maintaining a stable weight.  REVIEW OF SYSTEMS:   Review of Systems  Constitutional:  Positive for malaise/fatigue. Negative for chills, diaphoresis, fever and weight loss.  Respiratory:  Negative for cough and shortness of breath.   Cardiovascular:  Negative for chest pain and palpitations.  Gastrointestinal:  Negative for abdominal pain, blood in stool, melena, nausea and vomiting.  Neurological:  Negative for dizziness and headaches.     PHYSICAL EXAM: (per  limitations of virtual telephone visit)  The patient is alert and oriented x 3, exhibiting adequate mentation, good mood, and ability to speak in full sentences and execute sound judgement.  ASSESSMENT & PLAN:  1.   Chronic microcytosis: - Microcytosis and elevated RBC since at least 2014, with MCV ranging from 69.7 to 76.8, iron proportion to hemoglobin - She reports that her sister and father have some sort of thalassemia - Hemoglobin fractionation cascade was within normal limits, but this does not exclude the possibility of alpha thalassemia trait - Alpha thalassemia genotype (01/02/2023) reveals alpha thalassemia trait. - Differential diagnosis favors combination of suspected alpha thalassemia trait as well as moderate iron deficiency (addressed below)   - PLAN: Discussed diagnosis of alpha thalassemia trait, particularly that this is unlikely to cause her any problems but could have reproductive impact on offspring.   2.  Iron deficiency, symptomatic - Work-up of fatigue and microcytosis revealed moderate iron deficiency (ferritin 30, iron saturation 18%) and folate deficiency (folate 4.8).  B12/MMA, folate, copper normal.  Normal TSH. - She has menstrual bleeding 5 of every 28 days, moderate. - No bright red blood per rectum - She was unable to tolerate oral iron supplement due to nausea - Most recent labs (01/02/2023): Hgb 11.9/MCV 74.9/RBC 5.21.  Ferritin 38, iron saturation 21% - PLAN: Recommend IV Feraheme x 1 due to symptomatic iron deficiency Discussed potential risks and side effects of IV iron, including risk of allergic reaction.  Patient is agreeable to proceed.  - Repeat CBC/D, ferritin, iron/TIBC, folate in 6 months with office visit after   3.  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  She has 3 older children.  She is a non-smoker. - Sister and her niece have thalassemia trait.  Father died of bone cancer but could have thalassemia trait.  Maternal aunt died  of leukemia.  PLAN SUMMARY: >> IV Feraheme x 1 >> Labs in 6 months = CBC/D, ferritin, iron/TIBC >> OFFICE visit in 6 months     I discussed the assessment and treatment plan with the patient. The patient was provided an opportunity to ask questions and all were answered. The patient agreed with the plan and demonstrated an understanding of the instructions.   The patient was advised to call back or seek an in-person evaluation if the symptoms worsen or if the condition fails to improve as anticipated.  I provided 22 minutes of non-face-to-face time during this encounter.  Carnella Guadalajara, PA-C 01/24/23 3:41 PM

## 2023-01-24 ENCOUNTER — Inpatient Hospital Stay: Payer: Medicaid Other | Attending: Physician Assistant | Admitting: Physician Assistant

## 2023-01-24 ENCOUNTER — Encounter: Payer: Self-pay | Admitting: Physician Assistant

## 2023-01-24 DIAGNOSIS — D563 Thalassemia minor: Secondary | ICD-10-CM | POA: Diagnosis not present

## 2023-01-24 DIAGNOSIS — E611 Iron deficiency: Secondary | ICD-10-CM | POA: Diagnosis not present

## 2023-01-24 HISTORY — DX: Iron deficiency: E61.1

## 2023-02-01 ENCOUNTER — Ambulatory Visit: Payer: Medicaid Other

## 2023-02-14 ENCOUNTER — Inpatient Hospital Stay: Payer: Medicaid Other | Attending: Physician Assistant

## 2023-02-14 VITALS — BP 146/78 | HR 82 | Temp 98.1°F | Resp 18

## 2023-02-14 DIAGNOSIS — E611 Iron deficiency: Secondary | ICD-10-CM

## 2023-02-14 MED ORDER — ACETAMINOPHEN 325 MG PO TABS
650.0000 mg | ORAL_TABLET | Freq: Once | ORAL | Status: DC
  Filled 2023-02-14: qty 2

## 2023-02-14 MED ORDER — SODIUM CHLORIDE 0.9 % IV SOLN: Freq: Once | INTRAVENOUS | Status: AC

## 2023-02-14 MED ORDER — CETIRIZINE HCL 10 MG PO TABS
10.0000 mg | ORAL_TABLET | Freq: Once | ORAL | Status: DC
  Filled 2023-02-14: qty 1

## 2023-02-14 MED ORDER — SODIUM CHLORIDE 0.9 % IV SOLN
510.0000 mg | Freq: Once | INTRAVENOUS | Status: DC
  Filled 2023-02-14: qty 17

## 2023-02-14 NOTE — Patient Instructions (Signed)
Ferumoxytol Injection What is this medication? FERUMOXYTOL (FER ue MOX i tol) treats low levels of iron in your body (iron deficiency anemia). Iron is a mineral that plays an important role in making red blood cells, which carry oxygen from your lungs to the rest of your body. This medicine may be used for other purposes; ask your health care provider or pharmacist if you have questions. COMMON BRAND NAME(S): Feraheme What should I tell my care team before I take this medication? They need to know if you have any of these conditions: Anemia not caused by low iron levels High levels of iron in the blood Magnetic resonance imaging (MRI) test scheduled An unusual or allergic reaction to iron, other medications, foods, dyes, or preservatives Pregnant or trying to get pregnant Breastfeeding How should I use this medication? This medication is injected into a vein. It is given by your care team in a hospital or clinic setting. Talk to your care team the use of this medication in children. Special care may be needed. Overdosage: If you think you have taken too much of this medicine contact a poison control center or emergency room at once. NOTE: This medicine is only for you. Do not share this medicine with others. What if I miss a dose? It is important not to miss your dose. Call your care team if you are unable to keep an appointment. What may interact with this medication? Other iron products This list may not describe all possible interactions. Give your health care provider a list of all the medicines, herbs, non-prescription drugs, or dietary supplements you use. Also tell them if you smoke, drink alcohol, or use illegal drugs. Some items may interact with your medicine. What should I watch for while using this medication? Visit your care team regularly. Tell your care team if your symptoms do not start to get better or if they get worse. You may need blood work done while you are taking this  medication. You may need to follow a special diet. Talk to your care team. Foods that contain iron include: whole grains/cereals, dried fruits, beans, or peas, leafy green vegetables, and organ meats (liver, kidney). What side effects may I notice from receiving this medication? Side effects that you should report to your care team as soon as possible: Allergic reactions--skin rash, itching, hives, swelling of the face, lips, tongue, or throat Low blood pressure--dizziness, feeling faint or lightheaded, blurry vision Shortness of breath Side effects that usually do not require medical attention (report to your care team if they continue or are bothersome): Flushing Headache Joint pain Muscle pain Nausea Pain, redness, or irritation at injection site This list may not describe all possible side effects. Call your doctor for medical advice about side effects. You may report side effects to FDA at 1-800-FDA-1088. Where should I keep my medication? This medication is given in a hospital or clinic. It will not be stored at home. NOTE: This sheet is a summary. It may not cover all possible information. If you have questions about this medicine, talk to your doctor, pharmacist, or health care provider.  2024 Elsevier/Gold Standard (2022-11-30 00:00:00)

## 2023-02-14 NOTE — Progress Notes (Signed)
Feraheme instructions given to the patient for review.   Patient declined iron infusion due to possible side effects.  She did not take her pre meds. Peripheral IV discontinued and patient left with no s/s of distress noted.

## 2023-03-27 ENCOUNTER — Other Ambulatory Visit (INDEPENDENT_AMBULATORY_CARE_PROVIDER_SITE_OTHER): Payer: Medicaid Other | Admitting: *Deleted

## 2023-03-27 ENCOUNTER — Other Ambulatory Visit (HOSPITAL_COMMUNITY)
Admission: RE | Admit: 2023-03-27 | Discharge: 2023-03-27 | Disposition: A | Discharge status: Home / Self Care | Payer: Medicaid Other | Source: Ambulatory Visit | Attending: Obstetrics & Gynecology | Admitting: Obstetrics & Gynecology

## 2023-03-27 DIAGNOSIS — N898 Other specified noninflammatory disorders of vagina: Secondary | ICD-10-CM | POA: Diagnosis present

## 2023-03-27 NOTE — Progress Notes (Signed)
   NURSE VISIT- VAGINITIS/STD/POC  SUBJECTIVE:  Robin Delgado is a 42 y.o. G3P3000 GYN patientfemale here for a vaginal swab for vaginitis screening.  She reports the following symptoms: odor for 3 weeks. Denies abnormal vaginal bleeding, significant pelvic pain, fever, or UTI symptoms.  OBJECTIVE:  There were no vitals taken for this visit.  Appears well, in no apparent distress  ASSESSMENT: Vaginal swab for vaginitis screening  PLAN: Self-collected vaginal probe for Gonorrhea, Chlamydia, Trichomonas, Bacterial Vaginosis, Yeast sent to lab Treatment: to be determined once results are received Follow-up as needed if symptoms persist/worsen, or new symptoms develop  Annamarie Dawley  03/27/2023 3:12 PM

## 2023-03-29 ENCOUNTER — Other Ambulatory Visit: Payer: Self-pay | Admitting: Adult Health

## 2023-03-29 LAB — CERVICOVAGINAL ANCILLARY ONLY
Bacterial Vaginitis (gardnerella): POSITIVE — AB
Candida Glabrata: NEGATIVE
Candida Vaginitis: NEGATIVE
Chlamydia: NEGATIVE
Comment: NEGATIVE
Comment: NEGATIVE
Comment: NEGATIVE
Comment: NEGATIVE
Comment: NEGATIVE
Comment: NORMAL
Neisseria Gonorrhea: NEGATIVE
Trichomonas: NEGATIVE

## 2023-03-29 MED ORDER — METRONIDAZOLE 500 MG PO TABS: 500.0000 mg | ORAL_TABLET | Freq: Two times a day (BID) | ORAL | 0 refills | Status: DC

## 2023-03-29 NOTE — Progress Notes (Signed)
+  BV on vaginal swab will rx flagyl,no sex or alcohol while taking  ?

## 2023-05-15 IMAGING — MG MM DIGITAL SCREENING BILAT W/ TOMO AND CAD
6 of 10 series · 6 of 30 positions shown · non-contrast
Comparison: None.

CLINICAL DATA: Screening. Baseline.

EXAM:
DIGITAL SCREENING BILATERAL MAMMOGRAM WITH TOMOSYNTHESIS AND CAD
TECHNIQUE: Bilateral screening digital craniocaudal and mediolateral oblique
mammograms were obtained. Bilateral screening digital breast
tomosynthesis was performed. The images were evaluated with
computer-aided detection.

[L MLO synth-2D]
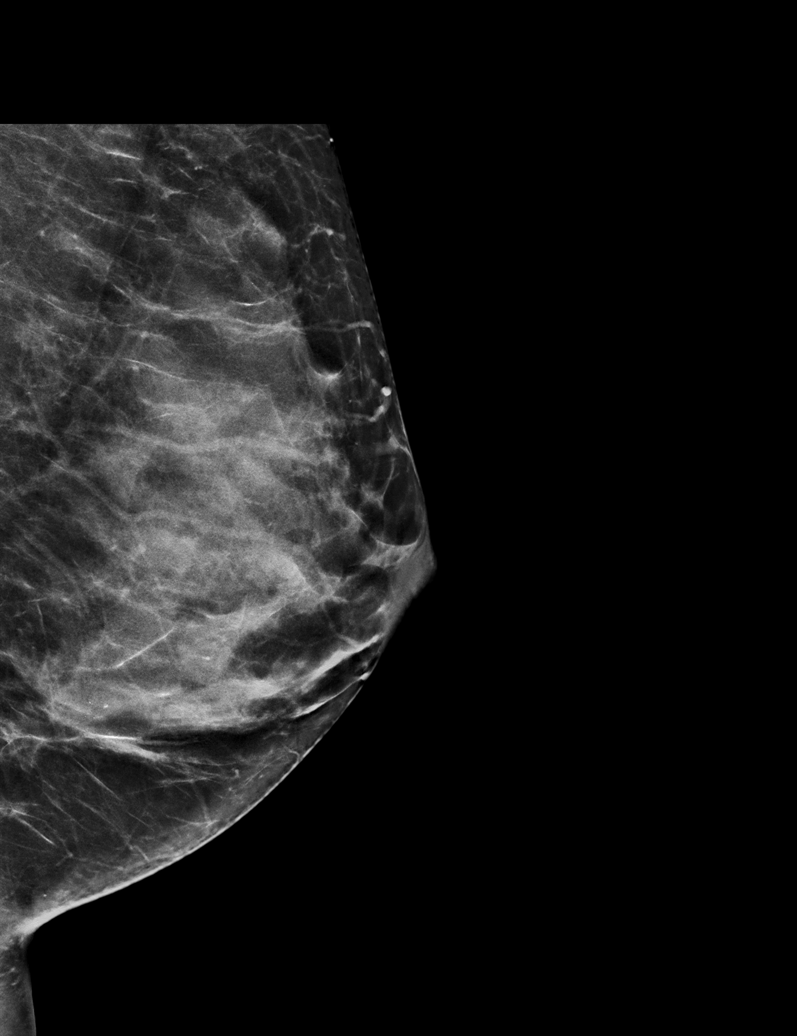

[R CC synth-2D]
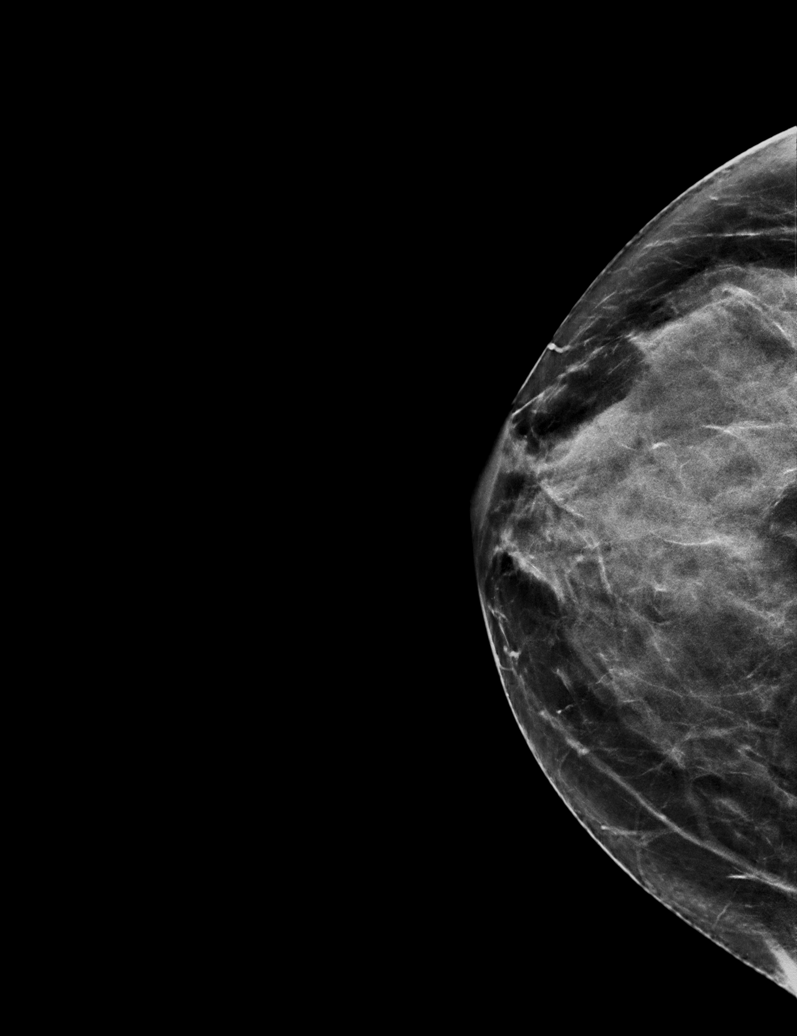

[L CC synth-2D (1 of 2)]
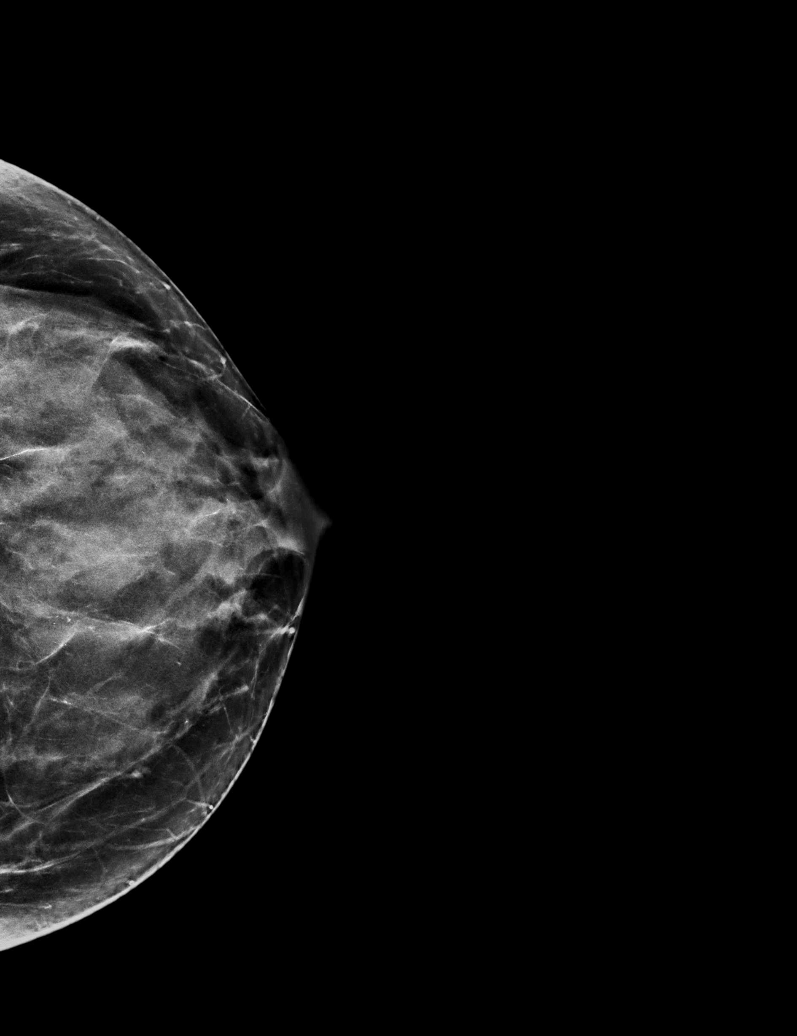

[L CC synth-2D (2 of 2)]
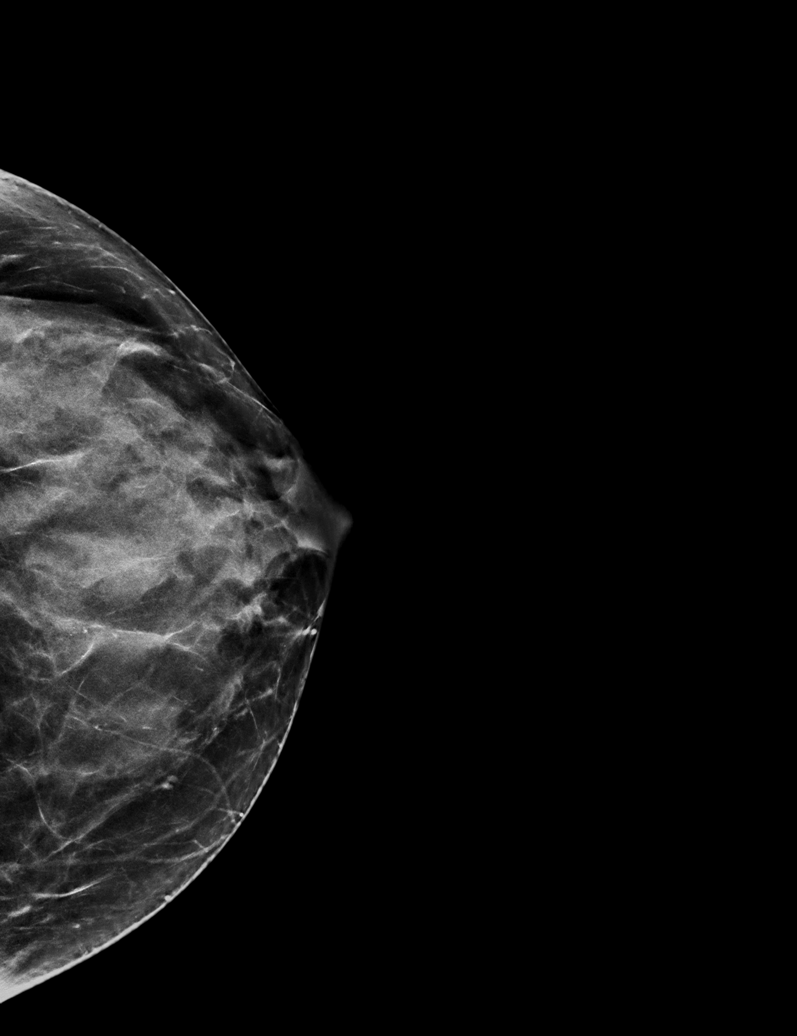

[R MLO synth-2D]
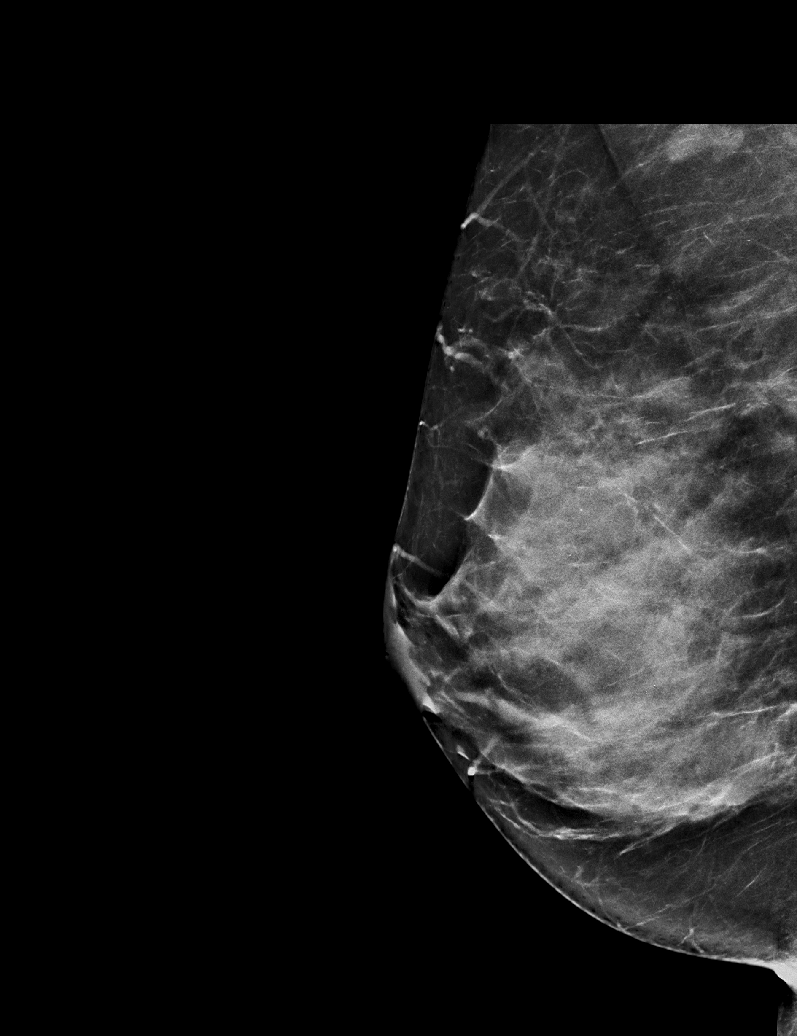

[L MLO tomo · tomo slice 33/64.0]
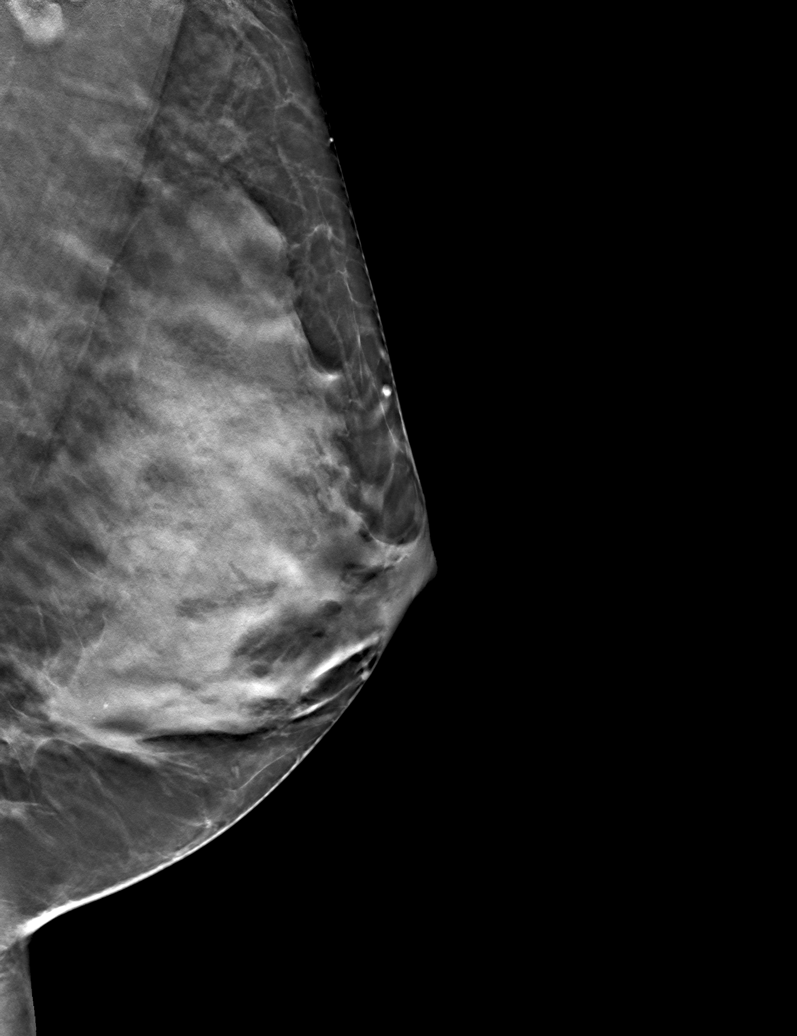

[6 of 30 positions shown; findings below may reference images not displayed]

ACR Breast Density Category c: The breast tissue is heterogeneously
dense, which may obscure small masses.
FINDINGS: In the left breast, calcifications with a possible asymmetry
warrants further evaluation. These calcifications with possible
underlying asymmetry are seen within the lower LEFT breast at
posterior depth.

In the right breast, no findings suspicious for malignancy.
IMPRESSION: Further evaluation is suggested for calcifications with a possible
asymmetry in the left breast.

RECOMMENDATION:
Diagnostic mammogram and possibly ultrasound of the left breast.
(Code:56-5-004)

The patient will be contacted regarding the findings, and additional
imaging will be scheduled.

BI-RADS CATEGORY  0: Incomplete. Need additional imaging evaluation
and/or prior mammograms for comparison.

## 2023-07-23 ENCOUNTER — Other Ambulatory Visit: Payer: Self-pay

## 2023-07-23 DIAGNOSIS — E611 Iron deficiency: Secondary | ICD-10-CM

## 2023-07-24 ENCOUNTER — Inpatient Hospital Stay: Payer: Medicaid Other | Attending: Physician Assistant

## 2023-07-24 DIAGNOSIS — D509 Iron deficiency anemia, unspecified: Secondary | ICD-10-CM | POA: Insufficient documentation

## 2023-07-24 DIAGNOSIS — E611 Iron deficiency: Secondary | ICD-10-CM

## 2023-07-24 LAB — IRON AND TIBC
Iron: 79 ug/dL (ref 28–170)
Saturation Ratios: 20 % (ref 10.4–31.8)
TIBC: 396 ug/dL (ref 250–450)
UIBC: 317 ug/dL

## 2023-07-24 LAB — CBC WITH DIFFERENTIAL/PLATELET
Abs Immature Granulocytes: 0.02 10*3/uL (ref 0.00–0.07)
Basophils Absolute: 0.1 10*3/uL (ref 0.0–0.1)
Basophils Relative: 1 %
Eosinophils Absolute: 0.1 10*3/uL (ref 0.0–0.5)
Eosinophils Relative: 1 %
HCT: 43.7 % (ref 36.0–46.0)
Hemoglobin: 12.8 g/dL (ref 12.0–15.0)
Immature Granulocytes: 0 %
Lymphocytes Relative: 39 %
Lymphs Abs: 3.3 10*3/uL (ref 0.7–4.0)
MCH: 22.3 pg — ABNORMAL LOW (ref 26.0–34.0)
MCHC: 29.3 g/dL — ABNORMAL LOW (ref 30.0–36.0)
MCV: 76.1 fL — ABNORMAL LOW (ref 80.0–100.0)
Monocytes Absolute: 0.5 10*3/uL (ref 0.1–1.0)
Monocytes Relative: 6 %
Neutro Abs: 4.5 10*3/uL (ref 1.7–7.7)
Neutrophils Relative %: 53 %
Platelets: 326 10*3/uL (ref 150–400)
RBC: 5.74 MIL/uL — ABNORMAL HIGH (ref 3.87–5.11)
RDW: 16.6 % — ABNORMAL HIGH (ref 11.5–15.5)
WBC: 8.4 10*3/uL (ref 4.0–10.5)
nRBC: 0 % (ref 0.0–0.2)

## 2023-07-24 LAB — FERRITIN: Ferritin: 42 ng/mL (ref 11–307)

## 2023-07-30 NOTE — Progress Notes (Unsigned)
VIRTUAL VISIT via TELEPHONE NOTE [facility]   I connected with Robin Delgado  on 07/31/2023 at 1:10 PM by telephone and verified that I am speaking with the correct person using two identifiers.  Location: Patient: Home Provider: [facility]   I discussed the limitations, risks, security and privacy concerns of performing an evaluation and management service by telephone and the availability of in person appointments. I also discussed with the patient that there may be a patient responsible charge related to this service. The patient expressed understanding and agreed to proceed.  REASON FOR VISIT:  Follow-up for microcytosis, iron deficiency, folic acid deficiency   PRIOR THERAPY: None   CURRENT THERAPY: Daily iron tablet + folic acid supplement  INTERVAL HISTORY:   Robin Delgado 43 y.o. female returns for routine follow-up of microcytosis, iron deficiency, and folic acid deficiency.  She was last evaluated via telemedicine visit by Rojelio Brenner PA-C on 01/24/2023.  At today's visit, she reports feeling fair apart from ongoing fatigue.  She has intermittent restless legs.  She denies any pica, chest pain, dyspnea, headaches, lightheadedness, or syncope.  She denies any rectal bleeding or melena.  She continues to have moderate menstrual bleeding for 5 out of every 28 days.   She has 25% energy and 70% appetite. She endorses that she is maintaining a stable weight.  ASSESSMENT & PLAN:  1.  Chronic microcytosis: - Microcytosis and elevated RBC since at least 2014, with MCV ranging from 69.7 to 76.8, iron proportion to hemoglobin - She reports that her sister and father have some sort of thalassemia - Hemoglobin fractionation cascade was within normal limits, but this does not exclude the possibility of alpha thalassemia trait - Alpha thalassemia genotype (01/02/2023) reveals alpha thalassemia trait. - Differential diagnosis favors combination of  suspected alpha thalassemia trait as well as moderate iron deficiency (addressed below)   - PLAN: Discussed diagnosis of alpha thalassemia trait, particularly that this is unlikely to cause her any problems but could have reproductive impact on offspring.   2.  Iron deficiency, symptomatic - Work-up of fatigue and microcytosis revealed moderate iron deficiency (ferritin 30, iron saturation 18%) and folate deficiency (folate 4.8).  B12/MMA, folate, copper normal.  Normal TSH. - She has menstrual bleeding 5 of every 28 days, moderate. - No bright red blood per rectum - She was unable to tolerate oral iron supplement due to nausea - Recommended IV iron infusion due to symptomatic iron deficiency, but patient declined due to concern regarding side effects - Most recent labs (07/24/2023): Hgb 12.8/MCV 76.1/RBC 5.74.  Ferritin 42, iron saturation 20%  - PLAN: Trial of FERROUS BISGLYCINATE to see if this is better tolerated.  - Repeat CBC/D, ferritin, iron/TIBC, folate in 6 months with office visit after   3.  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  She has 3 older children.  She is a non-smoker. - Sister and her niece have thalassemia trait.  Father died of bone cancer but could have thalassemia trait.  Maternal aunt died of leukemia.   PLAN SUMMARY:  >> Labs in 6 months = CBC/D, ferritin, iron/TIBC >> OFFICE visit in 6 months     REVIEW OF SYSTEMS:   Review of Systems  Constitutional:  Positive for malaise/fatigue. Negative for chills, diaphoresis, fever and weight loss.  Respiratory:  Negative for cough and shortness of breath.   Cardiovascular:  Positive for chest pain (at times, none today).  Negative for palpitations.  Gastrointestinal:  Negative for abdominal pain, blood in stool, melena, nausea and vomiting.  Neurological:  Negative for dizziness and headaches.  Psychiatric/Behavioral:  The patient is nervous/anxious.      PHYSICAL EXAM: (per limitations of virtual  telephone visit)  The patient is alert and oriented x 3, exhibiting adequate mentation, good mood, and ability to speak in full sentences and execute sound judgement.  WRAP UP:   I discussed the assessment and treatment plan with the patient. The patient was provided an opportunity to ask questions and all were answered. The patient agreed with the plan and demonstrated an understanding of the instructions.   The patient was advised to call back or seek an in-person evaluation if the symptoms worsen or if the condition fails to improve as anticipated.  I provided 22 minutes of non-face-to-face time during this encounter, with >10 minutes of medical discussion.  Carnella Guadalajara, PA-C 07/31/2023 1:32 PM

## 2023-07-31 ENCOUNTER — Inpatient Hospital Stay: Payer: Medicaid Other | Admitting: Physician Assistant

## 2023-07-31 DIAGNOSIS — E611 Iron deficiency: Secondary | ICD-10-CM

## 2023-07-31 DIAGNOSIS — D7589 Other specified diseases of blood and blood-forming organs: Secondary | ICD-10-CM | POA: Diagnosis not present

## 2023-11-08 ENCOUNTER — Ambulatory Visit: Admitting: *Deleted

## 2023-11-08 ENCOUNTER — Other Ambulatory Visit (HOSPITAL_COMMUNITY)
Admission: RE | Admit: 2023-11-08 | Discharge: 2023-11-08 | Disposition: A | Discharge status: Home / Self Care | Source: Ambulatory Visit | Attending: Obstetrics & Gynecology | Admitting: Obstetrics & Gynecology

## 2023-11-08 DIAGNOSIS — N898 Other specified noninflammatory disorders of vagina: Secondary | ICD-10-CM | POA: Diagnosis present

## 2023-11-08 DIAGNOSIS — M545 Low back pain, unspecified: Secondary | ICD-10-CM | POA: Diagnosis not present

## 2023-11-08 DIAGNOSIS — N939 Abnormal uterine and vaginal bleeding, unspecified: Secondary | ICD-10-CM

## 2023-11-08 LAB — POCT URINALYSIS DIPSTICK OB
Blood, UA: NEGATIVE
Glucose, UA: NEGATIVE
Ketones, UA: NEGATIVE
Leukocytes, UA: NEGATIVE
Nitrite, UA: NEGATIVE
POC,PROTEIN,UA: NEGATIVE

## 2023-11-08 NOTE — Progress Notes (Signed)
   NURSE VISIT- VAGINITIS  SUBJECTIVE:  Robin Delgado is a 43 y.o. G3P3000 GYN patientfemale here for a vaginal swab for vaginitis screening.  She reports the following symptoms: abnormal bleeding: spotting for one day on Tuesday and odor for 1 week. Thought she might have an UTI as she was having lower back pain, drank cranberry juice and pain is now gone. Denies new sexual partners.  Denies abnormal vaginal bleeding, significant pelvic pain, fever, or UTI symptoms.  OBJECTIVE:  There were no vitals taken for this visit.  Appears well, in no apparent distress  ASSESSMENT: Vaginal swab for vaginitis screening Urine dip neg  PLAN: Self-collected vaginal probe for Gonorrhea, Chlamydia, Trichomonas, Bacterial Vaginosis, Yeast sent to lab Treatment: to be determined once results are received Follow-up as needed if symptoms persist/worsen, or new symptoms develop  Laverne Potter  11/08/2023 10:03 AM

## 2023-11-11 ENCOUNTER — Other Ambulatory Visit: Payer: Self-pay | Admitting: Adult Health

## 2023-11-11 LAB — CERVICOVAGINAL ANCILLARY ONLY
Bacterial Vaginitis (gardnerella): POSITIVE — AB
Candida Glabrata: NEGATIVE
Candida Vaginitis: NEGATIVE
Chlamydia: NEGATIVE
Comment: NEGATIVE
Comment: NEGATIVE
Comment: NEGATIVE
Comment: NEGATIVE
Comment: NEGATIVE
Comment: NORMAL
Neisseria Gonorrhea: NEGATIVE
Trichomonas: NEGATIVE

## 2023-11-11 MED ORDER — METRONIDAZOLE 500 MG PO TABS: 500.0000 mg | ORAL_TABLET | Freq: Two times a day (BID) | ORAL | 0 refills | Status: DC

## 2023-11-11 NOTE — Progress Notes (Signed)
+  BV on vaginal swab will rx flagyl, no sex or alcohol while taking meds.  

## 2023-11-13 ENCOUNTER — Encounter: Payer: Self-pay | Admitting: *Deleted

## 2023-11-15 ENCOUNTER — Encounter: Payer: Self-pay | Admitting: *Deleted

## 2023-12-03 ENCOUNTER — Other Ambulatory Visit (HOSPITAL_COMMUNITY): Payer: Self-pay | Admitting: Adult Health

## 2023-12-03 DIAGNOSIS — Z1231 Encounter for screening mammogram for malignant neoplasm of breast: Secondary | ICD-10-CM

## 2023-12-19 ENCOUNTER — Encounter: Payer: Self-pay | Admitting: Adult Health

## 2023-12-19 ENCOUNTER — Encounter: Payer: Self-pay | Admitting: Hematology

## 2023-12-19 ENCOUNTER — Ambulatory Visit: Admitting: Adult Health

## 2023-12-19 ENCOUNTER — Other Ambulatory Visit (HOSPITAL_COMMUNITY)
Admission: RE | Admit: 2023-12-19 | Discharge: 2023-12-19 | Disposition: A | Discharge status: Home / Self Care | Source: Ambulatory Visit | Attending: Adult Health | Admitting: Adult Health

## 2023-12-19 VITALS — BP 176/97 | HR 67 | Ht 64.0 in | Wt 154.5 lb

## 2023-12-19 DIAGNOSIS — Z0000 Encounter for general adult medical examination without abnormal findings: Secondary | ICD-10-CM | POA: Insufficient documentation

## 2023-12-19 DIAGNOSIS — Z1331 Encounter for screening for depression: Secondary | ICD-10-CM | POA: Diagnosis not present

## 2023-12-19 DIAGNOSIS — I10 Essential (primary) hypertension: Secondary | ICD-10-CM

## 2023-12-19 DIAGNOSIS — R232 Flushing: Secondary | ICD-10-CM | POA: Diagnosis not present

## 2023-12-19 DIAGNOSIS — N926 Irregular menstruation, unspecified: Secondary | ICD-10-CM

## 2023-12-19 DIAGNOSIS — F32A Depression, unspecified: Secondary | ICD-10-CM

## 2023-12-19 DIAGNOSIS — F419 Anxiety disorder, unspecified: Secondary | ICD-10-CM

## 2023-12-19 LAB — POCT URINE PREGNANCY: Preg Test, Ur: NEGATIVE

## 2023-12-19 NOTE — Progress Notes (Signed)
 Patient ID: Robin Delgado, female   DOB: 07/15/1980, 43 y.o.   MRN: [id number] History of Present Illness: Robin Delgado is a 43 year old black female,single, G3P3000, in for a well woman gyn exam and pap. She has not taken BP meds in about 2 weeks.  PCP is Rosie Cookey at [facility]   Current Medications, Allergies, Past Medical History, Past Surgical History, Family History and Social History were reviewed in [employer] record.     Review of Systems: Patient denies any headaches, hearing loss, fatigue, blurred vision, shortness of breath, chest pain, abdominal pain, problems with bowel movements, urination, or intercourse(not currently active). No joint pain or mood swings.  +stress   Physical Exam:BP (!) 176/97 (BP Location: Right Arm, Patient Position: Sitting, Cuff Size: Normal)   Pulse 67   Ht 5' 4 (1.626 m)   Wt 154 lb 8 oz (70.1 kg)   LMP 11/10/2023 (Approximate)   BMI 26.52 kg/m   General:  Well developed, well nourished, no acute distress Skin:  Warm and dry Neck:  Midline trachea, normal thyroid , good ROM, no lymphadenopathy Lungs; Clear to auscultation bilaterally Breast:  No dominant palpable mass, retraction, or nipple discharge Cardiovascular: Regular rate and rhythm Abdomen:  Soft, non tender, no hepatosplenomegaly Pelvic:  External genitalia is normal in appearance, no lesions.  The vagina is normal in appearance. Urethra has no lesions or masses. The cervix is smooth, pap with HR HPV genotyping performed.  Uterus is felt to be normal size, shape, and contour.  No adnexal masses or tenderness noted.Bladder is non tender, no masses felt. Rectal: Deferred at her request Extremities/musculoskeletal:  No swelling or varicosities noted, no clubbing or cyanosis Psych:  No mood changes, alert and cooperative,seems happy AA is 0 Fall risk is low    12/19/2023   10:51 AM 12/06/2022   10:44 AM 11/09/2022    3:30 PM  Depression screen PHQ 2/9  Decreased  Interest 2 1 2   Down, Depressed, Hopeless 2 0 0  PHQ - 2 Score 4 1 2   Altered sleeping 2 2 0  Tired, decreased energy 3 1 3   Change in appetite 1 0 0  Feeling bad or failure about yourself  0 0 0  Trouble concentrating 1 0 0  Moving slowly or fidgety/restless 0 0 0  Suicidal thoughts 0 0 0  PHQ-9 Score 11 4 5   Difficult doing work/chores   Somewhat difficult       12/19/2023   10:51 AM 12/06/2022   10:44 AM 11/09/2022    3:30 PM 08/30/2022    9:55 AM  GAD 7 : Generalized Anxiety Score  Nervous, Anxious, on Edge 3 3 3 3   Control/stop worrying 3 3 3 3   Worry too much - different things 3 3 3 3   Trouble relaxing 3 3 3 2   Restless 1 0 0 1  Easily annoyed or irritable 3 3 3 3   Afraid - awful might happen 3 3 3 2   Total GAD 7 Score 19 18 18 17   Anxiety Difficulty   Somewhat difficult Not difficult at all      Upstream - 12/19/23 1102       Pregnancy Intention Screening   Does the patient want to become pregnant in the next year? No    Does the patient's partner want to become pregnant in the next year? No    Would the patient like to discuss contraceptive options today? No      Contraception Wrap  Up   Current Method Female Sterilization    End Method Female Sterilization    Contraception Counseling Provided No         Examination chaperoned by Alphonso Aschoff LPN   Impression and plan: 1. Routine general medical examination at a health care facility (Primary) Pap sent Pap in 3 years if negative Physical in 1 year Labs with PCP Mammogram was negative 01/15/23, has one scheduled 01/16/24 Colonoscopy or cologuard at 44   - Cytology - PAP( [place])  2. Hypertension, unspecified type Take BP meds and follow up with PCP, in August   3. Hot flashes Having some hot flashes esp  night  If gets worse let me know  4. Missed period Missed June so far Aware that may start missing periods in perimenopause phase  5. Anxiety and depression Declines meds but open to  counseling Will refer to [facility] in [place]

## 2023-12-23 ENCOUNTER — Ambulatory Visit: Payer: Self-pay | Admitting: Adult Health

## 2023-12-23 LAB — CYTOLOGY - PAP
Chlamydia: NEGATIVE
Comment: NEGATIVE
Comment: NEGATIVE
Comment: NEGATIVE
Comment: NORMAL
Diagnosis: NEGATIVE
High risk HPV: NEGATIVE
Neisseria Gonorrhea: NEGATIVE
Trichomonas: NEGATIVE

## 2023-12-23 MED ORDER — METRONIDAZOLE 500 MG PO TABS
500.0000 mg | ORAL_TABLET | Freq: Two times a day (BID) | ORAL | 0 refills | Status: DC
Start: 2023-12-23 — End: 2024-01-09

## 2024-01-09 ENCOUNTER — Other Ambulatory Visit: Payer: Self-pay | Admitting: Obstetrics & Gynecology

## 2024-01-09 MED ORDER — METRONIDAZOLE 0.75 % VA GEL: 1.0000 | Freq: Every day | VAGINAL | 0 refills | Status: AC

## 2024-01-09 NOTE — Progress Notes (Signed)
 Rx for BV- vaginal gel instead of oral pills  Eric Morganti, DO Attending Obstetrician & Gynecologist, [facility] for [employer], [facility]

## 2024-01-16 ENCOUNTER — Ambulatory Visit (HOSPITAL_COMMUNITY)
Admission: RE | Admit: 2024-01-16 | Discharge: 2024-01-16 | Disposition: A | Discharge status: Home / Self Care | Source: Ambulatory Visit | Attending: Adult Health | Admitting: Adult Health

## 2024-01-16 DIAGNOSIS — Z1231 Encounter for screening mammogram for malignant neoplasm of breast: Secondary | ICD-10-CM | POA: Insufficient documentation

## 2024-01-21 ENCOUNTER — Inpatient Hospital Stay: Payer: Medicaid Other | Attending: Physician Assistant

## 2024-01-21 ENCOUNTER — Ambulatory Visit: Payer: Self-pay | Admitting: Adult Health

## 2024-01-27 NOTE — Addendum Note (Signed)
 Addended by: Braxson Hollingsworth A on: 01/27/2024 09:13 AM   Modules accepted: Orders

## 2024-01-28 ENCOUNTER — Inpatient Hospital Stay: Payer: Medicaid Other | Admitting: Physician Assistant

## 2024-01-30 ENCOUNTER — Ambulatory Visit (INDEPENDENT_AMBULATORY_CARE_PROVIDER_SITE_OTHER)

## 2024-01-30 ENCOUNTER — Other Ambulatory Visit (HOSPITAL_COMMUNITY)
Admission: RE | Admit: 2024-01-30 | Discharge: 2024-01-30 | Disposition: A | Discharge status: Home / Self Care | Source: Ambulatory Visit | Attending: Obstetrics & Gynecology | Admitting: Obstetrics & Gynecology

## 2024-01-30 ENCOUNTER — Ambulatory Visit: Payer: Self-pay | Admitting: Obstetrics & Gynecology

## 2024-01-30 DIAGNOSIS — N898 Other specified noninflammatory disorders of vagina: Secondary | ICD-10-CM | POA: Diagnosis present

## 2024-01-30 DIAGNOSIS — R109 Unspecified abdominal pain: Secondary | ICD-10-CM | POA: Insufficient documentation

## 2024-01-30 LAB — POCT URINALYSIS DIPSTICK OB
Blood, UA: NEGATIVE
Glucose, UA: NEGATIVE
Ketones, UA: NEGATIVE
Leukocytes, UA: NEGATIVE
Nitrite, UA: NEGATIVE
POC,PROTEIN,UA: NEGATIVE

## 2024-01-30 NOTE — Progress Notes (Signed)
   NURSE VISIT- VAGINITIS/STD  SUBJECTIVE:  Robin Delgado is a 43 y.o. G3P3000 GYN patientfemale here for a vaginal swab for vaginitis screening, STD screen.  She reports the following symptoms: vulvar itching and lower abdominal pressure for a few  days. Denies abnormal vaginal bleeding, significant pelvic pain, or fever symptoms. Patient does endorse some lower abdominal pressure when urinating.  OBJECTIVE:  There were no vitals taken for this visit.  Appears well, in no apparent distress  ASSESSMENT: Vaginal swab for vaginitis screening Urine collected for culture and analysis, negative for nitrates.  PLAN: Self-collected vaginal probe for Gonorrhea, Chlamydia, Trichomonas, Bacterial Vaginosis, Yeast sent to lab Urine sent to lab as well  Treatment: to be determined once results are received Follow-up as needed if symptoms persist/worsen, or new symptoms develop  Aleck FORBES Blase  01/30/2024 10:02 AM

## 2024-01-31 LAB — CERVICOVAGINAL ANCILLARY ONLY
Bacterial Vaginitis (gardnerella): NEGATIVE
Candida Glabrata: NEGATIVE
Candida Vaginitis: NEGATIVE
Chlamydia: NEGATIVE
Comment: NEGATIVE
Comment: NEGATIVE
Comment: NEGATIVE
Comment: NEGATIVE
Comment: NEGATIVE
Comment: NORMAL
Neisseria Gonorrhea: NEGATIVE
Trichomonas: NEGATIVE

## 2024-01-31 LAB — URINALYSIS, ROUTINE W REFLEX MICROSCOPIC
Bilirubin, UA: NEGATIVE
Glucose, UA: NEGATIVE
Ketones, UA: NEGATIVE
Leukocytes,UA: NEGATIVE
Nitrite, UA: NEGATIVE
Protein,UA: NEGATIVE
RBC, UA: NEGATIVE
Specific Gravity, UA: 1.022 (ref 1.005–1.030)
Urobilinogen, Ur: 1 mg/dL (ref 0.2–1.0)
pH, UA: 6 (ref 5.0–7.5)

## 2024-02-01 LAB — URINE CULTURE

## 2024-02-07 ENCOUNTER — Encounter: Payer: Self-pay | Admitting: Oncology

## 2024-02-07 ENCOUNTER — Inpatient Hospital Stay: Attending: Physician Assistant | Admitting: Oncology

## 2024-02-07 ENCOUNTER — Other Ambulatory Visit: Payer: Self-pay

## 2024-02-07 DIAGNOSIS — D508 Other iron deficiency anemias: Secondary | ICD-10-CM

## 2024-02-07 DIAGNOSIS — R718 Other abnormality of red blood cells: Secondary | ICD-10-CM

## 2024-02-07 DIAGNOSIS — E611 Iron deficiency: Secondary | ICD-10-CM

## 2024-02-07 MED ORDER — FERROUS GLUCONATE 324 (38 FE) MG PO TABS: 324.0000 mg | ORAL_TABLET | Freq: Every day | ORAL | 3 refills | Status: DC

## 2024-02-07 NOTE — Assessment & Plan Note (Signed)
-   Work-up of fatigue and microcytosis revealed moderate iron  deficiency (ferritin 30, iron  saturation 18%) and folate deficiency (folate 4.8).  B12/MMA, folate, copper  normal.  Normal TSH. - Previously having heavy menstrual cycles but these have stopped for the past 2 to 3 months.  She thinks she is going through menopause. - No bright red blood per rectum -She was instructed to start taking an oral iron  tablet but it is says she never received this from her pharmacy. - It was recommended she begin IV iron  but she was nervous due to side effects. -We reviewed her most recent labs which showed improvement of her iron  panel.  Hemoglobin unfortunately trended down a little but everything else seems to be stable/improved. -I will send in a new prescription for iron  ferrous gluconate that she will take every other day to see if she can tolerate.  Continue iron  rich foods. -Return to clinic in 6 months with labs a few days before.

## 2024-02-07 NOTE — Progress Notes (Signed)
 [facility] OFFICE PROGRESS NOTE  [name], FNP  ASSESSMENT & PLAN:  Assessment & Plan RBC microcytosis - Microcytosis and elevated RBC since at least 2014, with MCV ranging from 69.7 to 76.8, iron  proportion to hemoglobin - She reports that her sister and father have some sort of thalassemia - Hemoglobin fractionation cascade was within normal limits, but this does not exclude the possibility of alpha thalassemia trait - Alpha thalassemia genotype (01/02/2023) reveals alpha thalassemia trait. - Differential diagnosis favors combination of suspected alpha thalassemia trait as well as moderate iron  deficiency (addressed below)    Iron  deficiency  Other iron  deficiency anemia - Work-up of fatigue and microcytosis revealed moderate iron  deficiency (ferritin 30, iron  saturation 18%) and folate deficiency (folate 4.8).  B12/MMA, folate, copper  normal.  Normal TSH. - Previously having heavy menstrual cycles but these have stopped for the past 2 to 3 months.  She thinks she is going through menopause. - No bright red blood per rectum -She was instructed to start taking an oral iron  tablet but it is says she never received this from her pharmacy. - It was recommended she begin IV iron  but she was nervous due to side effects. -We reviewed her most recent labs which showed improvement of her iron  panel.  Hemoglobin unfortunately trended down a little but everything else seems to be stable/improved. -I will send in a new prescription for iron  ferrous gluconate that she will take every other day to see if she can tolerate.  Continue iron  rich foods. -Return to clinic in 6 months with labs a few days before.     Orders Placed This Encounter  Procedures   CBC with Differential    Standing Status:   Future    Expected Date:   08/09/2024    Expiration Date:   02/06/2025   Ferritin    Standing Status:   Future    Expected Date:   08/09/2024    Expiration Date:   02/06/2025   Iron   and TIBC ([facility] ONLY)    Standing Status:   Future    Expected Date:   08/09/2024    Expiration Date:   02/06/2025    INTERVAL HISTORY: Patient returns for follow-up for iron  deficiency anemia and microcytosis.  Since her last visit, her appetite is 100% energy levels are 25%.  She has 4 out of 10 pain in her lower extremities.  She denies any hospitalizations or surgeries.  No changes to her baseline health.  She has chest pain and palpitations over the past month or so.  She has itching on her back.  Has stable anxiety and sleep problems.She denies any pica, chest pain, dyspnea, headaches, lightheadedness, or syncope.  She denies any rectal bleeding or melena.  She continues to have moderate menstrual bleeding for 5 out of every 28 days.   She has 25% energy and 70% appetite. She endorses that she is maintaining a stable weight.  Reports that her last visit she was given a prescription for iron  tablets but her pharmacy did not receive them.  She has not been taking iron  OTC.  Reports she is willing to try them but did not know which ones to start.   SUMMARY OF HEMATOLOGIC HISTORY: Review of Systems  Constitutional:  Positive for malaise/fatigue.  Cardiovascular:  Positive for chest pain and palpitations.  Skin:  Positive for itching.  Neurological:  Positive for headaches.  Psychiatric/Behavioral:  The patient is nervous/anxious and has insomnia.  Physical Exam Neurological:     Mental Status: She is alert and oriented to person, place, and time.      No results found for: CBC  There were no vitals filed for this visit.  I spent 20 minutes dedicated to the care of this patient (face-to-face and non-face-to-face) on the date of the encounter to include what is described in the assessment and plan.,  [name], NP 02/07/2024 1:30 PM

## 2024-02-07 NOTE — Assessment & Plan Note (Addendum)
-   Microcytosis and elevated RBC since at least 2014, with MCV ranging from 69.7 to 76.8, iron  proportion to hemoglobin - She reports that her sister and father have some sort of thalassemia - Hemoglobin fractionation cascade was within normal limits, but this does not exclude the possibility of alpha thalassemia trait - Alpha thalassemia genotype (01/02/2023) reveals alpha thalassemia trait. - Differential diagnosis favors combination of suspected alpha thalassemia trait as well as moderate iron  deficiency (addressed below)

## 2024-02-07 NOTE — Assessment & Plan Note (Deleted)
-   Work-up of fatigue and microcytosis revealed moderate iron  deficiency (ferritin 30, iron  saturation 18%) and folate deficiency (folate 4.8).  B12/MMA, folate, copper  normal.  Normal TSH. - She has menstrual bleeding 5 of every 28 days, moderate. - No bright red blood per rectum - She was unable to tolerate oral iron  supplement due to nausea - Recommended IV iron  infusion due to symptomatic iron  deficiency, but patient declined due to concern regarding side effects - Most recent labs (07/24/2023): Hgb 12.8/MCV 76.1/RBC 5.74.  Ferritin 42, iron  saturation 20%  - PLAN: Trial of FERROUS BISGLYCINATE to see if this is better tolerated.  - Repeat CBC/D, ferritin, iron /TIBC, folate in 6 months with office visit after

## 2024-03-03 ENCOUNTER — Inpatient Hospital Stay: Admitting: Physician Assistant

## 2024-06-24 ENCOUNTER — Other Ambulatory Visit: Payer: Self-pay | Admitting: Adult Health

## 2024-06-24 MED ORDER — METRONIDAZOLE 500 MG PO TABS: 500.0000 mg | ORAL_TABLET | Freq: Two times a day (BID) | ORAL | 0 refills | Status: DC

## 2024-06-24 NOTE — Progress Notes (Signed)
 Rx flagyl

## 2024-07-14 ENCOUNTER — Encounter: Payer: Self-pay | Admitting: Adult Health

## 2024-07-14 ENCOUNTER — Ambulatory Visit (INDEPENDENT_AMBULATORY_CARE_PROVIDER_SITE_OTHER): Admitting: Adult Health

## 2024-07-14 VITALS — BP 161/97 | HR 83 | Ht 64.0 in | Wt 162.5 lb

## 2024-07-14 DIAGNOSIS — R232 Flushing: Secondary | ICD-10-CM | POA: Diagnosis not present

## 2024-07-14 DIAGNOSIS — N926 Irregular menstruation, unspecified: Secondary | ICD-10-CM | POA: Diagnosis not present

## 2024-07-14 DIAGNOSIS — Z3202 Encounter for pregnancy test, result negative: Secondary | ICD-10-CM | POA: Diagnosis not present

## 2024-07-14 DIAGNOSIS — I10 Essential (primary) hypertension: Secondary | ICD-10-CM

## 2024-07-14 DIAGNOSIS — N951 Menopausal and female climacteric states: Secondary | ICD-10-CM | POA: Diagnosis not present

## 2024-07-14 LAB — POCT URINE PREGNANCY: Preg Test, Ur: NEGATIVE

## 2024-07-14 NOTE — Progress Notes (Signed)
 " Subjective:     Patient ID: Robin Delgado, female   DOB: [birth year], 44 y.o.   MRN: [id number]  HPI Robin Delgado is a 44 year old black female, single, G3P3, in complaining of having missed period from May til  December, then bleed for 3 weeks, had hot flashes in May, but not now. Denies any night sweats, and is moody. She stopped taking BP meds.     Component Value Date/Time   DIAGPAP  12/19/2023 1105    - Negative for intraepithelial lesion or malignancy (NILM)   DIAGPAP  11/29/2020 1041    - Negative for Intraepithelial Lesions or Malignancy (NILM)   DIAGPAP - Benign reactive/reparative changes 11/29/2020 1041   HPVHIGH Negative 12/19/2023 1105   HPVHIGH Negative 11/29/2020 1041   ADEQPAP  12/19/2023 1105    Satisfactory for evaluation; transformation zone component PRESENT.   ADEQPAP  11/29/2020 1041    Satisfactory for evaluation; transformation zone component ABSENT.   ADEQPAP  10/23/2017 0000    Satisfactory for evaluation  endocervical/transformation zone component PRESENT.    PCP is Maeola Ee NP Review of Systems  +missed period from May til  December, then bleed for 3 weeks,  had hot flashes in May, but not now.  Denies any night sweats, and is moody.    Reviewed past medical,surgical, social and family history. Reviewed medications and allergies.  Objective:   Physical Exam BP (!) 161/97 (BP Location: Left Arm, Patient Position: Sitting, Cuff Size: Normal)   Pulse 83   Ht 5' 4 (1.626 m)   Wt 162 lb 8 oz (73.7 kg)   LMP 06/21/2024 (Approximate)   BMI 27.89 kg/m  UPT is negative Skin warm and dry. Lungs: clear to ausculation bilaterally. Cardiovascular: regular rate and rhythm. Pelvic: external genitalia is normal in appearance no lesions, vagina: white discharge without odor,urethra has no lesions or masses noted, cervix:smooth and bulbous, uterus: normal size, shape and contour, non tender, no masses felt, adnexa: no masses or tenderness noted. Bladder is non  tender and no masses felt.     Fall risk is low  Upstream - 07/14/24 1558       Pregnancy Intention Screening   Does the patient want to become pregnant in the next year? No    Does the patient's partner want to become pregnant in the next year? No    Would the patient like to discuss contraceptive options today? No      Contraception Wrap Up   Current Method Female Sterilization    End Method Female Sterilization    Contraception Counseling Provided No         Examination chaperoned by Clarita Salt LPN  Assessment:     1. Negative pregnancy test - POCT urine pregnancy  2. Irregular periods Discussed can skip periods then have when in perimenopause   3. Hypertension, unspecified type She stopped Norvasc 10 mg for no reason, other than does not like taking meds Stressed importance of taking, she will start back she says and follow up with PCP next month as scheduled  4. Hot flashes Had hot flash in May, but not now  5. Missed periods (Primary)  +missed period from May til  December, then bleed for 3 weeks Discussed could cycle every 3 months with provera, to not have bleeding for 3 weeks when has period, but she will keep period log and follow up in 3 months for ROS   6. Perimenopause  Review handout     Plan:  Follow up about 10/15/24 for ROS     "

## 2024-07-22 ENCOUNTER — Other Ambulatory Visit (HOSPITAL_COMMUNITY)
Admission: RE | Admit: 2024-07-22 | Discharge: 2024-07-22 | Disposition: A | Discharge status: Home / Self Care | Source: Ambulatory Visit | Attending: Obstetrics & Gynecology | Admitting: Obstetrics & Gynecology

## 2024-07-22 ENCOUNTER — Ambulatory Visit

## 2024-07-22 DIAGNOSIS — N939 Abnormal uterine and vaginal bleeding, unspecified: Secondary | ICD-10-CM

## 2024-07-22 DIAGNOSIS — N898 Other specified noninflammatory disorders of vagina: Secondary | ICD-10-CM | POA: Diagnosis present

## 2024-07-22 NOTE — Progress Notes (Signed)
" ° °  NURSE VISIT- VAGINITIS  SUBJECTIVE:  Robin Delgado is a 44 y.o. [id number] GYN patientfemale here for a vaginal swab for vaginitis screening.  She reports the following symptoms: abnormal bleeding: spotting, local irritation, and vulvar itching for. Denies abnormal vaginal bleeding, significant pelvic pain, fever, or UTI symptoms.  OBJECTIVE:  LMP 06/21/2024 (Approximate)   Appears well, in no apparent distress  ASSESSMENT: Vaginal swab for vaginitis screening  PLAN: Self-collected vaginal probe for Gonorrhea, Chlamydia, Trichomonas, Bacterial Vaginosis, Yeast sent to lab Treatment: to be determined once results are received Follow-up as needed if symptoms persist/worsen, or new symptoms develop  Robin Delgado  07/22/2024 3:29 PM  "

## 2024-07-24 ENCOUNTER — Ambulatory Visit: Payer: Self-pay | Admitting: Adult Health

## 2024-07-24 LAB — CERVICOVAGINAL ANCILLARY ONLY
Bacterial Vaginitis (gardnerella): NEGATIVE
Candida Glabrata: NEGATIVE
Candida Vaginitis: POSITIVE — AB
Chlamydia: NEGATIVE
Comment: NEGATIVE
Comment: NEGATIVE
Comment: NEGATIVE
Comment: NEGATIVE
Comment: NEGATIVE
Comment: NORMAL
Neisseria Gonorrhea: NEGATIVE
Trichomonas: NEGATIVE

## 2024-07-24 MED ORDER — FLUCONAZOLE 150 MG PO TABS: ORAL_TABLET | ORAL | 1 refills | Status: AC

## 2024-07-26 ENCOUNTER — Other Ambulatory Visit: Payer: Self-pay | Admitting: Nurse Practitioner

## 2024-08-04 ENCOUNTER — Inpatient Hospital Stay: Payer: Self-pay | Attending: Physician Assistant

## 2024-08-05 ENCOUNTER — Other Ambulatory Visit

## 2024-08-11 ENCOUNTER — Inpatient Hospital Stay: Payer: Self-pay | Admitting: Oncology

## 2024-08-11 NOTE — Assessment & Plan Note (Addendum)
-   Work-up of fatigue and microcytosis revealed moderate iron  deficiency (ferritin 30, iron  saturation 18%) and folate deficiency (folate 4.8).  B12/MMA, folate, copper  normal.  Normal TSH. - Previously having heavy menstrual cycles but these have stopped for the past 2 to 3 months.  She thinks she is going through menopause. - No bright red blood per rectum -She was instructed to start taking an oral iron  tablet but it is says she never received this from her pharmacy. - It was recommended she begin IV iron  but she was nervous due to side effects. -We reviewed her most recent labs which showed improvement of her iron  panel.  Hemoglobin unfortunately trended down a little but everything else seems to be stable/improved. -I will send in a new prescription for iron  ferrous gluconate that she will take every other day to see if she can tolerate.  Continue iron  rich foods. -Return to clinic in 6 months with labs a few days before.

## 2024-08-11 NOTE — Progress Notes (Unsigned)
" ° °  [facility] OFFICE PROGRESS NOTE  [name], FNP  ASSESSMENT & PLAN:  Assessment & Plan RBC microcytosis - Microcytosis and elevated RBC since at least 2014, with MCV ranging from 69.7 to 76.8, iron  proportion to hemoglobin - She reports that her sister and father have some sort of thalassemia - Hemoglobin fractionation cascade was within normal limits, but this does not exclude the possibility of alpha thalassemia trait - Alpha thalassemia genotype (01/02/2023) reveals alpha thalassemia trait. - Differential diagnosis favors combination of suspected alpha thalassemia trait as well as moderate iron  deficiency (addressed below)    Other iron  deficiency anemia - Work-up of fatigue and microcytosis revealed moderate iron  deficiency (ferritin 30, iron  saturation 18%) and folate deficiency (folate 4.8).  B12/MMA, folate, copper  normal.  Normal TSH. - Previously having heavy menstrual cycles but these have stopped for the past 2 to 3 months.  She thinks she is going through menopause. - No bright red blood per rectum -She was instructed to start taking an oral iron  tablet but it is says she never received this from her pharmacy. - It was recommended she begin IV iron  but she was nervous due to side effects. -We reviewed her most recent labs which showed improvement of her iron  panel.  Hemoglobin unfortunately trended down a little but everything else seems to be stable/improved. -I will send in a new prescription for iron  ferrous gluconate  that she will take every other day to see if she can tolerate.  Continue iron  rich foods. -Return to clinic in 6 months with labs a few days before.     No orders of the defined types were placed in this encounter.   INTERVAL HISTORY: Patient returns for follow-up for iron  deficiency anemia and microcytosis.  Since her last visit, her appetite is 100% energy levels are 25%.  She has 4 out of 10 pain in her lower extremities.  She  denies any hospitalizations or surgeries.  No changes to her baseline health.  She has chest pain and palpitations over the past month or so.  She has itching on her back.  Has stable anxiety and sleep problems.She denies any pica, chest pain, dyspnea, headaches, lightheadedness, or syncope.  She denies any rectal bleeding or melena.  She continues to have moderate menstrual bleeding for 5 out of every 28 days.   She has 25% energy and 70% appetite. She endorses that she is maintaining a stable weight.  Reports that her last visit she was given a prescription for iron  tablets but her pharmacy did not receive them.  She has not been taking iron  OTC.  Reports she is willing to try them but did not know which ones to start.   SUMMARY OF HEMATOLOGIC HISTORY: Review of Systems  Constitutional:  Positive for malaise/fatigue.  Cardiovascular:  Positive for chest pain and palpitations.  Skin:  Positive for itching.  Neurological:  Positive for headaches.  Psychiatric/Behavioral:  The patient is nervous/anxious and has insomnia.    Physical Exam Neurological:     Mental Status: She is alert and oriented to person, place, and time.      No results found for: CBC  There were no vitals filed for this visit.  I spent 20 minutes dedicated to the care of this patient (face-to-face and non-face-to-face) on the date of the encounter to include what is described in the assessment and plan.,  [name], NP 08/11/2024 10:18 AM "

## 2024-08-11 NOTE — Assessment & Plan Note (Addendum)
-   Microcytosis and elevated RBC since at least 2014, with MCV ranging from 69.7 to 76.8, iron  proportion to hemoglobin - She reports that her sister and father have some sort of thalassemia - Hemoglobin fractionation cascade was within normal limits, but this does not exclude the possibility of alpha thalassemia trait - Alpha thalassemia genotype (01/02/2023) reveals alpha thalassemia trait. - Differential diagnosis favors combination of suspected alpha thalassemia trait as well as moderate iron  deficiency (addressed below)

## 2024-10-15 ENCOUNTER — Ambulatory Visit: Admitting: Adult Health
